# Patient Record
Sex: Female | Born: 1955 | Race: White
Health system: Northeastern US, Academic
[De-identification: ages and names within clinical notes are randomized; demographics above are authoritative.]

## PROBLEM LIST (undated history)

## (undated) DIAGNOSIS — M1990 Unspecified osteoarthritis, unspecified site: Secondary | ICD-10-CM

## (undated) DIAGNOSIS — R42 Dizziness and giddiness: Secondary | ICD-10-CM

## (undated) DIAGNOSIS — I341 Nonrheumatic mitral (valve) prolapse: Secondary | ICD-10-CM

## (undated) DIAGNOSIS — I607 Nontraumatic subarachnoid hemorrhage from unspecified intracranial artery: Secondary | ICD-10-CM

## (undated) DIAGNOSIS — F32A Depression, unspecified: Secondary | ICD-10-CM

## (undated) DIAGNOSIS — G4730 Sleep apnea, unspecified: Secondary | ICD-10-CM

## (undated) DIAGNOSIS — K648 Other hemorrhoids: Secondary | ICD-10-CM

## (undated) DIAGNOSIS — R55 Syncope and collapse: Secondary | ICD-10-CM

## (undated) DIAGNOSIS — Z9889 Other specified postprocedural states: Secondary | ICD-10-CM

## (undated) DIAGNOSIS — R112 Nausea with vomiting, unspecified: Secondary | ICD-10-CM

## (undated) DIAGNOSIS — N390 Urinary tract infection, site not specified: Secondary | ICD-10-CM

## (undated) DIAGNOSIS — D126 Benign neoplasm of colon, unspecified: Secondary | ICD-10-CM

## (undated) DIAGNOSIS — T8859XA Other complications of anesthesia, initial encounter: Secondary | ICD-10-CM

## (undated) HISTORY — DX: Urinary tract infection, site not specified: N39.0

## (undated) HISTORY — DX: Nonrheumatic mitral (valve) prolapse: I34.1

## (undated) HISTORY — DX: Nontraumatic subarachnoid hemorrhage from unspecified intracranial artery: I60.7

## (undated) HISTORY — DX: Benign neoplasm of colon, unspecified: D12.6

## (undated) HISTORY — DX: Other hemorrhoids: K64.8

## (undated) HISTORY — DX: Sleep apnea, unspecified: G47.30

## (undated) HISTORY — DX: Unspecified osteoarthritis, unspecified site: M19.90

## (undated) HISTORY — PX: OTHER SURGICAL HISTORY: SHX169

## (undated) HISTORY — DX: Depression, unspecified: F32.A

---

## 2003-04-05 DIAGNOSIS — G43909 Migraine, unspecified, not intractable, without status migrainosus: Secondary | ICD-10-CM

## 2003-04-05 DIAGNOSIS — L719 Rosacea, unspecified: Secondary | ICD-10-CM

## 2003-04-05 DIAGNOSIS — I341 Nonrheumatic mitral (valve) prolapse: Secondary | ICD-10-CM | POA: Insufficient documentation

## 2003-04-05 DIAGNOSIS — E663 Overweight: Secondary | ICD-10-CM | POA: Insufficient documentation

## 2003-04-05 HISTORY — DX: Migraine, unspecified, not intractable, without status migrainosus: G43.909

## 2003-04-05 HISTORY — DX: Rosacea, unspecified: L71.9

## 2007-02-22 DIAGNOSIS — G4730 Sleep apnea, unspecified: Secondary | ICD-10-CM | POA: Insufficient documentation

## 2010-07-01 ENCOUNTER — Encounter: Payer: Self-pay | Admitting: Gastroenterology

## 2010-07-01 ENCOUNTER — Emergency Department
Admission: EM | Admit: 2010-07-01 | Disposition: A | Discharge status: Home / Self Care | Payer: Self-pay | Source: Ambulatory Visit | Attending: Emergency Medicine | Admitting: Emergency Medicine

## 2010-07-01 ENCOUNTER — Ambulatory Visit: Payer: Self-pay | Admitting: Primary Care

## 2010-07-01 ENCOUNTER — Encounter: Payer: Self-pay | Admitting: Emergency Medicine

## 2010-07-01 HISTORY — DX: Syncope and collapse: R55

## 2010-07-01 LAB — URINE MICROSCOPIC (IQ200)
RBC,UA: <1 /HPF (ref 0–2)
WBC,UA: 4 /HPF (ref 0–5)

## 2010-07-01 LAB — URINALYSIS WITH REFLEX TO MICROSCOPIC
Blood,UA: NEGATIVE
Blood,UA: NEGATIVE
Ketones, UA: NEGATIVE
Ketones, UA: NEGATIVE
Leuk Esterase,UA: NEGATIVE
Nitrite,UA: POSITIVE — AB
Nitrite,UA: POSITIVE — AB
Protein,UA: NEGATIVE mg/dL
Protein,UA: NEGATIVE mg/dL
Specific Gravity,UA: 1.016 (ref 1.002–1.030)
Specific Gravity,UA: 1.01 (ref 1.002–1.030)
pH,UA: 6 (ref 5.0–8.0)
pH,UA: 5.5 (ref 5.0–8.0)

## 2010-07-01 LAB — CBC AND DIFFERENTIAL
Baso # K/uL: 0 THOU/uL (ref 0.0–0.1)
Basophil %: 0.3 % (ref 0.1–1.2)
Eos # K/uL: 0.1 THOU/uL (ref 0.0–0.4)
Eosinophil %: 1.8 % (ref 0.7–5.8)
Hematocrit: 40 % (ref 34–45)
Hemoglobin: 12.6 g/dL (ref 11.2–15.7)
Lymph # K/uL: 1.6 THOU/uL (ref 1.2–3.7)
Lymphocyte %: 20.7 % (ref 19.3–51.7)
MCV: 83 fL (ref 79–95)
Mono # K/uL: 0.5 THOU/uL (ref 0.2–0.9)
Monocyte %: 7 % (ref 4.7–12.5)
Neut # K/uL: 5.4 THOU/uL (ref 1.6–6.1)
Platelets: 252 THOU/uL (ref 160–370)
RBC: 4.8 MIL/uL (ref 3.9–5.2)
RDW: 15.5 % — ABNORMAL HIGH (ref 11.7–14.4)
Seg Neut %: 70.2 % (ref 34.0–71.1)
WBC: 7.7 THOU/uL (ref 4.0–10.0)

## 2010-07-01 LAB — RUQ PANEL (ED ONLY)
ALT: 19 U/L (ref 0–35)
AST: 15 U/L (ref 0–35)
Albumin: 4.2 g/dL (ref 3.5–5.2)
Alk Phos: 51 U/L (ref 35–105)
Amylase: 51 U/L (ref 28–100)
Bilirubin,Direct: <0.2 mg/dL (ref 0.0–0.3)
Bilirubin,Total: 0.4 mg/dL (ref 0.0–1.2)
Lipase: 31 U/L (ref 13–60)
Total Protein: 7.3 g/dL (ref 6.3–7.7)

## 2010-07-01 LAB — BASIC METABOLIC PANEL
Anion Gap: 12 (ref 7–16)
CO2: 24 mmol/L (ref 20–28)
Calcium: 8.5 mg/dL — ABNORMAL LOW (ref 8.6–10.2)
Chloride: 102 mmol/L (ref 96–108)
Creatinine: 0.59 mg/dL (ref 0.51–0.95)
GFR,Black: >59 *
GFR,Caucasian: >59 *
Glucose: 99 mg/dL (ref 74–106)
Lab: 13 mg/dL (ref 6–20)
Potassium: 4.2 mmol/L (ref 3.3–5.1)
Sodium: 138 mmol/L (ref 133–145)

## 2010-07-01 LAB — PROTIME-INR
INR: 0.9 (ref 0.9–1.1)
Protime: 12.2 s (ref 11.9–14.7)

## 2010-07-01 LAB — PREGNANCY, URINE: Preg Test,UR: NEGATIVE m[IU]/mL

## 2010-07-01 LAB — APTT: aPTT: 26.2 s (ref 22.3–35.3)

## 2010-07-01 LAB — TYPE AND SCREEN
ABO RH Blood Type: O POS
Antibody Screen: NEGATIVE

## 2010-07-01 MED ORDER — ONDANSETRON HCL 2 MG/ML IV SOLN *I*
INTRAMUSCULAR | Status: AC
Start: 2010-07-01 — End: 2010-07-01
  Administered 2010-07-01: 4 mg via INTRAVENOUS
  Filled 2010-07-01: qty 2

## 2010-07-01 MED ORDER — OXYCODONE-ACETAMINOPHEN 5-325 MG PO TABS *I*
1.0000 | ORAL_TABLET | ORAL | Status: AC | PRN
Start: 2010-07-01 — End: 2010-07-11

## 2010-07-01 MED ORDER — CIPROFLOXACIN HCL 500 MG PO TABS *I*
500.0000 mg | ORAL_TABLET | Freq: Two times a day (BID) | ORAL | Status: AC
Start: 2010-07-01 — End: 2010-07-04

## 2010-07-01 MED ORDER — SODIUM CHLORIDE 0.9 % IV BOLUS *I*
1000.0000 mL | Freq: Once | Status: DC
Start: 2010-07-01 — End: 2010-07-02
  Administered 2010-07-01 (×3): 1000 mL via INTRAVENOUS

## 2010-07-01 MED ORDER — OXYCODONE-ACETAMINOPHEN 5-325 MG PO TABS *I*
2.0000 | ORAL_TABLET | Freq: Once | ORAL | Status: AC
Start: 2010-07-01 — End: 2010-07-01
  Administered 2010-07-01: 2 via ORAL

## 2010-07-01 MED ORDER — CIPROFLOXACIN IN D5W 0.2 % IV SOLN *A*
400.0000 mg | Freq: Once | INTRAVENOUS | Status: DC
Start: 2010-07-01 — End: 2010-07-02
  Administered 2010-07-01: 400 mg via INTRAVENOUS
  Filled 2010-07-01: qty 200

## 2010-07-01 MED ORDER — PROMETHAZINE HCL 25 MG/ML IJ SOLN *I*
25.0000 mg | Freq: Once | INTRAMUSCULAR | Status: AC
Start: 2010-07-01 — End: 2010-07-01
  Administered 2010-07-01: 25 mg via INTRAVENOUS
  Filled 2010-07-01: qty 1

## 2010-07-01 MED ORDER — ONDANSETRON HCL 2 MG/ML IV SOLN *I*
4.0000 mg | Freq: Once | INTRAMUSCULAR | Status: AC
Start: 2010-07-01 — End: 2010-07-01

## 2010-07-01 MED ORDER — HYDROMORPHONE HCL 1 MG/ML IJ SOLN
1.0000 mg | Freq: Once | INTRAMUSCULAR | Status: AC
Start: 2010-07-01 — End: 2010-07-01
  Administered 2010-07-01: 1 mg via INTRAVENOUS
  Filled 2010-07-01: qty 1

## 2010-07-01 MED ORDER — OXYCODONE-ACETAMINOPHEN 5-325 MG PO TABS *I*
ORAL_TABLET | ORAL | Status: DC
Start: 2010-07-01 — End: 2010-07-02
  Filled 2010-07-01: qty 2

## 2010-07-01 NOTE — Progress Notes (Addendum)
 Reason For Visit   [name] is here for abdominal discomfort for 12 days duration, pain is   severe, with rash. [email or web address] lpn.  HPI   She reported: No fever, no chills, and no recent weight loss.  No chest   pain or discomfort.  No dyspnea.  No heartburn, no vomiting, no abdominal   swelling, no change in stool, no melena, no hematochezia, no diarrhea, and   no constipation.  No hematuria, no dysuria, and no burning sensation during   urination.  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  Allergies   Codeine Derivatives; Other.  Current Meds   Advil CAPS;TAKE AS DIRECTED.; RPT  Doxycycline Hyclate CAPS;TAKE AS DIRECTED.; RPT  Periostat CAPS;TAKE 1 CAPSULE DAILY; RPT.  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  ROS   Systemic symptoms: Not feeling fine.  Gastrointestinal symptoms: Decreased appetite.  No nausea.  Abdominal pain.    No epigastric pain  and not in the RUQ.  LUQ abdominal pain.  No RLQ   abdominal pain.  LLQ abdominal pain.  No periumbilical pain  and not   suprapubic.  No radiating abdominal pain.  Genitourinary symptoms: No urinary symptoms.  Vital Signs   Recorded by [email or web address] on 01 Jul 2010 08:07 AM  BP:132/86,  LUE,  Sitting,   HR: 72 b/min,  R Radial, Regular,   Resp: 16 r/min, Normal.  Physical Exam   Abdomen was soft there was exquisite  tenderness left upper abdomen and left   lower abdomen to palpation no focal mass.  The and was almost in tears on   palpation.  There is faint rash present but not vesicular nor  in a group   pattern nor in a dermatomal pattern to suggest shingles.  General appearance:    In acute distress.   Well-appearing.  Lungs:   Clear to auscultation.   No wheezing was heard.   No rhonchi were heard.  Cardiovascular system:  Heart Rate And Rhythm:  Normal.  Assessment   Pleasant female with no fever, temperature was 98.7, presentingwith 12 days   of abdominal pain worse over the last 12 to 18 hours to the point where she   is tearful every bump in the car caused discomfort urinalysis did show   nitrates blood leukoesterase negative and she has no urinary symptoms.  She   did have a bowel movement yesterday no change in bowel habits question of   possible diverticulitis or other,no fever present.  Patient referred to   [facility] for further evaluation and potential CT scan etc. and   pain control.  Communication nurse was at [facility] notified.  Greater than   50% of this 30 minute visit was spent counseling coordination of care    regarding this  issue as seen in my assessment and plan.  Thank you.  Signature   Electronically signed by: Tressie Stalker  M.D.; 07/01/2010 9:07 AM EST.  Electronically signed by: Tressie Stalker  M.D.; 07/01/2010 9:26 AM EST.

## 2010-07-01 NOTE — ED Provider Notes (Addendum)
 History   Chief Complaint   Patient presents with   . Abdominal Pain       HPI Comments: Kajuana Shareef is a 55 y.o. female presenting with L sided abdominal pain, L back / flank pain. Started 10 days prior. Initially thought pulled muscle and pain initially resolved with advil and heat pad, but then came back despite therapy. Pain remains L sided, flank and LLQ, pain always present, but waxes and wanes. No fevers / chills. No N/V/D. No urinary symptoms. BM's normal. Rash L flank starting Saturday after heating pad use. No hematuria.       Past Medical History   Diagnosis Date   . Migraine    . Syncope    . Aneurysm          Past Surgical History   Procedure Date   . Strabismus correction    . Cesarean section, classic          Family History   Problem Relation Age of Onset   . Cancer Mother    . Stroke Father    . Heart disease Father           reports that she has never smoked. She does not have any smokeless tobacco history on file.  She reports that she drinks alcohol.  She reports that she does not currently use illicit drugs.    Review of Systems   Review of Systems   Gastrointestinal: Positive for abdominal pain.   Genitourinary: Positive for flank pain.   Musculoskeletal: Positive for back pain.   All other systems reviewed and are negative.        Physical Exam   BP 191/83  Pulse 87  Temp(Src) 36.4 C (97.5 F) (Tympanic)  Resp 18  Ht 1.651 m (5\' 5" )  Wt 113.399 kg (250 lb)  BMI 41.60 kg/m2  SpO2 100%    Physical Exam   Nursing note and vitals reviewed.  Constitutional: She appears well-developed and well-nourished.        uncomfortable   HENT:   Head: Normocephalic and atraumatic.   Eyes: Conjunctivae are normal.   Neck: Normal range of motion. Neck supple. No tracheal deviation present.   Cardiovascular: Normal rate, regular rhythm and normal heart sounds.    Pulmonary/Chest: Effort normal and breath sounds normal. No respiratory distress. She has no wheezes. She has no rales.    Abdominal: Soft. Bowel sounds are normal. She exhibits no distension and no mass. Tenderness is present. She has no rebound and no guarding.        TTP LLQ.    Musculoskeletal: Normal range of motion. She exhibits edema. She exhibits no tenderness.        Trace BL LE edema   Neurological: She is alert.   Skin: Skin is warm and dry. Rash noted. There is erythema.        Macular erythematous rash L flank   Psychiatric: She has a normal mood and affect.       Medical Decision Making   MDM  Number of Diagnoses or Management Options  Diagnosis management comments: Patient seen by me today, 07/01/2010 at 10:17 AM    Assessment:  55 y.o., female comes to the ED with L sided abd pain / flank pain.   Differential Diagnosis includes diverticulitis, UTI, Stone, pregnancy, ovarian cyst  Plan: Labs, UA, Urine preg, CT abd, symptomatic therapy, reassess.          Langston Masker, MD  Patient seen  by me today, 07/01/2010 at 1030    History:   I reviewed this patient, reviewed the resident note and agree  Exam:   I examined this patient, reviewed the resident note and agree    Decision Making:   I discussed with the documented resident decision making  and agree          Author Joanna Puff, MD    Joanna Puff, MD  07/01/10 1052

## 2010-07-01 NOTE — ED Notes (Signed)
 Initial evaluation performed at [facility].  LABS, CT and ANALGESIA ordered.  Pt requires further evaluation.  55 year old female who presents with LUQ and LLQ abdominal pain, wrapping around into her back.  States has been having ongoing issues with back/side discomfort for the past few weeks, now severe x 12 hours.  No nausea/vomiting, diarrhea or constipation.  No fevers or urinary symptoms.  Saw PCP who referred her here for further evaluation.  Tearful in triage and appears to be in pain.  Ordered CT abd/pelvis, labs, urine, analgesics and saline.

## 2010-07-01 NOTE — ED Notes (Signed)
 ED RN INTERN ATTESTATION       I [name], RN (RN) reviewed the following charting information by the RN intern:DE    Nursing Assessments  Medications  Plan of Care  Teaching   Notes    In the chart of [name] (55 y.o. female) and attest to the charting being accurate.

## 2010-07-01 NOTE — ED Notes (Signed)
 Pt comes to ED complains of pain that starts in her left back and radiates around to her left abdomen which began approximately 10 days ago.  Pt is tender on palpation of left side of abdomen and makes no complaints of dizziness, light headedness, nausea or vomiting.  Pt states she has been "taking a lot of advil lately" but today it has not helped her pain.  Pt with IV placed, labs collected, NS bolus infusing and CT contrast at the bedside.  Pt was encouraged to continue to drink the contrast prior to testing.    Pt resting comfortably with husband at the bedside.

## 2010-07-01 NOTE — Discharge Instructions (Signed)
You were seen in the ED for left flank/abdominal pain.     Additional Information:     Summary of Procedures: None     Summary of Diagnostic Tests: Your CT scan, Ultrasound done in the ED showed gallstones and possible right ovarian functional cyst.       Take Antibiotics as prescribed for possible urinary tract infection and Percocet as prescribed for pain as needed. You should drink plenty of water and strain each time when you urinate.   Avoid lifting any heavy objects. Please come back to the ED immediately if symptoms gets worse, otherwise follow up with PCP in 1-2 days

## 2010-07-01 NOTE — ED Notes (Signed)
 L SIDED ABD. PAIN ON AND OFF FOR THERE PAST 10 DAYS.DENIES EMESIS OR DIARRHEA.

## 2010-07-01 NOTE — ED Notes (Addendum)
 Assumed care of pt. Introduced myself to pt and asked how she is feeling. Pt states she is starting to feel pain in her back again, however, pt quickly falls asleep after she answers questions. Respirations easy, regular. Will continue to monitor.

## 2010-07-01 NOTE — ED Notes (Signed)
 Bed:PA-01<BR> Expected date:07/01/10<BR> Expected time: 9:30 AM<BR> Means of arrival: Car<BR> Comments:<BR> ADULT SERVICE-----  Hamad, Jonesha-----  DOB [birth year]-----  [address]---PT WITH L ABD PAIN X 12 DAYS--HAS MILD RASH--PAIN WORSE OVER LAST 12-18 HRS-----NEEDS SCAN &amp; BLOOD WORK--------PM

## 2010-07-01 NOTE — ED Notes (Signed)
 Pt actively vomiting again despite IV phenergan given. Pt medicated per order before ultrasound. Pt states she only become nauseous when she gets up to move.will continue too monitor and treat per order.

## 2010-07-01 NOTE — ED Notes (Signed)
 ED RN INTERN ATTESTATION       I Victoria Dublin, RN (RN) reviewed the following charting information by the RN intern: HS    Nursing Assessments  Medications  Plan of Care  Teaching   Notes    In the chart of Irania [name] (55 y.o. female) and attest to the charting being accurate.

## 2010-07-01 NOTE — ED Notes (Cosign Needed)
 Pt feeling better, pt explained the blood and imaging results. Pt understands and wants to go home and will follow up with PCP and if symptoms worsen will come back to the ED.

## 2010-07-01 NOTE — ED Notes (Signed)
 ED RN INTERN ATTESTATION       I Bishop Dublin, RN (RN) reviewed the following charting information by the RN intern: HS    Nursing Assessments  Medications  Plan of Care  Teaching   Notes    In the chart of [name] (55 y.o. female) and attest to the charting being accurate.

## 2010-07-02 LAB — AEROBIC CULTURE

## 2010-07-04 ENCOUNTER — Ambulatory Visit: Payer: Self-pay | Admitting: Primary Care

## 2010-07-04 NOTE — Progress Notes (Signed)
 Reason For Visit   Patient in for a follow up to recent hosp dc.  HPI   Cc--f/u left lower back pain---went to ed 3 days ago--felt better with the   pain meds and nothing found by labs or ct scan, had an assymptomatic ct   scan--finds the pain is worse as day goes on and likely all   musculoskelatal, currently taking 1/2 percocet  3 times since left hosptal,   she is taking the naprosyn--pain is still at left flank        uti--never any symtpoms--finished a short course of cipro        right ovarian cyst--functional on the pelvic u/s.  Allergies   Codeine Derivatives; Other.  Current Meds   Advil CAPS;TAKE AS DIRECTED.; RPT  TraMADol HCl 50 MG Tablet;TAKE 1 TABLET 4 TIMES DAILY AS NEEDED FOR PAIN.;   Rx.  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  ROS   Systemic symptoms: No fever.  Cardiovascular symptoms: No chest pain or discomfort.  Pulmonary symptoms: No dyspnea.  Gastrointestinal symptoms: Normal appetite.  Nausea  with the percocet.  No   abdominal pain  and no change in stool.  Genitourinary symptoms: No urinary symptoms.  Vital Signs   Recorded by clatimer on 04 Jul 2010 02:45 PM  BP:127/68,  RUE,  Sitting,   HR: 67 b/min,  R Radial, Regular,   Weight: 267 lb.  Physical Exam   Left flank and left lower lateral ribs axillary line were tender to   palpation.  General appearance:   Well-appearing.   In no acute distress.  Lungs:   Clear to auscultation.   No wheezing was heard.   No rhonchi were heard.  Cardiovascular system:  Heart Rate And Rhythm:  Normal.  Assessment   Pleasant female seen here for follow-up of ED visit.     Asymptomatic UTI treated with Cipro.  She finished her last dose today she   never have urinary symptoms.     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  If  pain continues we will   get one more film specifically of her lateral lower ribs. however presently   she is improving we will have her take tramadol 50 mg up to every 6 hours   p.r.n. pain instead of Percocet hopefully she'll be less woozy with it. we   reviewed the lab work and studies done atthe hospital together.     Ovarian cyst stated to be functional she will confirm this with her GYN   when she makes a follow-up appointment in the near future.     She will schedule full physical with me as well.  Thank you.  Signature   Electronically signed by: Tressie Stalker  M.D.; 07/04/2010 3:20 PM EST.

## 2010-07-06 NOTE — Miscellaneous (Unsigned)
 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)   Diagnosis: Rosacea (695.3)   Diagnosis: Mild Sleep Apnea 22 Feb 2007 (780.57)   Diagnosis: Vitamin D Deficiency 23 Jul 2010 (268.9)   Problem: History of Complete Colonoscopy 05 Sep 2010    Family History  Fraternal history of Alcohol Abuse  Paternal history of Congestive Heart Failure  Daughter's history of Depression  Fraternal history of Depression  Fraternal history of Essential Hypertension  Maternal history of Malignant Carcinoma Of The Breast (V16.3)   Maternal history of Psychiatric Disorders  Paternal history of Stroke Syndrome (V17.1)     Alerts  Alert - Codeine Derivatives     Medications  Advil CAPS; TAKE AS DIRECTED. ; RPT   ALPRAZolam 0.5 MG Tablet; Take 1/2  to 1 tablet every 8 hours as needed ;   Rx   Calcium 500 MG Tablet; TAKE 1 TABLET TWICE DAILY ; RPT   Lexapro 10 MG Tablet; TAKE 1 TABLET DAILY. ; Rx   Vitamin D3 1000 UNIT Tablet; TAKE 1 TABLET DAILY. ; RPT     Immunizations  Td

## 2010-07-20 ENCOUNTER — Ambulatory Visit
Admit: 2010-07-20 | Discharge: 2010-07-20 | Disposition: A | Discharge status: Home / Self Care | Payer: Self-pay | Source: Ambulatory Visit | Attending: Primary Care | Admitting: Primary Care

## 2010-07-20 LAB — LIPID PANEL
Chol/HDL Ratio: 2.5
Cholesterol: 172 mg/dL
HDL: 69 mg/dL
LDL Calculated: 93 mg/dL
Non HDL Cholesterol: 103 mg/dL
Triglycerides: 50 mg/dL

## 2010-07-20 LAB — URINALYSIS WITH MICROSCOPIC
Blood,UA: NEGATIVE
Ketones, UA: NEGATIVE
Leuk Esterase,UA: NEGATIVE
Nitrite,UA: NEGATIVE
Protein,UA: 10 mg/dL — AB
RBC,UA: 1 /hpf (ref 0–2)
Specific Gravity,UA: 1.025 (ref 1.002–1.030)
WBC,UA: NONE SEEN /hpf (ref 0–5)
pH,UA: 6 (ref 5.0–8.0)

## 2010-07-20 LAB — AST: AST: 17 U/L (ref 0–35)

## 2010-07-20 LAB — ALT: ALT: 14 U/L (ref 0–35)

## 2010-07-23 ENCOUNTER — Ambulatory Visit: Payer: Self-pay | Admitting: Primary Care

## 2010-07-23 DIAGNOSIS — E559 Vitamin D deficiency, unspecified: Secondary | ICD-10-CM | POA: Insufficient documentation

## 2010-07-23 HISTORY — DX: Vitamin D deficiency, unspecified: E55.9

## 2010-07-23 LAB — VITAMIN D
25-OH VIT D2: <4 ng/mL
25-OH VIT D3: 15 ng/mL
25-OH Vit Total: 15 ng/mL — ABNORMAL LOW (ref 30–80)

## 2010-07-23 LAB — TSH: TSH: 1.23 u[IU]/mL (ref 0.27–4.20)

## 2010-07-23 NOTE — H&P (Signed)
 Reason For Visit   Pleasant female seen here for history and physical.  HPI   Pleasant female seen here for physical as well as review of several issues   with review to have a family history past medical history medications   allergies immunizations and lab work.     Patient complaining of some hair loss as well as weight gain though has not   been watching diet or exercising.  We will add a thyroid level to her   recent blood work.     Insomnia chronic issue wakes up at two to 3 in the morning trouble fa lling   back to sleep gets up at 5 in the morning generally.  Reviewed medication   options etc. she does not feel depressed or excessively anxious she is   willing to try something.  She has had a sleep study in the past which only   showed mild apnea. often wakes up and worries about family members.     corn on  left foot noticed by person doing pedicure.  She has tried corn   removal  pads without success.     Back pain resolved.     Migraines just on occasion Advil usually works well last and migraine where   she used Maxalt was over a year ago in the past she has seen neurology as   she will occasionally faint as part of her migraine episode especially if   she has poor sleep and increased stress.  Saw a Dr. Thurston Pounds  back in 2001.  Allergies   Codeine Derivatives; Other.  Current Meds   Advil CAPS;TAKE AS DIRECTED.; RPT  TraMADol HCl 50 MG Tablet;TAKE 1 TABLET 4 TIMES DAILY AS NEEDED FOR PAIN.;   Rx.  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  Family Hx   Fraternal history of Alcohol Abuse  Paternal history  of Congestive Heart Failure  Daughter's history of Depression  Fraternal history of Depression  Fraternal history of Essential Hypertension  Maternal history of Malignant Carcinoma Of The Breast  Maternal history of Psychiatric Disorders  Paternal history of Stroke Syndrome.  Personal Hx   Medical: No recent examination by a gynecologist  appt pending next month.    A recent examination by an ophthalmologist  and a breast self-exam was   performed.  Behavioral history: Daily coffee consumption was two cups per day.  No   tobacco use.  Alcohol: Alcohol  rare.  Drug use: Not using drugs.  Habits: Lack of adequate sleep  some decreased sleep due to stress and   using sunscreen.  Practicing good dental hygiene  and seeing a dentist   regularly.  Poor exercise habits  rec'd  most  days of the week.  Home environment: Housing with smoke detectors  and with Scientist, forensic.  Abuse / neglect: No abuse/neglect.  Work: Working full time  Oncologist.  Using seatbelts.  Marital: Currently married.  Sexual: Sexually active.  ROS   Systemic symptoms: Feeling fine  and not feeling tired (fatigue).  No  fever    and no chills.  Recent weight gain.  Eye symptoms: No vision problems.  Otolaryngeal symptoms: No hearing loss  and no earache.  No nasal symptoms.  Cardiovascular symptoms: No chest pain or discomfort, no palpitations, and   no intermittent leg claudication.  Pulmonary symptoms: No dyspnea, no cough, and no wheezing.  Gastrointestinal symptoms: Normal appetite.  Heartburn  rare, 1 time a   month.  No nausea, no vomiting, no abdominal pain, no diarrhea, and no   constipation.  Genitourinary symptoms: No hematuria  and no increase in urinary frequency.    No dysuria.  Endocrine symptoms: No polydipsia  and no excessive sweating.  Hematologic symptoms: No easy bleeding  and no tendency for easy bruising.  Musculoskeletal symptoms: No back pain  and no myalgias.  Pain localized to   one or more joints   left knee has been alittle sore lately.  Neurological symptoms: Dizziness  on occasion and a touch of vertigo.  No   lightheadedness  and no memory lapses or loss.  Psychological symptoms: No anxiety  but lots of stress and no depression.  Skin symptoms: No rash.  Localized skin lesions  left foot wart.  Immunizations   Td.  Health Mgmt Plan   Mammogram every 1 year; for HEALTH MAINTENANCE.  Vital Signs   Initial blood pressure by nurse 144/84 right arm 130/82 left arm.  Recorded by [email or web address] on 23 Jul 2010 10:09 AM  BP:136/84,  RUE,   HR: 84 b/min,   Resp: 14 r/min,   Height: 64.75 in, Weight: 266 lb, BMI: 44.6 kg/m2.  Physical Exam   GENERAL APPEARANCE: Normal  overweight  habitus. Well developed, well   groomed. Appears stated age. No acute distress. Color good.  MENTAL STATUS: Appears alert and oriented. Affect appropriate.  SKIN: Skin color and turgor normal. No suspicious lesions, masses, rashes,   or ulcerations.  small corn about two to 3 mm in diameter distal sole of   left foot debris with a 15 blade scalpel no complications  HEAD: Normocephalic.  EARS: External ear w/o scars, masses, or lesions. External auditory canal   intact, clear, and w/o lesions. TMs intact with normal light reflex and   landmarks. Acuity to conversational tones good.  EYES: PERRL, extraocular movements intact. Lids w/o defect, conjunctiva and   sclera appear normal. Fundi grossly normal  NOSE: Nasal mucosa and turbinates pink, septum midline, no lesions.  MOUTH: Teeth in good repair. Gums pink w/o lesions. Normal appearing   mucosa, palate, and tongue.   OROPHARYNX: Moist w/o exudate, erythema, or swelling.  NECK: Symmetric, trachea midline. Thyroid nontender w/o enlargement or   masses. Carotids no bruits. No cervical lymphadenopathy.  BREASTS--no retractions no masses to palpation no axillary adenopathy  CHEST:  Chest wall symmetric with no  masses. Breath sounds clear bilaterally w/o wheezes, rubs, rales, or   rhonchi.  CV:   Normal S1 and S2 w/o murmur, rub, gallop, or click.  No clubbing, or   cyanosis.  Trace nonpitting  bilateral lower extremity edema.  Dorsalis   pedis pulses full and symmetrical.  GI/ABDOMEN: Abdomen soft, obese with normal bowel sounds. No guarding or   rebound. No palpable masses or tenderness. Liver  w/o tenderness or   enlargement. No aortic widening.  GU and rectal deferred to GYN  MS: Muscle tone and strength normal for age, w/o atrophy or abnormal   movement.  EXTREMITIES: Joints w/full ROM, w/o tenderness, crepitus, or contracture.  No obvious joint deformities or effusions.  --left  knee has normal range   of motion without discomfort   No swelling no erythema no warmth  NEUROLOGICAL: Cranial nerves II-XII intact. Motor strength symmetrical with   no obvious weaknesses. Superficial sensation intact bilaterally to light   touch. Observed dexterity w/o ataxia or tremor. Deep tendon reflexes 1+ and   symmetric bilaterally.  Assessment   Pleasant female seen here for physical and review multiple issues.  We   reviewed lab work.     Insomnia she will try Remeron15 mg daily at bedtime if fails we may double   the dose or instead try generic Neurontin.  She will let me know she does.    I reviewed the dependency issues with Ambien and the like and also Valium   and that class of medication as well et Karie Soda.     Migraines continue Advil p.r.n..       hair loss and weight gain and poor sleep recommend regular aerobic exercise   in [employer].  We will check thyroid level.  EKG done revealed sinus   rhythm rate of 70 normal axis normal R-wave progression no acute ST-T wave   changes.     Pleasant female seen here for physical lab work reviewed thyroid no added   corn on left foot debrided.  DTaP given.  Colonoscopy appointment pending   GYN appointment pending mammogram up to date thank you.  Coun/Edu    --Health care proxy discussed and given to patient.   --Lab results discussed; see scanned  image  --Diet/body weight discussed  --Aerobic exercise discussed  --Alcohol use discussed  --Colon CA screening discussed.--appt pending  --OB/GYN care discussed-appt pending  --Breast self exam discussed.  --Mammogram

## 2010-08-28 DIAGNOSIS — F419 Anxiety disorder, unspecified: Secondary | ICD-10-CM

## 2010-08-28 HISTORY — DX: Anxiety disorder, unspecified: F41.9

## 2010-09-05 ENCOUNTER — Ambulatory Visit
Admit: 2010-09-05 | Discharge: 2010-09-05 | Disposition: A | Discharge status: Home / Self Care | Payer: Self-pay | Source: Ambulatory Visit | Attending: Gastroenterology | Admitting: Gastroenterology

## 2010-09-05 LAB — HM COLONOSCOPY

## 2010-09-08 LAB — SURGICAL PATHOLOGY

## 2010-09-12 ENCOUNTER — Encounter: Payer: Self-pay | Admitting: Primary Care

## 2010-09-12 ENCOUNTER — Ambulatory Visit: Payer: Self-pay | Admitting: Primary Care

## 2010-09-12 NOTE — Progress Notes (Signed)
 Reason For Visit   Pateint in with complaitn of anxiety and insomnia.  HPI   Pt here with cc  of stress and anxiety--she is feeling like she is losing   it and is tearful just to talk about it, sh has felt a few times where   whole body is jittery and in the past she had her own methods and would   take baths etc---she is stressed  about feeling stressed, she was raised to   not take medication and suck it up, she  has always felt a little bit this   way for yrs and always controlled it but now she is frightened and cannot   control her feelings, she does not want to lose her job, the hardest   concern is how her husband is dealing with her not doing well,---work has   gotten more stressed, has not slept well and at times does not want to get   up to go to work,patient denies feeling depressed and actually always feels   upbeat.  She does not feel sad her energy is generally good.  Her interests   are good.  She just currently feels that she is falling apart and is quite   anxious.   she almost quit her job due to stress and feeling like she is   losing it. She also did not like the generic Remeron as it made her too   fatigued for too long.     There is family history of her mom having had schizophrenia brother   alcoholism daughter with mental health illness as well.  She does not have   a lot of support overall from her husband regarding her own anxiety in   general at this time. she did feel relaxed when she had her colonoscopy and   I believe they gave her Versed.  Medications reviewed.  Allergies   Codeine Derivatives; Other.  Current Meds   Ergocalciferol 50000 UNIT Capsule;TAKE 1 CAPSULE WEEKLY for 8 weeks; Rx  ALPRAZolam 0.5 MG Tablet;Take 1/2  to 1 tablet every 8 hours as needed; Rx  Lexapro 10 MG Tablet;TAKE 1 TABLET DAILY.; Rx  Advil CAPS;TAKE AS DIRECTED.; RPT  Calcium 500 MG Tablet;TAKE 1 TABLET TWICE DAILY; RPT  Vitamin D3 1000 UNIT Tablet;TAKE 1 TABLET DAILY.; RPT.  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  ROS   Systemic symptoms: Not feeling fine.  No fever.  Cardiovascular symptoms: No chest pain or discomfort.  Pulmonary symptoms: No dyspnea.  Psychological symptoms: No depression.  Vital Signs   Recorded by clatimer on 12 Sep 2010 03:40 PM  BP:133/80,  LUE,  Sitting,   HR: 88 b/min,  L Radial, Irregular,   Weight: 259 lb.  Physical Exam   Pleasant female with him mildly tearful affect initially but normal speech   gait within normal limits overall quite conversive.  Assessment   Pleasant female seen here primarily with significant anxiety and perhaps   mild panic attacks reviewed with her all the options that are available to   her at this point.  She is well aware just from family mental health issues   as well.  I recommended alprazolam to be taken just as a p.r.n. for  increased anxiety and stress she can try half a tablet tonight.  Side   effects reviewed.  I recommended we try Lexapro 10 mg daily as a daily   medication to take down the anxiety level she is currently under. side   effects reviewed with her as well as the fact that if ineffective ortrouble   with the medication he can be stopped.  Her mood is good we are not using   it for depression.   Lastly I recommended we set her up with a psychologist   to help her with anxiety finding different avenues to manage the stressors.    We both reviewed our hope is with less anxiety and better sleep she will   feel better in general and have less anxiety.  Hopefully once seeing the   therapist regularly we can stop the Lexapro we cut the dose down.  She will   see me in one month greater than 50% of this 30 minute visit was spent   counseling coordination of care regarding the above issues as  seen in my    assessment plan.  Signature   Electronically signed by: Tressie Stalker  M.D.; 09/12/2010 5:04 PM EST.

## 2010-10-09 ENCOUNTER — Encounter: Payer: Self-pay | Admitting: Primary Care

## 2010-10-09 ENCOUNTER — Ambulatory Visit: Payer: Self-pay | Admitting: Primary Care

## 2010-10-09 NOTE — Progress Notes (Signed)
 Reason For Visit   Patient in for a follow up exam.  HPI   Several issues reviewed     anxiety--meds reviewed--initially emesis with it but likely was a gi bug   and now taking it regularly, she does feel a touch calmer and feels a   little more sad, she has been seeing psychologist weekly  and hard to tell   if helping a lot,has only had a couple panic attacks hus far, only takes it   if feels like about to get that panic attack, sleep has not improved   yet,still issues of staying asleep, she is under more stress since last   here,at work they plan to take away her dept from her,she had actually been   considering resigning in the past from the stress, mood is 4-5/10, still   some episodes of cryng and sadness, only recently has she had the time to   think about the difficult past. not getting out and exercising a lot,   patient is not suicidalno thoughts of hurting self.        she has had a chronic knee issue for a long time, no prior injury, left   knee is the issue--he never gets swollen or hot but often will get tender   she does a lot of walking. no prior injury that she recalls occasional take   some Advil.  Allergies   Codeine Derivatives; Other.  Current Meds   Advil CAPS;TAKE AS DIRECTED.; RPT  Lexapro 10 MG Tablet;TAKE 1 TABLET DAILY.; Rx  ALPRAZolam 0.5 MG Tablet;Take 1/2  to 1 tablet every 8 hours as needed; Rx  Ergocalciferol 50000 UNIT Capsule;TAKE 1 CAPSULE WEEKLY for 8 weeks; Rx  Vitamin D3 1000 UNIT Tablet;TAKE 1 TABLET DAILY.; RPT  Calcium 500 MG Tablet;TAKE 1 TABLET TWICE DAILY; RPT.  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  ROS   Systemic symptoms: Feeling fine.  Cardiovascular symptoms: No chest pain or discomfort.  Gastrointestinal symptoms: No abdominal  pain.  Neurological symptoms: No dizziness  and no lightheadedness.  Vital Signs   Recorded by clatimer on 09 Oct 2010 01:33 PM  BP:132/76,  LUE,  Sitting,   HR: 74 b/min,  L Radial, Regular,   Weight: 258 lb.  Physical Exam   Left knee reveal no swelling or erythema no warmth there is positive point   tenderness to the left anserine bursa which upon palpation reproduced her   symptoms.  General appearance:   Well-appearing.  Lungs:   Clear to auscultation.   No wheezing was heard.   No rhonchi were heard.  Cardiovascular system:  Heart Rate And Rhythm:  Normal.  Edema:  Not present.  Assessment   Pleasant female seen here for follow-up of anxiety and panic now found to   be more depressed as well.  She is seeing a therapist regularly she will   call me in a couple weeks and if mood is not improving we will increase   Lexapro to 20 mg daily.  She has enough of alprazolam  left but she only   rarely takes half a tablet.  I encouraged her to try to exercise regularly   and lose weight.  The alprazolam works well for any acute  panic attacks.   f/u in 1 month  Left knee pain chronic intermittent issue she may continue Advil next visit   we will try a steroid shot if she is interested.  Greater than 50% of this   25 minute visit was spent counseling coordination of care regarding the   above issues as seen in my assessment and plan.  Thank you.  Signature   Electronically signed by: Tressie Stalker  M.D.; 10/09/2010 2:02 PM EST.

## 2010-11-07 ENCOUNTER — Ambulatory Visit: Payer: Self-pay | Admitting: Primary Care

## 2010-11-07 VITALS — BP 146/80 | HR 74 | Wt 257.0 lb

## 2010-11-07 DIAGNOSIS — M25562 Pain in left knee: Secondary | ICD-10-CM

## 2010-11-07 DIAGNOSIS — F32A Depression, unspecified: Secondary | ICD-10-CM

## 2010-11-07 DIAGNOSIS — M1712 Unilateral primary osteoarthritis, left knee: Secondary | ICD-10-CM | POA: Insufficient documentation

## 2010-11-07 MED ORDER — ESCITALOPRAM OXALATE 20 MG PO TABS *I*
20.0000 mg | ORAL_TABLET | Freq: Every day | ORAL | Status: DC
Start: 2010-11-07 — End: 2010-12-24

## 2010-11-07 NOTE — Progress Notes (Signed)
 Chief complaint:        HPI:pt here for f/u    Mood--doing okay, has stayed on 10 mg lexapro, work has been stressful with her selfesteem, she is seeing the psychologist, not crying anymore,mood is about a 6/10, sleep is better, no thoughts of hurting self or others, energy pretty good, still being withdrawn, willing to try the 20mg  lexapro    Left knee pain-chronic off and on for 6-8 yrs, she has fallen on it several times--even in college, older sister and bro have  Knee djd, never gets hot or red , most pain has been on medial side, she has not seen anyone for it, takes ibuprofen for it      Review of systems:  Review of systems    Feeling fine  No fever  No headache  No chest pain  No shortness of breath  No cough  No change of appetite  No nausea  No vomiting  No abdominal pain  No stool changes  No urinary symptoms  No lightheadedness  No skin rashes        Physical exam:Gen: patient appears comfortable and in no distress  Cv: RRR, Nl S1,S2  Resp: CTA b/l, no rales, no rhonchi  Ext: no edema     Left knee revealed no swelling no erythema no warmth passive range of motion was smooth no pain with varus valgus stressing of the knee some discomfort to palpation over the left anserine bursa but this did not reproduce the same type of pain that she will get.  Normal strength with knee flexion and extension without discomfort.          Assessment and plan: pleasant female seen here for a couple issues.    Depression improving seeing her psychologist willing to try 20 mg generic Lexapro which I wrote for ,she'll see me again in about 6 weeks sooner if needed she seems to be in better spirits overall.    Left knee pain intermittent for years I suspect she may well have some mild arthritis however I wonder if there may be a loose body present that she will have some days and weeks where she is perfect and other times she will be limping and in a lot of discomfort.  She may continue 600 mg ibuprofen 3-4 times a day with  food.  We will refer her to orthopedics.

## 2010-12-03 ENCOUNTER — Encounter: Payer: Self-pay | Admitting: Primary Care

## 2010-12-03 ENCOUNTER — Ambulatory Visit: Payer: Self-pay | Admitting: Sports Medicine

## 2010-12-03 ENCOUNTER — Encounter: Payer: Self-pay | Admitting: Gastroenterology

## 2010-12-03 ENCOUNTER — Encounter: Payer: Self-pay | Admitting: Sports Medicine

## 2010-12-03 VITALS — BP 130/96 | Ht 65.0 in | Wt 255.0 lb

## 2010-12-03 DIAGNOSIS — M25562 Pain in left knee: Secondary | ICD-10-CM

## 2010-12-03 DIAGNOSIS — IMO0002 Reserved for concepts with insufficient information to code with codable children: Secondary | ICD-10-CM | POA: Insufficient documentation

## 2010-12-03 DIAGNOSIS — M1710 Unilateral primary osteoarthritis, unspecified knee: Secondary | ICD-10-CM | POA: Insufficient documentation

## 2010-12-03 HISTORY — DX: Unilateral primary osteoarthritis, unspecified knee: M17.10

## 2010-12-03 NOTE — Progress Notes (Signed)
 Dictated

## 2010-12-04 NOTE — Progress Notes (Signed)
 [name] is seen in referral from Dr. Tressie Stalker for evaluation of left  knee. She has pain and stiffness and states that it feels as if it may give  out. She has difficulty with walking at times. She does have a mild  numbness and weakness in the lower extremity secondary to a previous  cerebral aneurysm and she feels that this is contributing to the giving  way; however, the knee is her primary concern. She has had no specific  treatments.    Past medical history, medications, past surgical history, and allergies are  all documented in the office chart, marked as reviewed.    SOCIAL HISTORY: Married. 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    REVIEW OF SYSTEMS: Numbness, rosacea, and heart murmur.    PHYSICAL EXAMINATION: Examination demonstrates a pleasant 5 foot 5, 255  pound individual who sits in no apparent distress. Supple motion in the  neck in all planes. No evidence of external skin lesions bilateral lower  extremities. Focused examination of the left lower extremity demonstrates  hip and ankle range of motion is pain free. Left knee range is 0 to 130.  Crepitance is noted. She has medial greater than lateral pain on palpation  as well as circumduction. She is ligamentously stable to Lachman, posterior  drawer, and varus and valgus.    RADIOGRAPHS: Radiographs obtained today and reviewed by me demonstrate that  there is approximately 50% joint space narrowing medially, as well as  evidence of tricompartmental early changes.    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  Symptomatic at this point. Would benefit from weight loss, and this was  discussed, as well as physical therapy for stability program and  conditioning. Finally, we discussed a corticosteroid injection as a first  line treatment. She consents to this and after appropriate sterile prep a  single injection of 80 mg of Depo-Medrol with 4 cc of 1% lidocaine are  instilled into the left knee without  difficulty. Follow up in 2 to 3 months  to discuss her response and future treatments.                Electronically Signed and Finalized  by  Verita Schneiders, MD 12/04/2010 19:05  ___________________________________________  Verita Schneiders, MD      DD:   12/04/2010  DT:   12/04/2010  7:56 A  [id number]  [id number]    cc:   Gwenette Greet, MD

## 2010-12-10 ENCOUNTER — Encounter: Payer: Self-pay | Admitting: Gastroenterology

## 2010-12-24 ENCOUNTER — Ambulatory Visit: Payer: Self-pay | Admitting: Primary Care

## 2010-12-24 VITALS — BP 145/90 | HR 68 | Wt 255.0 lb

## 2010-12-24 DIAGNOSIS — F32A Depression, unspecified: Secondary | ICD-10-CM

## 2010-12-24 MED ORDER — ESCITALOPRAM OXALATE 10 MG PO TABS *I*
10.0000 mg | ORAL_TABLET | Freq: Every day | ORAL | Status: DC
Start: 2010-12-24 — End: 2011-07-13

## 2010-12-24 MED ORDER — BUPROPION HCL 150 MG PO TB24 *I*
150.0000 mg | ORAL_TABLET | Freq: Every morning | ORAL | Status: DC
Start: 2010-12-24 — End: 2011-05-14

## 2010-12-24 NOTE — Progress Notes (Signed)
CC--, f/u of mood etc          HPI--mood --better but some fatigue with the lexapro, 6/10, seeing counsellor, fatigued with the higher, feels groggy and mild ha since dose was increased, not crying,  Conc is generally better,no longer anhedonia, appetite is same, less panicky, no thoughts of hurting self, work is still stressful    Knee pain--? Chronic tear etc but OA--doing PT  And seeing ortho        Review of systems    Feeling fine  No fever  No chest pain  No shortness of breath  No cough  No change of appetite  No nausea  No vomiting  No abdominal pain  No stool changes  No urinary symptoms   mild dizziness with PT            Current Outpatient Prescriptions   Medication Sig Dispense Refill   . escitalopram (LEXAPRO) 20 MG tablet Take 1 tablet (20 mg total) by mouth daily    30 tablet  3   . ALPRAZolam (XANAX) 0.5 MG tablet Take 1/2  to 1 tablet every 8 hours as needed  30  0   . Calcium 500 MG tablet TAKE 1 TABLET TWICE DAILY    0   . Cholecalciferol (VITAMIN D3) 1000 UNIT tablet TAKE 1 TABLET DAILY.    0   . Ibuprofen (ADVIL PO) Take  by mouth.       . Rizatriptan Benzoate (MAXALT PO) Take  by mouth.             Filed Vitals:    12/24/10 0832   BP: 145/90   Pulse: 68   Weight: 115.667 kg (255 lb)         EXAM  Gen: patient appears comfortable and in no distress  Cv: RRR, Nl S1,S2  Resp: CTA b/l, no rales, no rhonchi  Ext: no edema          A/P and female doing better mood wise we will change her back down to 10 mg generic Lexapro due to fatigue and add Wellbutrin 150 mg once a day she followup in a couple months sooner if needed.  She's clearly not suicidal and feels much better about things she'll continue to see a therapist.    Knee doing better overall physical therapy and has seen orthopedics.    Blood pressure a touch elevated she is aware if it is elevated at followup visit we will likely start blood pressure medication hence we need her to try and lose some weight in the near future.  She is in  agreement.  And would like to hold off on medication for now.

## 2011-02-10 ENCOUNTER — Ambulatory Visit: Payer: Self-pay | Admitting: Sports Medicine

## 2011-02-11 ENCOUNTER — Encounter: Payer: Self-pay | Admitting: Gastroenterology

## 2011-02-13 ENCOUNTER — Ambulatory Visit: Payer: Self-pay | Admitting: Orthopedic Surgery

## 2011-02-13 ENCOUNTER — Encounter: Payer: Self-pay | Admitting: Orthopedic Surgery

## 2011-02-13 VITALS — BP 149/73 | Ht 65.0 in | Wt 255.0 lb

## 2011-02-13 DIAGNOSIS — M1710 Unilateral primary osteoarthritis, unspecified knee: Secondary | ICD-10-CM

## 2011-02-13 NOTE — Progress Notes (Signed)
Dictated

## 2011-02-15 NOTE — Progress Notes (Signed)
CHIEF COMPLAINT:  Follow-up for the left knee.    INTERVAL HISTORY:  The patient reports that she is still having some pain  in her knee, but it is feeling stronger with physical therapy.  She thinks  that has been pretty helpful.  She did have a corticosteroid injection at  last visit about 2-1/2 months ago.  It helped for a very short time.    PHYSICAL EXAMINATION:  Exam of her knee reveals that she still has  tenderness primarily along the medial joint line and also some  patellofemoral crepitation and irritability.    ASSESSMENT:  She still has pretty significant medial joint line tenderness.  I offered her a corticosteroid injection repeat today because she is going  on vacation.  She is in agreement.    PROCEDURE:  Under sterile precautions I injected the left knee using 12 mg  of Celestone and 8 cc of 1% Xylocaine.  She tolerated the procedure well  and was given post-injection instructions.    PLAN:  We will see her back in the office in 6 weeks.  If her symptoms have  not significantly improved by then, perhaps an MRI would be helpful in  further defining the problem.      Dictated by:  Frances Nickels, PA      Co-signature.    Electronically Signed and Finalized  by  Verita Schneiders, MD 02/18/2011 08:02  ___________________________________________  Verita Schneiders, MD      DD:   02/13/2011  DT:   02/15/2011  2:41 A  [id number]  [id number]    cc:   Gwenette Greet, MD

## 2011-03-06 ENCOUNTER — Ambulatory Visit: Payer: Self-pay | Admitting: Orthopedic Surgery

## 2011-03-23 ENCOUNTER — Ambulatory Visit: Payer: Self-pay | Admitting: Primary Care

## 2011-03-23 DIAGNOSIS — F32A Depression, unspecified: Secondary | ICD-10-CM

## 2011-03-23 NOTE — Progress Notes (Signed)
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,enjoying work better, mood7-8/10, sleep is no better, still trouble staying asleep, awakens every 2-3 hours--she does fallback to sleep easily, things are okay at home , mom has not been well, she is seeing her mom for first time in 3 yrs this weekend, conc and energy is better,        Review of systems      No fever  Minimal stress headaches and starting new job  No chest pain  No shortness of breath  No cough  No change of appetite  No nausea  No vomiting  No abdominal pain  No stool changes  No urinary symptoms  No dizziness  No lightheadedness  No skin rashes        Current Outpatient Prescriptions   Medication Sig Dispense Refill   . buPROPion (WELLBUTRIN XL) 150 MG 24 hr tablet Take 1 tablet (150 mg total) by mouth every morning   Swallow whole. Do not crush, break, or chew.  30 tablet  3   . escitalopram (LEXAPRO) 10 MG tablet Take 1 tablet (10 mg total) by mouth daily    30 tablet  3   . Calcium 500 MG tablet TAKE 1 TABLET TWICE DAILY    0   . Cholecalciferol (VITAMIN D3) 1000 UNIT tablet TAKE 1 TABLET DAILY.    0   . Ibuprofen (ADVIL PO) Take  by mouth.       . Rizatriptan Benzoate (MAXALT PO) Take  by mouth.             Filed Vitals:    03/23/11 0719   BP: 132/82   Pulse: 60   Weight: 117.935 kg (260 lb)         EXAM  Gen: patient appears comfortable and in no distress  Cv: RRR, Nl S1,S2  Resp: CTA b/l, no rales, no rhonchi  Ext: no edema          A/P pleasant female seen here for mood followup doing better overall she will continue generic Lexapro and Wellbutrin he'll try and decrease calorie intake and try and stay aerobically active she seeing orthopedics tomorrow may not getting an MRI on her knee etc. she will see me in 3-4 months sooner if needed I reviewed with her we will likely keep her on both medications for a total of 9 months of good mood.  She's not suicidal she's improved she does have a stressful  event coming up this week and seeing her mother for the first time in 3 years.  Sincerely Gasper Lloyd. Iacobuccigreater than 50% of this 20 minute visit was spent counseling coordination of care regarding above issue as seen in my assessment and plan.

## 2011-03-24 ENCOUNTER — Encounter: Payer: Self-pay | Admitting: Sports Medicine

## 2011-03-24 ENCOUNTER — Ambulatory Visit: Payer: Self-pay | Admitting: Sports Medicine

## 2011-03-24 DIAGNOSIS — M1710 Unilateral primary osteoarthritis, unspecified knee: Secondary | ICD-10-CM

## 2011-03-24 DIAGNOSIS — IMO0002 Reserved for concepts with insufficient information to code with codable children: Secondary | ICD-10-CM

## 2011-03-24 MED ORDER — NABUMETONE 750 MG PO TABS *I*
750.0000 mg | ORAL_TABLET | Freq: Two times a day (BID) | ORAL | Status: DC
Start: 2011-03-24 — End: 2011-07-08

## 2011-03-24 NOTE — Progress Notes (Signed)
Dictated

## 2011-03-26 ENCOUNTER — Encounter: Payer: Self-pay | Admitting: Gastroenterology

## 2011-03-26 NOTE — Progress Notes (Signed)
[name] is seen in follow-up for re-evaluation of her left knee.  She has  had a corticosteroid injection at the last visit 02/13/2011 for  medial-based arthritis estimated 50% narrowing.  She does have persistent  improvement in her symptoms; however, she does have moderate discomfort  medially.  She is here to discuss the diagnosis and treatment options.    PHYSICAL EXAMINATION:  Examination demonstrates range is 0 to 120 degrees.  Ligament exam is stable.  Diffuse medial-based retinacular tenderness and  medial joint line tenderness.    RADIOGRAPHS:  Again radiographs are reviewed demonstrating greater than 50%  medial joint space narrowing.    ASSESSMENT:  Moderate medial arthritis and possible meniscus tear.  The  options were discussed.  These include intermittent corticosteroid  injections, viscosupplementation, consideration for arthroscopy.  At this  point, we will start with a simple addition of Relafen 750 mg b.i.d.  Repeat injection in 6 to 8 weeks if she feels it is necessary.  We  discussed in detail arthroscopic intervention with the goals as well as  expected time course for recovery.  If she would like to proceed to an  arthroscopy, she will let me know, otherwise I will see her back in 6 to 8  weeks for consideration of repeat corticosteroid and consider a trial of  viscosupplementation.                Electronically Signed and Finalized  by  Verita Schneiders, MD 03/27/2011 18:15  ___________________________________________  Verita Schneiders, MD      DD:   03/24/2011  DT:   03/26/2011  6:56 A  [id number]  [id number]    cc:

## 2011-04-22 ENCOUNTER — Encounter: Payer: Self-pay | Admitting: Gastroenterology

## 2011-05-07 ENCOUNTER — Encounter: Payer: Self-pay | Admitting: Sports Medicine

## 2011-05-07 ENCOUNTER — Ambulatory Visit: Payer: Self-pay | Admitting: Sports Medicine

## 2011-05-07 VITALS — BP 147/68 | Ht 65.0 in | Wt 250.0 lb

## 2011-05-07 DIAGNOSIS — M1710 Unilateral primary osteoarthritis, unspecified knee: Secondary | ICD-10-CM

## 2011-05-07 NOTE — Progress Notes (Signed)
Dictated

## 2011-05-08 NOTE — Progress Notes (Signed)
[name] is seen in follow-up for reexamination of her left knee. She has 50%  joint space narrowing in the medial compartment. We had injected the knee  in August and had a transient response. She is here to discuss alternate  options.    PHYSICAL EXAMINATION: Examination demonstrates range is 0 to 120.  Crepitance is present, medial greater than lateral joint line tenderness,  worse with circumduction.    ASSESSMENT: Left knee chondromalacia and meniscus tear. Symptomatic medial  based pain, not responding to conservative options. Quite young in age and  not yet ready for arthroplasty. We have discussed arthroscopic options to  include chondroplasty and partial meniscectomy as indicated. We have  discussed the risks and benefits including the potential for incomplete  relief of symptoms. She at this point is ready to proceed with surgical  intervention recognizing that she may need alternate interventions  postoperatively including potential postoperative injection therapy. She  will look toward her schedule and she will phone the office to schedule and  I will see her back at the time of surgery.                Electronically Signed and Finalized  by  Verita Schneiders, MD 05/11/2011 19:25  ___________________________________________  Verita Schneiders, MD      DD:   05/08/2011  DT:   05/08/2011  7:25 A  AOZ/[id number]  [id number]    cc:   Gwenette Greet, MD

## 2011-05-14 ENCOUNTER — Other Ambulatory Visit: Payer: Self-pay | Admitting: Primary Care

## 2011-06-15 ENCOUNTER — Encounter: Payer: Self-pay | Admitting: Sports Medicine

## 2011-06-18 ENCOUNTER — Encounter: Payer: Self-pay | Admitting: Sports Medicine

## 2011-06-18 ENCOUNTER — Ambulatory Visit
Admit: 2011-06-18 | Discharge: 2011-06-18 | Disposition: A | Discharge status: Home / Self Care | Payer: Self-pay | Source: Ambulatory Visit | Attending: Sports Medicine | Admitting: Sports Medicine

## 2011-06-18 HISTORY — DX: Dizziness and giddiness: R42

## 2011-06-19 ENCOUNTER — Ambulatory Visit
Admit: 2011-06-19 | Discharge: 2011-06-19 | Disposition: A | Discharge status: Home / Self Care | Payer: Self-pay | Source: Ambulatory Visit | Attending: Sports Medicine | Admitting: Sports Medicine

## 2011-06-19 HISTORY — DX: Other complications of anesthesia, initial encounter: T88.59XA

## 2011-06-19 LAB — POCT URINE PREGNANCY: Lot #: 708187

## 2011-06-19 MED ORDER — FENTANYL CITRATE 50 MCG/ML IJ SOLN *WRAPPED*
INTRAMUSCULAR | Status: AC
Start: 2011-06-19 — End: 2011-06-19
  Filled 2011-06-19: qty 2

## 2011-06-19 MED ORDER — CEFAZOLIN SODIUM 1000 MG IJ SOLR *I*
2000.0000 mg | INTRAMUSCULAR | Status: DC
Start: 2011-06-19 — End: 2011-06-19

## 2011-06-19 MED ORDER — OXYCODONE-ACETAMINOPHEN 5-325 MG/5ML PO SOLN *A*
5.0000 mL | Freq: Once | ORAL | Status: AC | PRN
Start: 2011-06-19 — End: 2011-06-19

## 2011-06-19 MED ORDER — HYDROCODONE-ACETAMINOPHEN 5-500 MG PO TABS
1.0000 | ORAL_TABLET | ORAL | Status: DC | PRN
Start: 2011-06-19 — End: 2011-07-01

## 2011-06-19 MED ORDER — MIDAZOLAM HCL 1 MG/ML IJ SOLN *I* WRAPPED
INTRAMUSCULAR | Status: AC
Start: 2011-06-19 — End: 2011-06-19
  Filled 2011-06-19: qty 2

## 2011-06-19 MED ORDER — ALBUTEROL SULFATE (2.5 MG/3ML) 0.083% IN NEBU *I*
2.5000 mg | INHALATION_SOLUTION | Freq: Once | RESPIRATORY_TRACT | Status: AC | PRN
Start: 2011-06-19 — End: 2011-06-19

## 2011-06-19 MED ORDER — LIDOCAINE HCL (XYLOCAINE) 20 MG/ML IV/IJ SOLN WRAPPED *I*
Status: AC
Start: 2011-06-19 — End: 2011-06-19
  Filled 2011-06-19: qty 5

## 2011-06-19 MED ORDER — HALOPERIDOL LACTATE 5 MG/ML IJ SOLN *I*
1.0000 mg | Freq: Once | INTRAMUSCULAR | Status: AC | PRN
Start: 2011-06-19 — End: 2011-06-19

## 2011-06-19 MED ORDER — ONDANSETRON HCL 2 MG/ML IV SOLN *I*
INTRAMUSCULAR | Status: AC
Start: 2011-06-19 — End: 2011-06-19
  Filled 2011-06-19: qty 2

## 2011-06-19 MED ORDER — PROMETHAZINE HCL 25 MG/ML IJ SOLN *I*
INTRAMUSCULAR | Status: AC
Start: 2011-06-19 — End: 2011-06-19
  Filled 2011-06-19: qty 1

## 2011-06-19 MED ORDER — ONDANSETRON HCL 2 MG/ML IV SOLN *I*
1.0000 mg | Freq: Once | INTRAMUSCULAR | Status: AC | PRN
Start: 2011-06-19 — End: 2011-06-19

## 2011-06-19 MED ORDER — PROPOFOL 10 MG/ML IV EMUL (INTERMITTENT DOSING) WRAPPED *I*
INTRAVENOUS | Status: AC
Start: 2011-06-19 — End: 2011-06-19
  Filled 2011-06-19: qty 50

## 2011-06-19 MED ORDER — HYDROMORPHONE HCL PF 1 MG/ML IJ SOLN *WRAPPED*
0.5000 mg | INTRAMUSCULAR | Status: DC | PRN
Start: 2011-06-19 — End: 2011-06-20

## 2011-06-19 MED ORDER — SCOPOLAMINE BASE 1.5 MG TD PT72 *I*
MEDICATED_PATCH | TRANSDERMAL | Status: AC
Start: 2011-06-19 — End: 2011-06-19
  Filled 2011-06-19: qty 1

## 2011-06-19 MED ORDER — CEFAZOLIN SODIUM 1000 MG IJ SOLR *I*
INTRAMUSCULAR | Status: AC
Start: 2011-06-19 — End: 2011-06-19
  Filled 2011-06-19: qty 10

## 2011-06-19 MED ORDER — LIDOCAINE HCL 1 % IJ SOLN
0.1000 mL | Freq: Once | INTRAMUSCULAR | Status: DC | PRN
Start: 2011-06-19 — End: 2011-06-19
  Administered 2011-06-19: 0.1 mL via SUBCUTANEOUS

## 2011-06-19 MED ORDER — DEXAMETHASONE SODIUM PHOSPHATE 4 MG/ML IJ SOLN
INTRAMUSCULAR | Status: AC
Start: 2011-06-19 — End: 2011-06-19
  Filled 2011-06-19: qty 1

## 2011-06-19 MED ORDER — ASPIRIN 325 MG PO TBEC
325.0000 mg | DELAYED_RELEASE_TABLET | Freq: Every day | ORAL | Status: DC
Start: 2011-06-19 — End: 2011-07-08

## 2011-06-19 MED ORDER — OXYCODONE-ACETAMINOPHEN 5-325 MG/5ML PO SOLN *A*
ORAL | Status: AC
Start: 2011-06-19 — End: 2011-06-19
  Filled 2011-06-19: qty 5

## 2011-06-19 MED ORDER — LACTATED RINGERS IV SOLN *I*
20.0000 mL/h | INTRAVENOUS | Status: DC
Start: 2011-06-19 — End: 2011-06-19
  Administered 2011-06-19 (×2): 20 mL/h via INTRAVENOUS

## 2011-06-19 MED ORDER — DEXTROSE IN LACTATED RINGERS 5 % IV SOLN *I*
100.0000 mL/h | INTRAVENOUS | Status: DC
Start: 2011-06-19 — End: 2011-06-20

## 2011-06-19 MED ORDER — PROMETHAZINE HCL 25 MG/ML IJ SOLN *I*
6.2500 mg | INTRAMUSCULAR | Status: DC | PRN
Start: 2011-06-19 — End: 2011-06-20
  Administered 2011-06-19: 6.25 mg via INTRAVENOUS

## 2011-06-19 NOTE — Discharge Instructions (Signed)
Due to the sedation medication and or general anesthesia you have been given today, please follow these discharge instructions:    [x]  Do not drive or operate any machinery for 24 hours or the specified time frame that was recommended by your doctor, please refer to post op instructions.  [x]  Do not drink any alcoholic beverages for 24 hours after your procedure and/or if you are taking a narcotic pain reliever (e.g. Vicodin, Percocet or Tylenol #3).  [x]  Do not make any major decisions or sign contracts for 24 hours.  [x]  Prescription information given to patient and/or patient representative.    Diet: begin with liquids, advance as tolerated.    Your next dose of pain medication is due after: When needed.    If unable to reach your doctor at [phone], call 911 for a true emergency or go to your nearest emergency department.    It is our recommendation that you to have someone stay with you for 24 hours following your procedure.

## 2011-06-20 ENCOUNTER — Encounter: Payer: Self-pay | Admitting: Primary Care

## 2011-06-24 MED FILL — Bupivacaine HCl Preservative Free (PF) Inj 0.25%: INTRAMUSCULAR | Qty: 30 | Status: AC

## 2011-06-24 MED FILL — Lidocaine Inj 1% w/ Epinephrine-1:100000: INTRAMUSCULAR | Qty: 50 | Status: AC

## 2011-06-24 MED FILL — Epinephrine HCl Inj 1 MG/ML: INTRAMUSCULAR | Qty: 1 | Status: AC

## 2011-07-01 ENCOUNTER — Encounter: Payer: Self-pay | Admitting: Sports Medicine

## 2011-07-01 ENCOUNTER — Ambulatory Visit: Payer: Self-pay | Admitting: Sports Medicine

## 2011-07-01 VITALS — BP 130/82 | Ht 65.0 in | Wt 257.0 lb

## 2011-07-01 DIAGNOSIS — IMO0002 Reserved for concepts with insufficient information to code with codable children: Secondary | ICD-10-CM

## 2011-07-01 DIAGNOSIS — M1710 Unilateral primary osteoarthritis, unspecified knee: Secondary | ICD-10-CM

## 2011-07-04 LAB — HM MAMMOGRAPHY

## 2011-07-06 ENCOUNTER — Other Ambulatory Visit: Payer: Self-pay | Admitting: Orthopedic Surgery

## 2011-07-06 DIAGNOSIS — M1990 Unspecified osteoarthritis, unspecified site: Secondary | ICD-10-CM

## 2011-07-08 ENCOUNTER — Ambulatory Visit: Payer: Self-pay | Admitting: Primary Care

## 2011-07-08 ENCOUNTER — Encounter: Payer: Self-pay | Admitting: Primary Care

## 2011-07-08 ENCOUNTER — Encounter: Payer: Self-pay | Admitting: Gastroenterology

## 2011-07-08 VITALS — BP 132/85 | HR 72 | Ht 65.25 in | Wt 264.0 lb

## 2011-07-08 DIAGNOSIS — R42 Dizziness and giddiness: Secondary | ICD-10-CM

## 2011-07-08 DIAGNOSIS — G43909 Migraine, unspecified, not intractable, without status migrainosus: Secondary | ICD-10-CM

## 2011-07-08 DIAGNOSIS — R52 Pain, unspecified: Secondary | ICD-10-CM

## 2011-07-08 DIAGNOSIS — F32A Depression, unspecified: Secondary | ICD-10-CM

## 2011-07-08 MED ORDER — RIZATRIPTAN BENZOATE 10 MG PO TABS *A*
ORAL_TABLET | ORAL | Status: DC
Start: 2011-07-08 — End: 2012-07-19

## 2011-07-13 ENCOUNTER — Other Ambulatory Visit: Payer: Self-pay | Admitting: Primary Care

## 2011-07-13 MED ORDER — ESCITALOPRAM OXALATE 10 MG PO TABS *I*
10.0000 mg | ORAL_TABLET | Freq: Every day | ORAL | Status: DC
Start: 2011-07-13 — End: 2011-11-13

## 2011-07-21 ENCOUNTER — Other Ambulatory Visit: Payer: Self-pay

## 2011-07-21 MED ORDER — BUPROPION HCL 150 MG PO TB24 *I*
ORAL_TABLET | ORAL | Status: DC
Start: 2011-07-21 — End: 2011-10-19

## 2011-08-25 ENCOUNTER — Encounter: Payer: Self-pay | Admitting: Gastroenterology

## 2011-08-27 ENCOUNTER — Encounter: Payer: Self-pay | Admitting: Sports Medicine

## 2011-08-27 ENCOUNTER — Ambulatory Visit: Payer: Self-pay | Admitting: Sports Medicine

## 2011-08-27 VITALS — BP 143/66 | Ht 65.0 in | Wt 257.0 lb

## 2011-08-27 DIAGNOSIS — M1710 Unilateral primary osteoarthritis, unspecified knee: Secondary | ICD-10-CM

## 2011-08-27 NOTE — Progress Notes (Signed)
PATIENTLILLION, [name] MR #:  [id number]   ACCOUNT #:  [id number] DOB:  04/29/1956   DICTATED BY:  Frances Nickels, PA DATE OF VISIT:  08/27/2011     CHIEF COMPLAINT:  Followup for left knee.    INTERVAL HISTORY:  The patient underwent surgery on 06/19/11, arthroscopic partial medial and lateral meniscectomy and chondroplasty.  She has done reasonably well but in the last few weeks she has had an increase in symptoms with swelling and tenderness, difficulty descending stairs.  She continues to do a self-directed PT program.    EXAM:  Left knee today reveals tenderness both medially and laterally.  A small effusion is present.  No warmth or redness.  Well-healed arthroscopy portals.  Full extension, flexion to 120.    ASSESSMENT:  At this point, she would benefit from a corticosteroid injection and she is anxious to proceed.  Under sterile precautions, I have injected the left knee using 80 mg Depo-Medrol, 8 cc 1% Xylocaine.  She tolerated the procedure well, was given post-injection instructions, and will return to the office on a p.r.n. basis.       Dictated By:  Frances Nickels, PA      ______________________________  Verita Schneiders, MD    LJS/MODL  DD:  08/27/2011 13:59:46  DT:  08/27/2011 14:56:10  Job #:  [id number]    cc: Gwenette Greet, MD   [address]   [place], [place] [address]

## 2011-08-27 NOTE — Progress Notes (Signed)
Dictated

## 2011-10-19 ENCOUNTER — Other Ambulatory Visit: Payer: Self-pay | Admitting: Primary Care

## 2011-10-23 ENCOUNTER — Other Ambulatory Visit: Payer: Self-pay | Admitting: Primary Care

## 2011-11-12 ENCOUNTER — Encounter: Payer: Self-pay | Admitting: Primary Care

## 2011-11-13 ENCOUNTER — Other Ambulatory Visit: Payer: Self-pay | Admitting: Primary Care

## 2011-11-17 ENCOUNTER — Encounter: Payer: Self-pay | Admitting: Primary Care

## 2011-11-17 ENCOUNTER — Ambulatory Visit: Payer: Self-pay | Admitting: Primary Care

## 2011-11-17 VITALS — BP 132/85 | HR 84 | Resp 14 | Ht 65.0 in | Wt 263.0 lb

## 2011-11-17 DIAGNOSIS — M1710 Unilateral primary osteoarthritis, unspecified knee: Secondary | ICD-10-CM

## 2011-11-17 DIAGNOSIS — E669 Obesity, unspecified: Secondary | ICD-10-CM

## 2011-11-17 DIAGNOSIS — F32A Depression, unspecified: Secondary | ICD-10-CM

## 2011-11-17 MED ORDER — BUPROPION HCL 150 MG PO TB24 *I*
ORAL_TABLET | ORAL | Status: DC
Start: 2011-11-17 — End: 2012-03-12

## 2011-11-17 MED ORDER — ESCITALOPRAM OXALATE 10 MG PO TABS *I*
ORAL_TABLET | ORAL | Status: DC
Start: 2011-11-17 — End: 2012-03-22

## 2011-11-17 NOTE — Progress Notes (Signed)
CC--f/u      HPI      Knee pain--advil helps more for the knee and with just taking a few a day with less dizziness, still limping at times, last steroid steroid shot was in feb    Mood--doing well , no longer seeing dr green, mood is 8-9 /10, only issue is the hormonal swings--offered to go to 5mg  lexapro--she will try this and cut her 10 mg tablets in half.    Overweight-she watches her diet pretty well but has not been very good with exercise at all.  She clearly has difficulty with lots of exercise secondary to the knee pain cycling does not bother her she does have a bicycle at home I encouraged her to start cycling every morning for 30 minutes 5 days a week and build up to an hour a day this should increase her metabolic rate and help her lose weight she should also avoid carbohydrates.    Review of systems    Feeling fine  No fever  No headache  No chest pain  No shortness of breath  No change of appetite  No nausea  No vomiting  No abdominal pain  No stool changes  No urinary symptoms          Current Outpatient Prescriptions   Medication Sig Dispense Refill   . escitalopram (LEXAPRO) 10 MG tablet TAKE ONE TABLET BY MOUTH ONCE DAILY  30 tablet  1   . buPROPion (WELLBUTRIN XL) 150 MG 24 hr tablet TAKE ONE TABLET BY MOUTH EVERY MORNING . SWALLOW WHOLE. DO NOT CRUSH, BREAK OR CHEW  30 tablet  0   . rizatriptan (MAXALT) 10 MG tablet TAKE 1 TABLET AT ONSET OF HEADACHE. MAY REPEAT EVERY 2 HOURS AS NEEDED. MAXIMUM 3 TABLETS IN 24 HOURS.  10 tablet  5   . Calcium 500 MG tablet TAKE 1 TABLET TWICE DAILY    0   . Cholecalciferol (VITAMIN D3) 1000 UNIT tablet TAKE 1 TABLET DAILY.    0   . Ibuprofen (ADVIL PO) Take  by mouth.       . Rizatriptan Benzoate (MAXALT PO) Take  by mouth.         No current facility-administered medications for this visit.         Filed Vitals:    11/17/11 0725   BP: 132/85   Pulse: 84   Resp: 14   Height: 1.651 m (5\' 5" )   Weight: 119.296 kg (263 lb)         EXAM  Gen: patient appears  comfortable and in no distress  Cv: RRR, Nl S1,S2  Resp: CTA b/l, no rales, no rhonchi  Ext: no edema          A/P pleasant female seen here for followup of several issues.    Mood doing well she will continue buproprion as is and cut the Lexapro to 5 mg daily.    Overweight encouraged her to exercise daily getting up to 60 minutes every day and avoiding excess carbohydrates.  This should help her knee pain as well    Knee degenerative arthritis recommend weight loss she may take Advil or Aleve as long as it doesn't cause a lot of dizziness for her and she may take supplemental Tylenol when needed as well.

## 2011-12-17 ENCOUNTER — Encounter: Payer: Self-pay | Admitting: Primary Care

## 2011-12-25 ENCOUNTER — Encounter: Payer: Self-pay | Admitting: Primary Care

## 2012-01-11 ENCOUNTER — Other Ambulatory Visit: Payer: Self-pay | Admitting: Primary Care

## 2012-01-12 ENCOUNTER — Other Ambulatory Visit: Payer: Self-pay | Admitting: Primary Care

## 2012-01-14 ENCOUNTER — Encounter: Payer: Self-pay | Admitting: Gastroenterology

## 2012-01-14 DIAGNOSIS — M7750 Other enthesopathy of unspecified foot: Secondary | ICD-10-CM

## 2012-01-14 HISTORY — DX: Other enthesopathy of unspecified foot and ankle: M77.50

## 2012-01-25 ENCOUNTER — Encounter: Payer: Self-pay | Admitting: Primary Care

## 2012-03-12 ENCOUNTER — Other Ambulatory Visit: Payer: Self-pay | Admitting: Primary Care

## 2012-03-22 ENCOUNTER — Ambulatory Visit: Payer: Self-pay | Admitting: Primary Care

## 2012-03-22 VITALS — BP 118/88 | HR 80 | Resp 14 | Ht 64.96 in | Wt 257.0 lb

## 2012-03-22 DIAGNOSIS — G43909 Migraine, unspecified, not intractable, without status migrainosus: Secondary | ICD-10-CM

## 2012-03-22 NOTE — Progress Notes (Signed)
CCf/u      HPI  Feet--better with orthotics and helped right knee as well    Left knee--going for synvisc next week    Mood--8-9/10,on just 5 lexapro and wellbutrin--happy and content, rec'd continue welbutrin, some dry burning to eyes--may be from new work environment, not suicidal feels well    Wt--down from last visit--is riding bike about 4 days a week,  Rhinitis primarily when eating no trouble swallowing no heartburn clear discharge  Migraine headaches controlled with Maxalt and/or ibuprofen for couple days occurs about every 4 months.  Review of systems    Feeling fine  No fever   headache migraine once every 4 months controlled --  No chest pain  No shortness of breath  No cough  No change of appetite  No nausea  No vomiting  No abdominal pain  No stool changes  No urinary symptoms  No dizziness  No lightheadedness  No skin rashes        Current Outpatient Prescriptions   Medication Sig Dispense Refill   . escitalopram (LEXAPRO) 10 MG tablet Take 5 mg by mouth daily       . buPROPion (WELLBUTRIN XL) 150 MG 24 hr tablet TAKE ONE TABLET BY MOUTH EVERY MORNING; SWALLOW WHOLE. DO NOT CRUSH, BREAK OR CHEW.  30 tablet  3   . rizatriptan (MAXALT) 10 MG tablet TAKE 1 TABLET AT ONSET OF HEADACHE. MAY REPEAT EVERY 2 HOURS AS NEEDED. MAXIMUM 3 TABLETS IN 24 HOURS.  10 tablet  5   . Calcium 500 MG tablet TAKE 1 TABLET TWICE DAILY    0   . Cholecalciferol (VITAMIN D3) 1000 UNIT tablet TAKE 1 TABLET DAILY.    0   . Ibuprofen (ADVIL PO) Take  by mouth.         No current facility-administered medications for this visit.         Filed Vitals:    03/22/12 0730   BP: 118/88   Pulse: 80   Resp: 14   Height: 1.65 m (5' 4.96")   Weight: 116.574 kg (257 lb)         EXAM  Gen: patient appears comfortable and in no distress  Cv: RRR, Nl S1,S2  Resp: CTA b/l, no rales, no rhonchi  Ext: no edema  HEENT nasal mucosa grossly normal        A/PPleasant female seen here for followup of several issues.    Mood doing well on just low-dose  Wellbutrin and 5 mg generic Lexapro we will stop the Lexapro and continue the Wellbutrin she'll followup in 3-4 months.  Next    Flu shot given.    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    Migraine headaches controlled with a couple days of ibuprofen and/or the Maxalt.      Overweight encouraged her to continue to work on weight loss with regular exercise.

## 2012-03-29 ENCOUNTER — Ambulatory Visit: Payer: Self-pay | Admitting: Orthopedic Surgery

## 2012-03-29 ENCOUNTER — Encounter: Payer: Self-pay | Admitting: Orthopedic Surgery

## 2012-03-29 VITALS — BP 132/68 | Ht 65.0 in | Wt 257.0 lb

## 2012-03-29 DIAGNOSIS — M1710 Unilateral primary osteoarthritis, unspecified knee: Secondary | ICD-10-CM

## 2012-03-29 NOTE — Progress Notes (Signed)
Dictated

## 2012-03-29 NOTE — Progress Notes (Signed)
PATIENTTASHIA, [name] MR #:  [id number]   ACCOUNT #:  [id number] DOB:  [birth year]   DICTATED BY:  Frances Nickels, PA DATE OF VISIT:  03/29/2012     CHIEF COMPLAINT:  Left knee pain.    INTERVAL HISTORY:  The patient was last seen on August 27, 2011, and had a corticosteroid injection, which helped a little.  She has talked with her primary care physician, however, and he has discussed with her the possibility of viscosupplementation, and she is here today to discuss that.  We discussed the pros and cons, and she is willing to proceed.    PHYSICAL EXAMINATION:  Exam of her left knee today reveals no effusion.  Tenderness primarily medially.  No warmth or redness.  No breaks in the skin.  Range of motion 0-120.    PLAN:  Under sterile precautions, I have injected the left knee using the anterolateral approach to the joint with Orthovisc 2 cc/30 mg.  She tolerated the procedure well, was given post-injection instructions and will return to the office in 1 week for the second in the series.       Dictated By:  Frances Nickels, PA      ______________________________  Verita Schneiders, MD    LJS/MODL  DD:  03/29/2012 14:55:42  DT:  03/29/2012 20:06:23  Job #:  [id number]    cc: Gwenette Greet, MD   [address]   [place], [place] [address]

## 2012-04-08 ENCOUNTER — Encounter: Payer: Self-pay | Admitting: Orthopedic Surgery

## 2012-04-08 ENCOUNTER — Ambulatory Visit: Payer: Self-pay | Admitting: Orthopedic Surgery

## 2012-04-08 VITALS — BP 108/54 | HR 65 | Ht 65.0 in | Wt 255.0 lb

## 2012-04-08 DIAGNOSIS — M1710 Unilateral primary osteoarthritis, unspecified knee: Secondary | ICD-10-CM

## 2012-04-08 NOTE — Progress Notes (Signed)
Dictated

## 2012-04-08 NOTE — Progress Notes (Signed)
PATIENTGLENNIE, ELLEY MR #:  [id number]   ACCOUNT #:  [id number] DOB:  1956-04-19   DICTATED BY:  Frances Nickels, PA DATE OF VISIT:  04/08/2012     CHIEF COMPLAINT:  Followup for left knee.    INTERVAL HISTORY:  The patient returns to the office today regarding her left knee.  She says that she did get some initial relief following the initial injection of Orthovisc last week.  As a result, she probably over did it the 2nd day and did result in some swelling and increased pain.    PHYSICAL EXAMINATION:  Exam today reveals no warmth, redness or breaks in the skin.  Range of motion is 0-120.    PLAN:  Under sterile precautions, I injected the left knee using 2 cc Orthovisc/30 mg.  She tolerated procedure well, was given post-injection instructions and will return to the office in 1 week for completion of the series.       Dictated By:  Frances Nickels, PA      ______________________________  Verita Schneiders, MD    LJS/MODL  DD:  04/08/2012 13:51:12  DT:  04/08/2012 15:17:55  Job #:  [id number]    cc: Gwenette Greet, MD   [address]   [place], [place] [address]

## 2012-04-15 ENCOUNTER — Ambulatory Visit: Payer: Self-pay | Admitting: Orthopedic Surgery

## 2012-04-15 ENCOUNTER — Encounter: Payer: Self-pay | Admitting: Orthopedic Surgery

## 2012-04-15 VITALS — BP 139/63 | Ht 65.0 in | Wt 255.7 lb

## 2012-04-15 DIAGNOSIS — M1710 Unilateral primary osteoarthritis, unspecified knee: Secondary | ICD-10-CM

## 2012-04-15 NOTE — Patient Instructions (Signed)
Injection Instructions    · The injection you received today may take 5-7 days to work.  · You may find the area that was injected to be more painful for 1 or 2 days after the injection.  This happens as the medicine is being absorbed in your body.  · It may be helpful to put ice on the body part that was injected to ease the pain.  · You may also use pain medication - Tylenol, Advil/Motrin or Aleve.  · Remember, it may take 5-7 days for you to feel the full results of the injection.  · If you are diabetic the medicine in the injection can increase your blood sugar level for several days.  Monitor your glucose levels closely.

## 2012-04-15 NOTE — Progress Notes (Signed)
Dictated

## 2012-04-15 NOTE — Progress Notes (Signed)
PATIENTRANYIAH, [name] MR #:  [id number]   ACCOUNT #:  [id number] DOB:  12-17-1955   DICTATED BY:  Frances Nickels, PA DATE OF VISIT:  04/15/2012     CHIEF COMPLAINT:  Followup for left knee.    INTERVAL HISTORY:  The patient has now received 2 Orthovisc injections and she really has not seen a significant increase in comfort.    PHYSICAL EXAMINATION:  Exam today reveals no warmth, redness, breaks in the skin and very small effusion.    PLAN:  Under sterile precautions, I injected the left knee with 2 cc/30 mg Orthovisc using the anterolateral approach.  She tolerated the procedure well, was given post-injection instructions, will return to the office on a p.r.n. basis.       Dictated By:  Frances Nickels, PA      ______________________________  Verita Schneiders, MD    LJS/MODL  DD:  04/15/2012 13:52:55  DT:  04/15/2012 20:23:02  Job #:  [id number]    cc: Gwenette Greet, MD   [address]   [place], [place] [address]

## 2012-06-09 ENCOUNTER — Ambulatory Visit: Payer: Self-pay | Admitting: Primary Care

## 2012-06-09 ENCOUNTER — Encounter: Payer: Self-pay | Admitting: Primary Care

## 2012-06-09 VITALS — BP 132/82 | HR 64 | Resp 14 | Ht 64.96 in | Wt 253.0 lb

## 2012-06-09 DIAGNOSIS — G43909 Migraine, unspecified, not intractable, without status migrainosus: Secondary | ICD-10-CM

## 2012-06-09 DIAGNOSIS — R1011 Right upper quadrant pain: Secondary | ICD-10-CM

## 2012-06-09 DIAGNOSIS — F32A Depression, unspecified: Secondary | ICD-10-CM

## 2012-06-09 NOTE — Progress Notes (Signed)
CCf/u and stomach pain      HPI   Stomach pain began mid October, occuring after eating --right upper abdomen the occurred a second time in mid-November each time occurring after eating fatty meals.  never had any radiation to the pain never had nausea vomiting but did have decreased appetite ever took any pain medications for it, we reviewed CT scan from a couple years ago, presently no pain did fine with Thanksgiving despite eating fatty meals CT scan from a couple years ago revealed gallstones.  Chest family history of members needing gallbladder out.    Mood doing well 7-8/10 not suicidal happy content.  Takes her medication regularly    Migraine headaches not needing the Maxalt occasionally will take ibuprofen      Review of systems    Feeling fine  No fever  No headache  No chest pain  No shortness of breath  No change of appetite  No nausea  No vomiting  No abdominal pain  No stool changes  No urinary symptoms          Current Outpatient Prescriptions   Medication Sig Dispense Refill   . buPROPion (WELLBUTRIN XL) 150 MG 24 hr tablet TAKE ONE TABLET BY MOUTH EVERY MORNING; SWALLOW WHOLE. DO NOT CRUSH, BREAK OR CHEW.  30 tablet  3   . rizatriptan (MAXALT) 10 MG tablet TAKE 1 TABLET AT ONSET OF HEADACHE. MAY REPEAT EVERY 2 HOURS AS NEEDED. MAXIMUM 3 TABLETS IN 24 HOURS.  10 tablet  5   . Calcium 500 MG tablet TAKE 1 TABLET TWICE DAILY    0   . Cholecalciferol (VITAMIN D3) 1000 UNIT tablet TAKE 1 TABLET DAILY.    0   . Ibuprofen (ADVIL PO) Take  by mouth.         No current facility-administered medications for this visit.         There were no vitals filed for this visit.      EXAM  Gen: patient appears comfortable and in no distress  Cv: RRR, Nl S1,S2  Resp: CTA b/l, no rales, no rhonchi  Ext: no edema  Abdomen:  Soft nontender nondistended no masses to palpation positive bowel sounds        A/P  Assessment and plan pleasant female with new onset episodes of right upper quadrant pain after meals we will check a  right upper quadrant ultrasound.  Likely she'll need her gallbladder out this is my suspicion.  Pending the ultrasound we will refer her to surgery she is not interested at this time.  She will avoid fatty meals.    Migraine headaches doing well takes when necessary ibuprofen and Maxalt.  Mood doing well continue antidepressant followup in 3-4 months.

## 2012-06-16 ENCOUNTER — Telehealth: Payer: Self-pay | Admitting: Primary Care

## 2012-06-16 DIAGNOSIS — R109 Unspecified abdominal pain: Secondary | ICD-10-CM

## 2012-06-16 NOTE — Telephone Encounter (Signed)
Message copied by Martha Clan on Thu Jun 16, 2012  3:58 PM  ------       Message from: Gwenette Greet       Created: Thu Jun 16, 2012  9:31 AM         Please let her know the ultrasound showed several gallstones but no evidence of acute cholecystitis.  I would recommend we'll be scheduling to see gastroenterology.  Please let me know if we can set up and whom you would like to see.  ------

## 2012-06-16 NOTE — Telephone Encounter (Signed)
Left a message on machine for patient to return call to the office

## 2012-06-17 NOTE — Addendum Note (Signed)
Addended by: Damien Fusi on: 06/17/2012 03:19 PM     Modules accepted: Orders

## 2012-06-17 NOTE — Telephone Encounter (Signed)
She would like [place] to set her up with a gi and call her with an appt

## 2012-06-17 NOTE — Addendum Note (Signed)
Addended by: Gwenette Greet on: 06/17/2012 04:16 PM     Modules accepted: Orders

## 2012-06-20 NOTE — Telephone Encounter (Signed)
i signed referral to gen surgery consult

## 2012-06-20 NOTE — Addendum Note (Signed)
Addended by: Damien Fusi on: 06/20/2012 10:50 AM     Modules accepted: Orders

## 2012-06-20 NOTE — Addendum Note (Signed)
Addended by: Gwenette Greet on: 06/20/2012 03:54 PM     Modules accepted: Orders

## 2012-06-21 ENCOUNTER — Ambulatory Visit: Payer: Self-pay | Admitting: Orthopedic Surgery

## 2012-06-21 ENCOUNTER — Encounter: Payer: Self-pay | Admitting: Orthopedic Surgery

## 2012-06-21 VITALS — BP 136/67 | Ht 65.0 in | Wt 255.0 lb

## 2012-06-21 DIAGNOSIS — M1710 Unilateral primary osteoarthritis, unspecified knee: Secondary | ICD-10-CM

## 2012-06-21 NOTE — Progress Notes (Signed)
[name], [name] MR #:  [id number]   ACCOUNT #:  [id number] DOB:  06/07/1956   DICTATED BY:  Victoria Nickels, PA DATE OF VISIT:  06/21/2012     CHIEF COMPLAINT:  Followup for left knee.    INTERVAL HISTORY:  The patient was last seen on 04/15/2012, for completion of the viscosupplementation series with Orthovisc.  She said she actually got some relief after the 2nd 1, but after the 3rd 1 she did not feel better and symptoms have increased since then.  She returns today for an injection of cortisone.    PHYSICAL EXAMINATION:  Exam of the left knee today reveals small effusion, no warmth, redness or breaks in the skin.  Range of motion 0/120.    PLAN:  Under sterile precautions, I injected the left knee using 80 mg Depo-Medrol, 6 cc 1% Xylocaine using the anterolateral approach to the joint.  She tolerated the procedure well, was given post-injection instructions and will return to the office on a p.r.n. basis.       Dictated By:  Victoria Nickels, PA      ______________________________  Verita Schneiders, MD    LJS/MODL  DD:  06/21/2012 12:41:16  DT:  06/21/2012 13:51:45  Job #:  [id number]    cc: Verita Schneiders, MD   Gwenette Greet, MD   [address]   [place], [place] [address]

## 2012-06-21 NOTE — Progress Notes (Signed)
Dictated

## 2012-06-28 NOTE — Telephone Encounter (Signed)
error 

## 2012-07-05 ENCOUNTER — Other Ambulatory Visit: Payer: Self-pay | Admitting: Primary Care

## 2012-07-09 ENCOUNTER — Encounter: Payer: Self-pay | Admitting: Gastroenterology

## 2012-07-12 ENCOUNTER — Encounter: Payer: Self-pay | Admitting: Gastroenterology

## 2012-07-12 ENCOUNTER — Encounter: Payer: Self-pay | Admitting: Primary Care

## 2012-07-12 LAB — HM MAMMOGRAPHY

## 2012-07-14 ENCOUNTER — Encounter: Payer: Self-pay | Admitting: Primary Care

## 2012-07-14 ENCOUNTER — Telehealth: Payer: Self-pay | Admitting: Primary Care

## 2012-07-14 DIAGNOSIS — N63 Unspecified lump in unspecified breast: Secondary | ICD-10-CM

## 2012-07-14 NOTE — Telephone Encounter (Signed)
Sent her a message through MyChart:   January 22 at 8:15 at [facility], [facility] [address]  Ivan Anchors to bring films/disc with her.

## 2012-07-14 NOTE — Telephone Encounter (Signed)
Please set up with [name] for [facility] opinion regarding left breast nodule

## 2012-07-18 ENCOUNTER — Encounter (INDEPENDENT_AMBULATORY_CARE_PROVIDER_SITE_OTHER): Payer: Self-pay

## 2012-07-19 ENCOUNTER — Encounter: Payer: Self-pay | Admitting: Surgical Oncology

## 2012-07-19 ENCOUNTER — Ambulatory Visit: Payer: Self-pay | Admitting: Surgical Oncology

## 2012-07-19 VITALS — BP 129/82 | HR 76 | Temp 97.6°F | Resp 14 | Ht 65.0 in | Wt 250.0 lb

## 2012-07-19 DIAGNOSIS — E669 Obesity, unspecified: Secondary | ICD-10-CM

## 2012-07-19 DIAGNOSIS — R109 Unspecified abdominal pain: Secondary | ICD-10-CM | POA: Insufficient documentation

## 2012-07-19 DIAGNOSIS — L719 Rosacea, unspecified: Secondary | ICD-10-CM

## 2012-07-19 DIAGNOSIS — I059 Rheumatic mitral valve disease, unspecified: Secondary | ICD-10-CM

## 2012-07-19 DIAGNOSIS — G4730 Sleep apnea, unspecified: Secondary | ICD-10-CM

## 2012-07-19 DIAGNOSIS — E559 Vitamin D deficiency, unspecified: Secondary | ICD-10-CM

## 2012-07-19 DIAGNOSIS — K8020 Calculus of gallbladder without cholecystitis without obstruction: Secondary | ICD-10-CM

## 2012-07-19 DIAGNOSIS — G43909 Migraine, unspecified, not intractable, without status migrainosus: Secondary | ICD-10-CM

## 2012-07-19 HISTORY — DX: Unspecified abdominal pain: R10.9

## 2012-07-19 NOTE — Patient Instructions (Addendum)
Low-Fat Diet (50 Grams)         What Is a Fat-Restricted Diet?     A fat-restricted diet limits the amount of fat you can eat each day.  Why Should I Follow a Fat-Restricted Diet?     This diet may be prescribed for people with medical conditions that make it difficult to digest fat. Examples include chronic pancreatitis and gallbladder disease. A fat-restricted diet will minimize the unpleasant side effects of fat malabsorption, such as diarrhea , gas, and cramping.   Fat-Restricted Diet Basics     A fat-restricted diet typically limits fat intake to 50 grams per day. Fat contains nine calories per gram. So, if you need 2,000 calories per day, this means only about 22% of those calories can be from fat. The rest should be from carbohydrates and proteins.  For most people, it is possible to meet all nutrient requirements on this diet. However, a supplement may be recommended if fat is very limited or you are on the diet for a long time. Vitamins A , D , E , and K need fat to be absorbed. Your doctor or a dietitian may recommend supplements for these vitamins.     Eating Guide for a Fat-Restricted Diet  :  The following guide is broken down into categories based on the Choose My Plate website recommendations for healthy eating. It is recommended that you work with a dietitian to determine how many servings of each category you should eat. Here are some general recommendations:     The base of your diet should be composed of grains, vegetables, and fruit. Strive to eat foods from these three categories at each meal. Fruits and vegetables should cover half of your plate at each meal. When eating grains, choose foods made with whole grains instead of refined grains.     Limit your intake of meat, fish, poultry, and eggs to 6 ounces per day.     Consume no more than 3 teaspoons of fat per day.     Enjoy low-fat or fat-free sweets or snack foods in moderation.     If you enjoy healthy fats (eg, nuts, olives, and  avocados), ask your doctor or dietitian about how you can add these foods into your diet. Since these foods have a lot of fat, they need to be added to your day's intake of fat.        Food Category Foods Recommended Foods to Avoid   Grains   Whole grain breads     Low-fat whole grain cereals     Rice     Pasta or noodles     Homemade pancakes or [place] toast made with minimal fat     Low-fat crackers     Baked chips     Pretzels     Unbuttered popcorn   Fried rice     Granola     Biscuits     Sweet rolls     Muffins, scones, coffee bread, doughnuts     Most pancakes and waffles     Cheese bread   Vegetables   Fresh, frozen, or canned vegetables   Vegetables prepared with butter, oil, or sauce     Fried vegetables     Mashed potatoes made with butter, margarine, or cream     [place] fries   Fruit   Fresh, frozen, canned, or dried fruits   Avocados, coconuts, and olives     Fruit prepared with butter, cream,  or sauce   Milk   Fat-free like nonfat, skim milk     Low-fat or nonfat cheeses     Fat-free yogurt or kefir     Fat-free buttermilk   Reduced fat (2%) or whole milk     Chocolate milk     Cream like whipped, heavy, or sour     Whole milk yogurt     Regular cheese   Proteins   Lean meats     Chicken or [place] without the skin     Lean fish     Beans and legumes     Egg whites; limit whole eggs to 3 per week   Fatty cuts of meat     Duck or goose     Dynegy or hot dogs     Cold cuts     Fish canned in oil     Nuts and peanut butter           Fats and Sweets in moderation    Honey     Jam     Hard candies     Jelly beans     Marshmallows     Low-fat or fat-free ice cream or frozen yogurt     Sherbets or fruit ice     Jell-O     [employer]   Butter, margarine, lard, shortening in excess of allowed amount     Snack chips     Ice cream     Pastries, pie, cake, and cookies     Chocolate     Most candy   Beverages   Coffee, tea     Carbonated beverages     Juice     Water     Coffee drinks made with  fat-free milk     Cocoa made with fat-free milk   Frappes, milk shakes     Eggnog   Other   Soups made from a fat-free milk or broth base     Herbs and spices     Salt in moderation   Cream soups     Non-dairy creamer     Suggestions on Eating a Fat-Restricted Diet       Look for the following key phrases on food labels: low-fat, nonfat, and fat-free.     Choose foods that contain less than 3 grams of fat per serving. Be sure to eat only one serving.     Avoid fried and sauted foods. Use low-fat cooking methods, such as baking, roasting, broiling, poaching, grilling, boiling, or steaming.     Select lean cuts of meat, such as loin and round. Trim visible fat before cooking.     Eat small frequent meals, rather than two or three large meals. This will make it easier for your body to digest any fat that you consume.     Work with a Museum/gallery exhibitions officer to come up with an individualized diet plan.Call us fo       Laparoscopic/Gallbladder Removal    Gallbladder removal is one of the most commonly performed surgical procedures in the [place].  Today, gallbladder surgery is performed laparoscopically or robotically.  The medical name for this is Cholecystectomy.    WHAT IS THE GALLBLADDER?    The gallbladder is a pear-shaped organ that rests beneath the right side of the liver  Its main purpose is to collect and concentrate a digestive liquid (bile) produced by the liver.  Bile is  released from the gallbladder after eating, aiding digestion.  Bile travels through narrow tubular channels (bile ducts) into the small intestine.  Removal of the gallbladder is not associated with any impairment of digestion in most people.    WHAT CAUSES GALLBLADDER PROBLEMS?    Gallbladder problems are usually caused by the presence of gallstones:small hard masses consisting primarily of cholesterol and bile salts that form in the gallbladder or in the bile duct.  There is no known means to prevent gallstones.  These stones may block  the flow of bile out of the gallbladder, causing it to well and resulting in sharp abdominal pain, vomiting, indigestion and occasionally fever  If the gallstone blocks the common bile duct, jaundice (a yellowing of the skin) can occur    HOW ARE THESE PROBLEMS FOUND AND TREATED?    Ultrasound is most commonly used to find gallstones.    In a few more complex cases, other X-ray tests may be used to evaluate gallbladder disease.  Gallstones do not go away on their own.   Some can be temporarily managed with drugs or by making dietary adjustments, such as reducing fat intake.  This treatment has a low, short term success rate.  Symptoms eventually continue unless the gallbladder is removed  Surgical removal of the gallbladder is the time honored and safest treatment of gallbladder disease.    WHAT ARE THE ADVANTAGES OF PERFORMING THE PROCEDURE LAPAROSCOPICALLY?    Rather than a five to seven inch incision, the operation requires only four small openings in the abdomen.   Patients usually have minimal postoperative pain.  Patients usually experience faster recovery than open gallbladder surgery patients.   Most patients go home within one day and enjoy a quicker return to normal activities.     ARE YOU A CANDIDATE FOR LAPAROSCOPIC GALLBLADDER REMOVAL?    Although there are many advantages to laparoscopy, the procedure may not be appropriate for some patients who have had previous upper abdominal surgery or who have some pre-existing medical conditions.  Under rare circumstances, the procedure may need to be converted to an open or traditional surgical procedure.     WHAT PREPARATION IS REQUIRED?    The following includes typical events that may occur prior to laparoscopic surgery; however, since each patient and surgeon is unique, what will actually occur may be different:     Preoperative preparation includes blood work, medical evaluation, chest xray and an EKG depending on your age and medical condition.  After we  review with you the potential risks and benefits of the operation, you will need to provide written consent for surgery.  We may request that you completely empty your colon and cleanse your intestines prior to surgery.  Though this is not usually needed.   It is recommended that you shower the night before or morning of the operation.  After midnight the night before the operation, you should not eat or drink anything except medications that we tell you are permissible to take with a sip of water the morning of surgery.   Drugs such as aspirin, blood thinners, anti-inflammatory medications (arthritis medications) and Vitamin E will need to be stopped temporarily for several days to a week prior to surgery.   Diet medication or St. John's Wort should not be used foe the two weeks prior to surgery.   Quit smoking and arrange for any help you may need at home.    HOW IS LAPAROSCOPIC GALLBLADDER REMOVAL PERFORMED?  Under general anesthesia, so the patient is asleep throughout the procedure  Using a cannula (a narrow tube-like instrument), we enter the abdomen in the area of the belly-button.  A laparoscope (a tiny telescope) connected to a special camera is inserted through the cannula, giving [place] a magnified view of the patient's internal organs on a television screen.   Other cannulas are inserted which allow [place] to delicately separate the gallbladder from its attachments and then remove it through one of the openings.   Sometimes we perform an x-ray, called a cholangiogram, to identify stones, which may be located in the bile channels, or to insure that structures have been identified.   If we find one or more stones in the common bile duct, we may remove them with a special scope, may choose to have them removed later through a second minimally invasive procedure, or may convert to an open operation in order to remove all the stones during the operation.  After we remove the gallbladder, the small incisions are  closed with a stitch or two or with surgical tape.    WHAT HAPPENS IF THE OPERATION CANNOT BE PERFORMED OR COMPLETED BY THE LAPAROSCOPIC METHOD?    In a small number of patients the laparoscopic method cannot be performed.  Factors that may increase the possibility of choosing or converting to the "open" procedure may include obesity, a history of prior abdominal surgery causing dense scar tissue, inability to visualize organs or bleeding problems during the operation.     The decision to perform the open procedure is a  Judgement decision made by Dr. Emeline General either before or during the actual operation.  When a surgeon feels that it is safest to convert the laparoscopic procedure to an open one, this is not a complication, but rather sound surgical judgement.  The decision to convert to an open procedure is strictly based on patient safety.     WHAT SHOULD I EXPECT AFTER GALLBLADDER SURGERY?    Gallbladder removal is a major abdominal operation and a certain amount of postoperative pain occurs.  Nausea and vomiting are not uncommon.    Once liquids or a diet is tolerated, patients leave the hospital the same day or day following the laparoscopic gallbladder surgery.   Activity is dependent on how the patient feels.  Walking is encouraged.  Patients can remove the dressings and shower the day after the operation.   Patients will probably be able to return to normal activities within a week's time, including driving, walking up stairs, light lifting and working.   In general, recovery should be progressive, once the patient is at home.   The onset of fever, yellow skin or eyes, worsening abdominal pain, distention, persistent nausea or vomiting, or drainage from the incision are indications that a complication may have occurred.  We should be contacted in these instances.   Most patients who have a laparoscopic gallbladder removal go home from the hospital the day of surgery.  Some may go home the day after the  operation is performed.   Most patients can return to work within seven days following the laparoscopic procedure depending on the nature of your job.  Patients with administrative or desk jobs usually return in a few days while those involved in manual labor or heavy lifting may require a bit more time.  Patients undergoing the open procedure usually resume normal activities in four to six weeks.   Make an appointment with my office to be seen  within 2 weeks following your operation.       WHAT COMPLICATIONS CAN OCCUR?    While there are risks associated with any kind of operation, the vast majority of laparoscopic gallbladder patients experience few or no complications and quickly return to normal activities.     Complications of laparoscopic cholecystectomy are infrequent, but include bleeding, infection, pneumonia, blood clots or heart problems.  Unintended injury to adjacent structures such as the common bile duct or small bowel may occur and may require another surgical procedure to repair it.   Bile leakage intol the abdomen from the tubular channels leading from the liver to the intestine may rarely occur.    Numerous medical studies show that the complication rate for laparoscopic gallbladder surgery is comparable to the complication rate for open gallbladder surgery when performed by a properly trained surgeon.    WHEN TO CALL YOUR DOCTOR    Be sure to call your physician or surgeon if you develop any of the following:    Persistent fever over 1014 degrees AF (39 C)  Bleeding  Increasing abdominal swelling  Pain that is not relieved by your medications  Persistent nausea or vomiting  Chills  Persistent cough or shortness of breath  Purulent drainage (pus) from any incision  Redness surrounding any of your incisions that is worsening or getting bigger  You are unable to eat or drink liquids        This brochure is intended to provide a general overview of a laparoscopic gallbladder surgery.  It is not  intended to serve as a substitute for professional medical care or a discussion between you and Dr. Mila Homer about the need for laparoscopic gallbladder removal.  Specific recommendations may vary among health care professionals have a question about your need for a laparoscopic cholecystectomy, your alternatives, billing or insurance coverage, do not hesitate to ask [place] or our office staff about it.  If you have questions about the operation or subsequent follow up, discuss them with [place] before or after the operation       r more information: [phone]

## 2012-07-19 NOTE — H&P (Signed)
History and Physical  Chief Complaint   Patient presents with   . Initial Evaluation     Abdominal pain   . New Patient Visit     History of Present Illness: Terea was kindly referred by her primary care physician for surgical consultation regarding her episodes of abdominal pain and known gallstones.    Historically, she presented to the emergency room 2 years ago with right flank and right upper quadrant pain.  The differential diagnosis at that time included a kidney stone or gallstones.    Since then, the patient has had ongoing occasional "twinges" of right upper quadrant pain typically less than 60 minutes after eating a meal.  His symptoms resolved spontaneously.  They were never associated with fever nausea or emesis.  Several weeks ago she had a much more severe and acute episode of right upper quadrant abdominal pain after eating a large generally consisted of a pizza, wings and a peanut butter pie.  Pain is classic biliary pain.  That originated in the right upper quadrant and radiated to her back.  It lasted throughout the night.  She was evaluated by her primary care physician who obtained an updated ultrasound and LFTs.  Presents today for surgical consultation.    Patient has a very strong family history.  Her mother underwent emergency gallbladder surgery.  She has affected grandparents.  Her sister also has gallstones but is currently asymptomatic.  She'll is controlling her symptoms with a low-fat diet.  Problems:  Patient Active Problem List   Diagnosis Code   . Overweight 278.00   . Migraine Headache 346.90   . Rosacea 695.3   . 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 Sleep Apnea 780.57   . Vitamin D Deficiency 268.9   . 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 Depression 311   . Tear of medial cartilage or meniscus of knee, current 836.0   . 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 Abdominal pain 789.00   . Gallstones 574.20     Past Medical/Surgical History:   Past Medical History   Diagnosis Date   .  Migraine      q2-3 months   . Syncope      d/t migraines   . Urinary tract infection      frequent  3 this year   . Syncope and collapse    . Internal hemorrhoids    . Benign neoplasm of colon    . Ruptured cerebral aneurysm      age 51   . Complication of anesthesia      PONV   . Vertigo    . Calcaneal bursitis 01/14/2012     right   . Sleep apnea      does not use CPAP   . Mitral valve prolapse    . OA (osteoarthritis)    . Depression      Past Surgical History   Procedure Laterality Date   . Strabismus correction Right      x2--child + 4 yo   . Cesarean section, classic  '86 '88     spinal   . Colonoscopy  2012   . Wisdom tooth extraction  1978     4 wisdom and B/L bicuspids   . Knee arthroscopy Left 06/19/11     Allergies:    Allergies   Allergen Reactions   . Codeine Nausea And Vomiting     Current medications:    Current Outpatient Prescriptions on File  Prior to Visit   Medication Sig Dispense Refill   . rizatriptan (MAXALT) 10 MG tablet Take 10 mg by mouth as needed   TAKE 1 TABLET AT ONSET OF HEADACHE. MAY REPEAT EVERY 2 HOURS AS NEEDED. MAXIMUM 3 TABLETS IN 24 HOURS.       Marland Kitchen buPROPion (WELLBUTRIN XL) 150 MG 24 hr tablet TAKE ONE TABLET BY MOUTH EVERY MORNING SWALLOW WHOLE, DO NOT BREAK,CRUSH OR CHEW  30 tablet  2   . Ibuprofen (ADVIL PO) Take 600 mg by mouth every 6 hours as needed            No current facility-administered medications on file prior to visit.     Family History:     Family History   Problem Relation Age of Onset   . Breast cancer Mother    . Stroke Father    . Heart failure Father      Social/Occupational History:   History     Social History   . Marital Status: Married     Spouse Name: N/A     Number of Children: N/A   . Years of Education: N/A     Occupational History   . Not on file.     Social History Main Topics   . Smoking status: Never Smoker    . Smokeless tobacco: Never Used   . Alcohol Use: Yes      Comment: rare, social   . Drug Use: No   . Sexually Active: Not on file     Other  Topics Concern   . Not on file     Social History Narrative    She is employed as a Engineer, agricultural. She is married and lives with her husband Molly Maduro).  They have 2 children.  She exercises using a recumbent bike.  She enjoys golfing.      Review of Systems   Constitutional: Negative for fever.   Eyes: Negative for blurred vision.   Respiratory: Negative for cough and shortness of breath.    Cardiovascular: Negative for chest pain and palpitations.   Gastrointestinal: Positive for abdominal pain. Negative for heartburn, nausea, vomiting, diarrhea and constipation.   Genitourinary: Negative for dysuria.   Skin: Negative for rash.   Neurological: Positive for headaches.   Endo/Heme/Allergies: Does not bruise/bleed easily.        BP 129/82  Pulse 76  Temp(Src) 36.4 C (97.6 F) (Temporal)  Resp 14  Ht 1.651 m (5\' 5" )  Wt 113.399 kg (250 lb)  BMI 41.6 kg/m2  SpO2 98%  LMP 03/19/2012  Physical Exam   Constitutional: She is oriented to person, place, and time. No distress.   Gwenn is morbidly obese with a BMI of 41.6.   HENT:   Head: Normocephalic and atraumatic.   Mouth/Throat: No oropharyngeal exudate.   Eyes: Conjunctivae are normal. Pupils are equal, round, and reactive to light. No scleral icterus.   Neck: Normal range of motion. Neck supple.   Cardiovascular: Normal rate and regular rhythm.    Pulmonary/Chest: Effort normal and breath sounds normal. No respiratory distress.   Abdominal: Bowel sounds are normal. She exhibits no distension. There is no hepatosplenomegaly. There is no tenderness. There is no rebound, no guarding and negative Murphy's sign. No hernia.   Musculoskeletal: Normal range of motion. She exhibits no edema and no tenderness.   Neurological: She is alert and oriented to person, place, and time. She exhibits normal muscle tone.   Skin: Skin is warm  and dry. No rash noted. No erythema. No pallor.   Psychiatric: She has a normal mood and affect. Her behavior is normal. Thought content  normal.        IR Lab Values From Results Review Latest Ref Rng 07/20/2010 07/01/2010   HCT 34-45 %  40   HGB 11.2-15.7 g/dL  16.1   PLT 096-045 THOU/uL  252   INR 0.9-1.1  0.9   K 3.3-5.1 mmol/L  4.2   NA 133-145 mmol/L  138   UN 6-20 mg/dL  13   CREAT 4.09-8.11 mg/dL  9.14   GFRB   > 59   GFRC   > 59   ALB 3.5-5.2 g/dL  4.2   AST 0 - 35 U/L 17 15   ALT 0 - 35 U/L 14 19   TB 0.0-1.2 mg/dL  0.4   ALK 78-295 U/L  51   IMPRESSION:      Cholelithiasis without sonographic evidence of acute cholecystitis.      Simple appearing hepatic and renal cysts, as above. Punctate    calcification at the lower pole of the right kidney appears to be    associated with a small cyst, and may represent a cyst with milk of    calcium, non-shadowing calculus or vascular calcification.      Pancreas was not well visualized due to shadowing produced by    overlying bowel gas.    Assessment:   Maybeline has a classic biliary colic.  Her symptoms are associated with fatty food intake and appear to be slowly worsening over time.  I recommend a laparoscopic cholecystectomy because of her high lifetime risk of worsening symptoms and needing intervention.  We discussed that alternatives a strict low-fat diet.   Plan:   Dorcie is very motivated to postpone surgery until the end of the academic year.  I believe that this is safe, with a small statistical risk of needing to have intervention.  As it stands, at her request we will plan for a laparoscopic cholecystectomy on an ambulatory basis at the end of May.  We discussed the risks benefits alternatives to surgical intervention.  She was given a printout on a low-fat diet which we strongly encouraged.  She is also given printed information regarding the laparoscopic cholecystectomy procedure.  Her questions were answered and she demonstrated full understanding.       Dictated with Dragon and every reasonable attempt has been made to ensure accuracy.

## 2012-07-20 ENCOUNTER — Encounter: Payer: Self-pay | Admitting: Gastroenterology

## 2012-09-30 ENCOUNTER — Other Ambulatory Visit: Payer: Self-pay | Admitting: Primary Care

## 2012-10-03 ENCOUNTER — Encounter: Payer: Self-pay | Admitting: Primary Care

## 2012-10-03 ENCOUNTER — Ambulatory Visit: Payer: Self-pay | Admitting: Primary Care

## 2012-10-03 VITALS — BP 130/80 | HR 60 | Resp 14 | Ht 64.96 in | Wt 242.0 lb

## 2012-10-03 NOTE — Progress Notes (Signed)
CC--f/u      HPI    Gallstones--pt electively scheduled for cholecystectomy --reviewed stones on ultrasound and she does get ruq pains with fatty meals-I did review that it was somewhat atypical she will complain of pain more right lateral upper quadrant with fatty foods.    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  She is on occasional when necessary use of Macrobid through her GYN for urinary tract infections she does not know the dose  Review of systems    Feeling fine  No fever  No headache  No chest pain  No shortness of breath  No cough  No change of appetite  No nausea  No vomiting  No abdominal pain  No stool changes  No urinary symptoms        Current Outpatient Prescriptions   Medication Sig Dispense Refill   . Nitrofurantoin Monohyd Macro (MACROBID PO) Take by mouth   Prn through gyn       . buPROPion (WELLBUTRIN XL) 150 MG 24 hr tablet TAKE 1 TABLET BY MOUTH EVERY MORNING SWALLOW WHOLE, DO NOT BREAK,CRUSH OR CHEW  30 tablet  2   . Calcium 500 MG tablet Take 1 tablet by mouth 2 times daily       . Cholecalciferol (VITAMIN D3) 1000 UNIT tablet Take 1,000 Units by mouth every morning          . rizatriptan (MAXALT) 10 MG tablet Take 10 mg by mouth as needed   TAKE 1 TABLET AT ONSET OF HEADACHE. MAY REPEAT EVERY 2 HOURS AS NEEDED. MAXIMUM 3 TABLETS IN 24 HOURS.       Marland Kitchen Ibuprofen (ADVIL PO) Take 600 mg by mouth every 6 hours as needed            No current facility-administered  medications for this visit.         Filed Vitals:    10/03/12 0724   BP: 130/80   Pulse: 60   Resp: 14   Height: 1.65 m (5' 4.96")   Weight: 109.77 kg (242 lb)         EXAM  Gen: patient appears comfortable and in no distress  Cv: RRR, Nl S1,S2  Resp: CTA b/l, no rales, no rhonchi  Ext: no edema, subjective plan less sensation to left lateral foot but normal monofilament sensation normal strength of ankle dorsiflexion plantar flexion.          A/P pleasant female seen here for followup of multiple issues.    Patient with known gallstones likely getting surgery in the near future electively for gallstones.  She is aware it is elected if she does have a risk of increased risk of more severe attacks as time goes on.  Reviewed she can review further again with general surgery.    Mood doing well recommend she use the full spectrum lights.  She feels her mood is good at present no medication changes needed.    Left-sided numbness ever since she had her aneurysm rupture years ago as a teenager symptoms are generally stable were a little worse in the cold this winter but in the past has always gotten worse when she was sick or not feeling well symptoms are back to baseline at present offered her week and have her 3 see neurology  if she would like.      She will see me in 3-4 months we'll continue her current medications.  Thank you  Weight loss to continue to work on weight loss

## 2012-10-27 DIAGNOSIS — K811 Chronic cholecystitis: Secondary | ICD-10-CM

## 2012-10-27 HISTORY — DX: Chronic cholecystitis: K81.1

## 2012-11-08 ENCOUNTER — Encounter (INDEPENDENT_AMBULATORY_CARE_PROVIDER_SITE_OTHER): Payer: Self-pay

## 2012-11-09 ENCOUNTER — Ambulatory Visit: Payer: Self-pay | Admitting: Surgery

## 2012-11-09 ENCOUNTER — Encounter: Payer: Self-pay | Admitting: Surgery

## 2012-11-09 VITALS — BP 142/66 | HR 102 | Temp 96.0°F | Resp 18 | Ht 65.0 in | Wt 239.0 lb

## 2012-11-09 DIAGNOSIS — R109 Unspecified abdominal pain: Secondary | ICD-10-CM

## 2012-11-09 DIAGNOSIS — K8020 Calculus of gallbladder without cholecystitis without obstruction: Secondary | ICD-10-CM

## 2012-11-09 LAB — CBC
Hematocrit: 42 % (ref 34–45)
Hemoglobin: 12.9 g/dL (ref 11.2–15.7)
MCV: 85 fL (ref 79–95)
Platelets: 239 10*3/uL (ref 160–370)
RBC: 4.9 MIL/uL (ref 3.9–5.2)
RDW: 15.8 % — ABNORMAL HIGH (ref 11.7–14.4)
WBC: 6.9 10*3/uL (ref 4.0–10.0)

## 2012-11-09 LAB — COMPREHENSIVE METABOLIC PANEL
ALT: 18 U/L (ref 0–35)
AST: 18 U/L (ref 0–35)
Albumin: 4.4 g/dL (ref 3.5–5.2)
Alk Phos: 61 U/L (ref 35–105)
Anion Gap: 12 (ref 7–16)
Bilirubin,Total: 0.3 mg/dL (ref 0.0–1.2)
CO2: 28 mmol/L (ref 20–28)
Calcium: 9.2 mg/dL (ref 8.6–10.2)
Chloride: 102 mmol/L (ref 96–108)
Creatinine: 0.73 mg/dL (ref 0.51–0.95)
GFR,Black: 107 *
GFR,Caucasian: 93 *
Glucose: 96 mg/dL (ref 60–99)
Lab: 11 mg/dL (ref 6–20)
Potassium: 4.3 mmol/L (ref 3.3–5.1)
Sodium: 142 mmol/L (ref 133–145)
Total Protein: 7.5 g/dL (ref 6.3–7.7)

## 2012-11-09 NOTE — Patient Instructions (Addendum)
[facility]     NAME: [name]    SURGERY DATE: Wednesday, May 21     1. On the day before surgery CALL [phone] between 2:30 PM and 7 PM to find out: a): the time to arrive at the [facility] and b) the time of your procedure.    NOTE: Patients scheduled for a procedure on Monday should call the Friday before.    Please note surgery start time is approximate. You may want to bring something to help pass the time. PLEASE ARRIVE ON TIME.         2. DIRECTIONS TO [facility]: On the day of your procedure  park in the parking garage and take the elevator/stairs to Level One (1), then follow the walkway to the Main Lobby.  Walk past the Information Desk in the lobby, towards the Lab & Outpatient Services.  Follow the RED (R) ceiling tags to the RED elevators. (Valet parking is available outside the front entrance of the hospital between 6:00 AM and 5:00 PM.)    [facility] (B-Level): take the RED elevators to the Basement (Level B - Two floors down) to the [facility] and check in with the receptionist at the desk.       3. Before coming to the hospital, remove all makeup, (including mascara), jewelry (including wedding band and watch), hair accessories and nail polish from toes and fingers. Do not bring any valuables (money, wallet, purse, jewelry, or contact lenses).     Bring Photo ID        4. Call your surgeon if you become ill before the procedure day, i.e. fever, chills, nausea, vomiting, sore throat.       5. EATING GUIDELINES: Follow the instructions below unless directed by your surgeon. Starting:          Nothing to eat after midnight on the day of your surgery. No candy,gum, mints. You can have clear liquids up to 4 hours before your surgery. This includes water, apple juice, clear carbonated beverages, black coffee, clear tea. No milk, cream, or non dairy creamers.   Failure to follow these instructions, could  lead to a delay or cancellation of your procedure.       6. MEDICATIONS:    On the morning of surgery, take only the medications listed below at the usual time or before leaving for the  Hospital:  Wellbutrin  Maxalt if needed        Medications should only be taken with no more than one ounce of water.  You may take Tylenol (Acetaminophen) if needed.    Aspirin: Do not take any aspirin products for 7 days before the procedure date.  Anti-Inflammatory products: Do not take any non-steroidal anti-inflammatory agents such as Ibuprofen (Advil, Motrin) or Naproxen (Aleve) 7 days before the procedure.       7.  EXPECTED STAY:    (ASC) Same Day Surgery: You are going home on the same day as your surgery. Most people are ready for discharge one half hour to two hours after returning to the Surgical Center from the Post-Anesthesia Care Unit (PACU). You will be given discharge instructions before you go home.    Health standards require that a responsible adult must accompany any patient who has received anesthetics or sedation and is going home the same day.     You must arrange a ride home before coming to surgery.  8. Your family will be directed to a waiting area when you are taken to surgery.    We ask that only one or two family members accompany you on the day of your procedure.           No children under the age of 50 are allowed as visitors in [facility]    Your family will be notified when your surgery is completed and you have arrived on the patient care unit.        Surgical Site Infections FAQs    What is a Surgical Site infection (SSI)?  A surgical site infection is an infection that occurs after surgery In the part of the body where the surgery took place. Most patients who have surgery do not develop an infection. However, Infections develop in about 1 to 3 out of every 100 patients who have surgery.     Some of the common symptoms of a surgical site infection are:   Marland Kitchen Redness and pain  around the area where you had surgery   . Drainage of cloudy fluid from your surgical wound   . Fever     Can SSIs be treated?   Yes. Most surgical site infections can be treated with antibiotics. The antibiotic given to you depends on the bacteria (germs) causing the Infection. Sometimes patients with SSIs also need another surgery to treat the infection.     What are some of the things that hospitals are doing to prevent SSls?   To prevent SSIs, doctors, nurses, and other healthcare providers:   . Clean their hands and arms up to their elbows with an antiseptic agent just before the surgery.   . Clean their hands with soap and water or an alcohol-based hand rub before and after caring for each patient.   . May remove some of your hair Immediately before your surgery using electric clippers If the hair Is in the same area where the procedure will occur. They should not shave you with a razor.   . Wear special hair covers, masks, gowns, and gloves during surgery to keep the surgery area clean.   . Give you antibiotics before your surgery starts. in most cases, you should get antibiotics within 60 mInutes before the surgery starts and the antibiotics should be stopped within 24 hours after surgery.   . Clean the skin at the site of your surgery with a special soap that kills germs.     What can I do to help prevent SSIs?   Before your surgery:   . Tell your doctor about other medical problems you may have. Health problems such as allergies, diabetes, and obesity could affect your surgery and your treatment.   . Quit smoking. Patients who smoke get more Infections. Talk to your doctor about how you can quit before your surgery.   . Do not shave near where you will have surgery. Shaving with a razor can Irritate your skin and make it easier to develop an infection.   At the time of your surgery:   . Speak up if someone tries to shave you with a razor before surgery. Ask why you need to be shaved and talk with your  surgeon if you have any concerns.   . Ask if you will get antibiotics before surgery.   After your surgery:   . Make sure that your healthcare providers clean their hands before examining you, either with soap and water or an alcohol-based hand  rub,      If you do not see your healthcare providers wash their hands,   please ask them to do so.    . Family and friends who visit you should not touch the surgical wound or dressings.   . Family and friends should clean their hands with soap and water or an alcohol-based hand rub before and after visiting you. If you do not see them clean their hands, ask them to clean their hands.   What do I need to do when I go home from the hospital?   . Before you go home, your doctor or nurse should explain everyt hing you need to know about taking care of your wound. Make sure you understand how to care for your wound before you leave the hospital.   . Always clean your hands before and after caring for your wound.   . Before you go home, make sure you know who to contact If you have questions or problems after you get home.   . If you have any symptoms of an Infection, such as redness and pain at the surgery site, drainage, or fever, call your doctor immediately.   if you have additional questions, Please ask your doctor or nurse.

## 2012-11-15 ENCOUNTER — Encounter: Payer: Self-pay | Admitting: Surgery

## 2012-11-15 NOTE — Progress Notes (Signed)
Dear Dr. Octavio Graves,    We had the pleasure of seeing [name] in surgery clinic for continuing follow up with a history of biliary colic and gallstones.      [name] is a 57 y.o. female with a past medical history significant for Migraine headache with associated syncopal episodes, Recurrent UTI, Internal Hemorrhoids and benign colon polyp (found on routine colonoscopy 2013), Cerebral Aneurysm (age 30), Sleep apnea (no CPAP), OA, Depression and Anxiety.  Her comorbidities are stable.     Her symptoms consist of intermittent, recurrent episodes of RUQ abdominal pain after eating.  The symptoms are aggravated by intake of fatty foods. Symptoms lasts 20 minutes [name] 4 hours and are self limited.  The pain radiates [name] her back.  She reports associated nausea but no vomiting.  She denies fever, chills, biliuria or acholic stools. Workup included an abdominal ultrasound which revealed gallstones.      She presented for surgical consultation in January 2014.  Laparoscopic cholecystectomy was recommended.     Today she reports no change in her symptoms.  She has maintained a low fat diet since our last visit.  She still get occasional RUQ abdominal pain.      Review of Systems   Constitutional: Negative for fever, chills and weight loss.   HENT: Negative for sore throat.    Eyes: Negative for blurred vision.   Respiratory: Negative for cough, hemoptysis and shortness of breath.    Cardiovascular: Negative for chest pain, palpitations and leg swelling.   Gastrointestinal: Positive for nausea and abdominal pain. Negative for heartburn, vomiting, diarrhea, constipation, blood in stool and melena.   Genitourinary: Negative for dysuria, frequency and hematuria.   Skin: Negative for itching.   Neurological: Negative for dizziness, tingling, focal weakness, seizures and headaches.   Endo/Heme/Allergies: Does not bruise/bleed easily.   Psychiatric/Behavioral: Positive for depression. The patient is  nervous/anxious.        [name]  has a past medical history of Migraine; Syncope; Urinary tract infection; Internal hemorrhoids; Benign neoplasm of colon; Ruptured cerebral aneurysm; Complication of anesthesia; Vertigo; Calcaneal bursitis (01/14/2012); Sleep apnea; Mitral valve prolapse; OA (osteoarthritis); Depression; and Anxiety (3/12).    She  has past surgical history that includes strabismus correction (Right, 1972, 1993); Cesarean section, classic ('86 '88); Colonoscopy (2012); Wisdom tooth extraction (1978); and Knee arthroscopy (Left, 06/19/11).    She has a current medication list which includes the following prescription(s): nitrofurantoin monohyd macro, bupropion, calcium, vitamin d3, rizatriptan, and ibuprofen.    She is allergic [name] codeine.    She  reports that she has never smoked. She has never used smokeless tobacco.    [name]  reports that  drinks alcohol.    family history includes Asthma in her sister; Breast cancer in her mothers; Colon cancer in her maternal grandfather; Depression in her brother, daughter, mother, and sister; Heart disease in her father; Heart failure in her father; Ovarian cancer in her maternal grandmother; Stroke in her father, paternal grandfather, and paternal grandmother; and Substance abuse in her brother and sister.    She is employed as a Engineer, agricultural. She is married and lives with her husband [name]).  They have 2 children.  She exercises using a recumbent bike.  She enjoys golfing.     Physical Exam:  [name]'s  height is 1.651 m (5\' 5" ) and weight is 108.41 kg (239 lb). Her temperature is 35.6 C (96 F). Her blood pressure is 142/66 and her pulse is  102. Her respiration is 18 and oxygen saturation is 94%.     General Appearance:    Alert, cooperative, no distress, appears stated age   Head:    Normocephalic, without obvious abnormality, atraumatic   Eyes:    PERRL, conjunctiva/corneas clear, EOM's intact, benign, both eyes        Throat:   Lips, mucosa, and  tongue normal; teeth and gums normal   Neck:   Supple, symmetrical, trachea midline, no adenopathy;        thyroid:  No enlargement/tenderness/nodules; no carotid    bruit or JVD   Lungs:     Clear [name] auscultation bilaterally, respirations unlabored   Heart:    Regular rate and rhythm, S1 and S2 normal, no murmur, rub   or gallop   Abdomen:     Soft, non-tender, bowel sounds active all four quadrants,     no masses, no organomegaly. Healed incisions. .       Extremities:   Extremities normal, atraumatic, no cyanosis or edema          Assessment:        57 year old female with classic biliary colic and ultrasound proven gallstones.  Laparoscopic cholecystectomy is planned for 11/16/2012.      Plan:      1. Discussed the risk of surgery including bleeding, infection, blood clot, injury [name] adjacent structures, bile duct injury, and the risks of general anesthetic including MI, CVA, sudden death or even reaction [name] anesthetic medications. The patient understands the risks, any and all questions were answered [name] the patient's satisfaction.  2. Consent signed, preoperative teaching completed, patient verbalizes understanding of instructions, she does not object [name] blood transfusion, preoperative orders completed, labs [name] be repeated: CMP, CBC and patient [name] discontinue NSAIDS one week prior [name] surgery. .    She is anxious [name] proceed and will be seen again in follow up two weeks after surgery.    Best wishes,  Victoria Citizen, NP

## 2012-11-16 ENCOUNTER — Encounter: Payer: Self-pay | Admitting: Primary Care

## 2012-11-16 ENCOUNTER — Ambulatory Visit
Admit: 2012-11-16 | Discharge: 2012-11-16 | Disposition: A | Discharge status: Home / Self Care | Payer: Self-pay | Source: Ambulatory Visit | Attending: Surgical Oncology | Admitting: Surgical Oncology

## 2012-11-16 LAB — POCT URINE PREGNANCY: Lot #: 709005

## 2012-11-16 MED ORDER — LIDOCAINE HCL 1 % IJ SOLN
0.1000 mL | INTRAMUSCULAR | Status: DC | PRN
Start: 2012-11-16 — End: 2012-11-16
  Administered 2012-11-16: 0.1 mL via SUBCUTANEOUS

## 2012-11-16 MED ORDER — PROMETHAZINE HCL 25 MG/ML IJ SOLN *I*
INTRAMUSCULAR | Status: AC
Start: 2012-11-16 — End: 2012-11-16
  Filled 2012-11-16: qty 1

## 2012-11-16 MED ORDER — ONDANSETRON HCL 2 MG/ML IV SOLN *I*
4.0000 mg | Freq: Four times a day (QID) | INTRAMUSCULAR | Status: DC | PRN
Start: 2012-11-16 — End: 2012-11-17

## 2012-11-16 MED ORDER — LACTATED RINGERS IV SOLN *I*
20.0000 mL/h | INTRAVENOUS | Status: DC
Start: 2012-11-16 — End: 2012-11-16

## 2012-11-16 MED ORDER — SODIUM CHLORIDE 0.9 % IV SOLN WRAPPED *I*
20.0000 mL/h | Status: DC
Start: 2012-11-16 — End: 2012-11-16

## 2012-11-16 MED ORDER — HEPARIN SODIUM 5000 UNIT/ML SQ *I*
5000.0000 [IU] | Freq: Once | SUBCUTANEOUS | Status: AC
Start: 2012-11-16 — End: 2012-11-16
  Administered 2012-11-16: 5000 [IU] via SUBCUTANEOUS

## 2012-11-16 MED ORDER — PROMETHAZINE HCL 25 MG/ML IJ SOLN *I*
6.2500 mg | Freq: Once | INTRAMUSCULAR | Status: DC | PRN
Start: 2012-11-16 — End: 2012-11-16
  Administered 2012-11-16: 6.25 mg via INTRAVENOUS

## 2012-11-16 MED ORDER — OXYCODONE-ACETAMINOPHEN 5-325 MG PO TABS *I*
1.0000 | ORAL_TABLET | ORAL | Status: DC | PRN
Start: 2012-11-16 — End: 2012-11-16

## 2012-11-16 MED ORDER — OXYCODONE-ACETAMINOPHEN 5-325 MG PO TABS *I*
1.0000 | ORAL_TABLET | ORAL | Status: DC | PRN
Start: 2012-11-16 — End: 2012-11-17

## 2012-11-16 MED ORDER — HEPARIN SODIUM 5000 UNIT/ML SQ *I*
SUBCUTANEOUS | Status: AC
Start: 2012-11-16 — End: 2012-11-16
  Filled 2012-11-16: qty 1

## 2012-11-16 MED ORDER — ONDANSETRON HCL 2 MG/ML IV SOLN *I*
1.0000 mg | Freq: Once | INTRAMUSCULAR | Status: DC | PRN
Start: 2012-11-16 — End: 2012-11-16

## 2012-11-16 MED ORDER — CEFAZOLIN IN D5W 1 GM/50ML IV SOLN *I*
1000.0000 mg | INTRAVENOUS | Status: AC
Start: 2012-11-16 — End: 2012-11-16
  Administered 2012-11-16: 2000 mg via INTRAVENOUS

## 2012-11-16 MED ORDER — HYDROMORPHONE HCL 2 MG/ML IJ SOLN *WRAPPED*
0.4000 mg | INTRAMUSCULAR | Status: DC | PRN
Start: 2012-11-16 — End: 2012-11-16

## 2012-11-16 MED ORDER — OXYCODONE-ACETAMINOPHEN 5-325 MG PO TABS *I*
2.0000 | ORAL_TABLET | ORAL | Status: DC | PRN
Start: 2012-11-16 — End: 2012-11-17

## 2012-11-16 MED ORDER — OXYCODONE-ACETAMINOPHEN 5-325 MG PO TABS *I*
1.0000 | ORAL_TABLET | ORAL | Status: DC | PRN
Start: 2012-11-16 — End: 2012-12-13

## 2012-11-16 MED ORDER — LACTATED RINGERS IV SOLN *I*
125.0000 mL/h | INTRAVENOUS | Status: DC
Start: 2012-11-16 — End: 2012-11-16
  Administered 2012-11-16 (×2): 125 mL/h via INTRAVENOUS

## 2012-11-16 MED ORDER — PROMETHAZINE HCL 25 MG/ML IJ SOLN *I*
INTRAMUSCULAR | Status: DC
Start: 2012-11-16 — End: 2012-11-17
  Filled 2012-11-16: qty 1

## 2012-11-16 MED ORDER — SCOPOLAMINE BASE 1.5 MG TD PT72 *I*
MEDICATED_PATCH | TRANSDERMAL | Status: AC
Start: 2012-11-16 — End: 2012-11-16
  Filled 2012-11-16: qty 1

## 2012-11-16 NOTE — Discharge Instructions (Signed)
PATIENT INSTRUCTIONS   LAPAROSCOPY    FOLLOW-UP:  Call to make an appointment to see Dr. Greenleigh Kauth or Joclyn Gaston for 3 weeks ([phone]).  Call  immediately if you have any fevers greater than 102.5, drainage from you wound that is not clear or looks infected, persistent bleeding, increasing abdominal pain, problems urinating, or persistent nausea/vomiting.      WOUND CARE INSTRUCTIONS:  If clothing rubs against the wounds or causes irritation and the wound is not draining you may cover it with a dry dressing during the daytime.  Try to keep the wound dry and avoid ointments on the wound unless directed to do so.  If the wound becomes bright red and painful or starts to drain infected material that is not clear, please contact your physician immediately.  If the wound is mildly pink and has a thick firm ridge underneath it, this is normal, and is referred to as a healing ridge.  This will resolve over the next 4-6 weeks.    DIET:  You may eat any foods that you can tolerate. It is a good idea to drink plenty of fluids for the first day or two.  It is a good idea to eat a high fiber diet and take in plenty of fluids to prevent constipation.  If you do become constipated you may want to take a mild laxative or take ducolax tablets on a daily basis until your bowel habits are regular.  Constipation can be very uncomfortable, along with straining, after recent abdominal surgery.    ACTIVITY:  You are encouraged to cough and deep breath .  This will help prevent respiratory complications and low grade fevers post-operatively.  You may want to hug a pillow when coughing and sneezing to add additional support to the surgical area which will decrease pain during these times.  You are encouraged to walk and engage in light activity for the next two weeks.  You should not lift more than 40 pounds during this time frame .      MEDICATIONS:  Try to take narcotic medications and anti-inflammatory medications, such as  tylenol, ibuprofen, naprosyn, etc., with food.  This will minimize stomach upset from the medication.  Should you develop nausea and vomiting from the pain medication, or develop a rash, please discontinue the medication and contact your physician.  You should not drive, make important decisions, or operate machinery when taking narcotic pain medication.    QUESTIONS:  Please feel free to call us or the hospital operator if you have any questions, and they will be glad to assist you.

## 2012-11-16 NOTE — H&P (Signed)
Dear Dr. Octavio Graves,    We had the pleasure of seeing [name] in surgery clinic for continuing follow up with a history of biliary colic and gallstones.       [name] is a 57 y.o. female with a past medical history significant for Migraine headache with associated syncopal episodes, Recurrent UTI, Internal Hemorrhoids and benign colon polyp (found on routine colonoscopy 2013), Cerebral Aneurysm (age 89), Sleep apnea (no CPAP), OA, Depression and Anxiety.  Her comorbidities are stable.     Her symptoms consist of intermittent, recurrent episodes of RUQ abdominal pain after eating.  The symptoms are aggravated by intake of fatty foods. Symptoms lasts 20 minutes to 4 hours and are self limited.  The pain radiates to her back.  She reports associated nausea but no vomiting.  She denies fever, chills, biliuria or acholic stools. Workup included an abdominal ultrasound which revealed gallstones.      She presented for surgical consultation in January 2014.  Laparoscopic cholecystectomy was recommended.     Today she reports no change in her symptoms.  She has maintained a low fat diet since our last visit.  She still get occasional RUQ abdominal pain.      Review of Systems   Constitutional: Negative for fever, chills and weight loss.   HENT: Negative for sore throat.    Eyes: Negative for blurred vision.   Respiratory: Negative for cough, hemoptysis and shortness of breath.    Cardiovascular: Negative for chest pain, palpitations and leg swelling.   Gastrointestinal: Positive for nausea and abdominal pain. Negative for heartburn, vomiting, diarrhea, constipation, blood in stool and melena.   Genitourinary: Negative for dysuria, frequency and hematuria.   Skin: Negative for itching.   Neurological: Negative for dizziness, tingling, focal weakness, seizures and headaches.   Endo/Heme/Allergies: Does not bruise/bleed easily.   Psychiatric/Behavioral: Positive for depression. The patient is  nervous/anxious.        [name]  has a past medical history of Migraine; Syncope; Urinary tract infection; Internal hemorrhoids; Benign neoplasm of colon; Ruptured cerebral aneurysm; Complication of anesthesia; Vertigo; Calcaneal bursitis (01/14/2012); Sleep apnea; Mitral valve prolapse; OA (osteoarthritis); Depression; and Anxiety (3/12).    She  has past surgical history that includes strabismus correction (Right, 1972, 1993); Cesarean section, classic ('86 '88); Colonoscopy (2012); Wisdom tooth extraction (1978); and Knee arthroscopy (Left, 06/19/11).    She has a current medication list which includes the following prescription(s): nitrofurantoin monohyd macro, bupropion, calcium, vitamin d3, rizatriptan, and ibuprofen.    She is allergic to codeine.    She  reports that she has never smoked. She has never used smokeless tobacco.    [name]  reports that  drinks alcohol.    family history includes Asthma in her sister; Breast cancer in her mothers; Colon cancer in her maternal grandfather; Depression in her brother, daughter, mother, and sister; Heart disease in her father; Heart failure in her father; Ovarian cancer in her maternal grandmother; Stroke in her father, paternal grandfather, and paternal grandmother; and Substance abuse in her brother and sister.    She is employed as a Engineer, agricultural. She is married and lives with her husband [name]).  They have 2 children.  She exercises using a recumbent bike.  She enjoys golfing.     Physical Exam:  [name]'s  height is 1.651 m (5\' 5" ) and weight is 108.41 kg (239 lb). Her temperature is 35.6 C (96 F). Her blood pressure is 142/66 and her pulse  is 102. Her respiration is 18 and oxygen saturation is 94%.       General Appearance:     Alert, cooperative, no distress, appears stated age    Head:     Normocephalic, without obvious abnormality, atraumatic    Eyes:     PERRL, conjunctiva/corneas clear, EOM's intact, benign, both eyes         Throat:    Lips, mucosa,  and tongue normal; teeth and gums normal    Neck:    Supple, symmetrical, trachea midline, no adenopathy;         thyroid:  No enlargement/tenderness/nodules; no carotid    bruit or JVD    Lungs:      Clear to auscultation bilaterally, respirations unlabored    Heart:     Regular rate and rhythm, S1 and S2 normal, no murmur, rub   or gallop    Abdomen:      Soft, non-tender, bowel sounds active all four quadrants,      no masses, no organomegaly. Healed incisions. .        Extremities:    Extremities normal, atraumatic, no cyanosis or edema            Assessment:         57 year old female with classic biliary colic and ultrasound proven gallstones.  Laparoscopic cholecystectomy is planned for 11/16/2012.       Plan:       1. Discussed the risk of surgery including bleeding, infection, blood clot, injury to adjacent structures, bile duct injury, and the risks of general anesthetic including MI, CVA, sudden death or even reaction to anesthetic medications. The patient understands the risks, any and all questions were answered to the patient's satisfaction.  2. Consent signed, preoperative teaching completed, patient verbalizes understanding of instructions, she does not object to blood transfusion, preoperative orders completed, labs to be repeated: CMP, CBC and patient to discontinue NSAIDS one week prior to surgery. .    She is anxious to proceed and will be seen again in follow up two weeks after surgery.    Best wishes,  Rayfield Citizen, NP      UPDATES TO PATIENT'S CONDITION on the DAY OF SURGERY/PROCEDURE   I. Updates to Patient's Condition (to be completed by a provider privileged to complete a H&P, following reassessment of the patient by the provider):   Day of Surgery/Procedure Update:   History   History reviewed and no change   Physical   Physical exam updated and no change       II. Procedure Readiness   I have reviewed the patient's H&P and updated condition. By completing and signing this form, I attest  that this patient is ready for surgery/procedure.   III. Attestation   I have reviewed the updated information regarding the patient's condition and it is appropriate to proceed with the planned surgery/procedure.   Donnamarie Poag, MD 11/16/2012

## 2012-11-16 NOTE — Progress Notes (Signed)
Pt arrives to ssc c/o nausea writer kept pt in bed iv positional writer unable to get iv to run better.  Pt now vomiting dry heaves writer gave pt phenergan 6.25 mg.  Iv still positional writer restarted iv with 20 gauge in left hand and d/c iv in right hand.  Pt given 6.25 mg of phenergan through patent iv.  Pt lap sites x4 dry and intact with dermabond.  Pt c/o nausea and phenergan running in, given emesis basin no emesis obtained. scoplamine patch on right  Upper chest .  1359  Pt sleeping and husband present and given rx for pain and explaination as to why it took a while for him to come back and see to  and husband understands.  Pt on oxygen 2 lnp and o2 94-95 %.  Pt is feeling better. Sleeping.   Lap sites x4 on abdomen dry and intact with dermabond.   Report given to sandy price rn

## 2012-11-16 NOTE — Op Note (Signed)
Operative Note    Date of Surgery: 11/16/2012  Surgeon:  Brandy Hale MD PhD FACS  Co-Surgeon:    First Assistant: Jacelyn Grip MD Res  Second Assistant:      Pre-Op Diagnosis: Biliary colic    Anesthesia Type: General    Post-Op Diagnosis:    Primary: same  Secondary:    Tertiary:      Additional Findings (Including unexpected complications): none    Procedure(s) Performed (including CPT 4 Code if available)   Laparoscopic cholecystectomy    Estimated Blood Loss: 5 ml   Packing: No  Drains:  No,   Fluid Totals:  Intakes: See Anesthesia Record       Outputs: See Anesthesia Record   Specimens to Pathology: yes  Patient Condition: good  INDICATIONS:  Patient has a  history of symptomatic gallbladder disease.  Risks of surgery including but not limited to bleeding, infection, injury to surrounding structures, including bile duct injury, and anesthetic complications and lack of relief from preoperative symptoms and post cholecystectomy syndrome were discussed.  Treatment alternatives including expectant management with dietary modification were discussed.  Alternative surgical techniques were also discussed.  Informed consent for this procedure was obtained.    OPERATIVE PROCEDURE:  Following general anesthesia in supine position, the patient's abdomen was prepped and draped in usual fashion, which included using intravenous antibiotic prophylaxis within 60 minutes of skin incision, ChloraPrep, and a surgical time-out. A Veress needle was inserted through an umbilical puncture and after easy pneumoperitoneum was confirmed, a non bladed 11 mm optical trocar was guided into the abdomen.  Under direct vision, 3 5mm upper abdominal ports were placed.  Diagnostic laparoscopy revealed marked obesity but no fatty liver.    The liver was elevated by the falciform ligament  The gallbladder was taken down from the liver bed, using the Harmonic scalpel.  The Harmonic scalpel was then used to incise the peritoneum circumferentially  around the triangle of Calot. Blunt dissection very nicely revealed the critical view of safety.  The cystic artery was controlled with the Harmonic scalpel.  The cystic duct was then controlled with 2 applications of an 0 PDS Endoloop on the patient's side.  It should be noted that the critical view of safety was obtained prior to transection of either cystic structure.  The cystic duct was then transected on the specimen side.  Fifteen mL of Marcaine were placed in the right hemidiaphragm.  The specimen was withdrawn through the 11mm port which was closed using 0-PDS and the endoclose.  Skin was closed with running and interrupted 4-0 Monocryl in the subcu.  Dermabond was placed.       Blood loss was 5cc.  Topical and local anesthesia was placed.  Dr. Emeline General was present throughout.  Counts were correct.  Patient tolerated the procedure well.

## 2012-11-16 NOTE — Anesthesia Post-procedure Eval (Signed)
Anesthesia Post-op Note    Patient: [name]    Procedure(s) Performed: as per surgeon    Anesthesia type: General    Patient location: PACU    Mental Status: Recovered to baseline    Patient able to participate in this evaluation: yes  Last Vitals: BP: 117/56 mmHg (11/16/12 1300)  BP MAP : 71 mmHg (11/16/12 1300)  Heart Rate: 89 (11/16/12 1300)  Temp: 36.4 C (97.5 F) (11/16/12 1300)  Resp: 16 (11/16/12 1300)  Height: 165.1 cm (5\' 5" ) (11/16/12 0719)  Weight: 106.6 kg (235 lb 0.2 oz) (11/16/12 0719)  BMI (Calculated): 39.2 (11/16/12 0719)  SpO2: 96 % (11/16/12 1300)      Post-op vital signs noted above are within patient's normal range  Post-op vitals signs: stable  Respiratory function: baseline    Airway patent: Yes    Cardiovascular and hydration status stable: Yes    Post-Op pain: Adequate analgesia    Post-Op nausea and vomiting: none    Post-Op assessment: no apparent anesthetic complications    Complications: none    Attending Attestation: All indicated post anesthesia care provided    Author: Gomez Cleverly, MD  as of: 11/16/2012  at: 1:08 PM

## 2012-11-16 NOTE — INTERIM OP NOTE (Signed)
Interim Operative Note    Date of Surgery: 11/16/2012  Surgeon:  Brandy Hale MD PhD FACS  Co-Surgeon:    First Assistant: Jacelyn Grip MD Res  Second Assistant:      Pre-Op Diagnosis: Biliary colic    Anesthesia Type: General    Post-Op Diagnosis:    Primary: same  Secondary:    Tertiary:      Additional Findings (Including unexpected complications): none    Procedure(s) Performed (including CPT 4 Code if available)   Laparoscopic cholecystectomy    Estimated Blood Loss: 5 ml   Packing: No  Drains:  No,   Fluid Totals:  Intakes: See Anesthesia Record       Outputs: See Anesthesia Record   Specimens to Pathology: yes  Patient Condition: good

## 2012-11-16 NOTE — Anesthesia Pre-procedure Eval (Signed)
Anesthesia Pre-operative Evaluation for [facility]      Health History  Past Medical History   Diagnosis Date   . Migraine      q2-3 months   . Syncope      d/t migraines   . Urinary tract infection      frequent  3 this year   . Internal hemorrhoids    . Benign neoplasm of colon    . Ruptured cerebral aneurysm      age 57   . Complication of anesthesia      PONV   . Vertigo    . Calcaneal bursitis 01/14/2012     right   . Sleep apnea      does not use CPAP   . Mitral valve prolapse    . OA (osteoarthritis)      left knee   . Depression    . Anxiety 3/12     Past Surgical History   Procedure Laterality Date   . Strabismus correction Right 1972, 1993     x2--child + 4 yo   . Cesarean section, classic  '86 '88     spinal   . Colonoscopy  2012   . Wisdom tooth extraction  1978     4 wisdom and B/L bicuspids   . Knee arthroscopy Left 06/19/11     Social History  History   Substance Use Topics   . Smoking status: Never Smoker    . Smokeless tobacco: Never Used   . Alcohol Use: Yes     1 Glasses of wine per week      Comment: 2 drinks/month      History   Drug Use No     ______________________________________________________________________  Allergies:   Allergies   Allergen Reactions   . Codeine Nausea And Vomiting     Prior to Admission Medications              Last Dose Start Date End Date Provider     Calcium 500 MG tablet 11/15/2012 at 0800  07/23/10  --  Provider, Conversion     Cholecalciferol (VITAMIN D3) 1000 UNIT tablet 11/15/2012 at 0800  07/23/10  --  Provider, Conversion     Ibuprofen (ADVIL PO) 11/02/12  --  --  [provider]     Nitrofurantoin Monohyd Macro (MACROBID PO) More than a month at Unknown time  --  --  [provider]     acetaminophen (TYLENOL) 500 mg tablet Past Week at Unknown time  --  --  [provider]     buPROPion (WELLBUTRIN XL) 150 MG 24 hr tablet 11/16/2012 at 0615  09/30/12  --  Gwenette Greet, MD     TAKE 1 TABLET BY MOUTH EVERY MORNING  SWALLOW WHOLE, DO NOT BREAK,CRUSH OR CHEW     rizatriptan (MAXALT) 10 MG tablet More than a month at Unknown time  07/08/11  10/03/18  Gwenette Greet, MD        Current Outpatient Prescriptions   Medication Sig Note   . acetaminophen (TYLENOL) 500 mg tablet Take 500 mg by mouth every 6 hours as needed for Pain    . buPROPion (WELLBUTRIN XL) 150 MG 24 hr tablet TAKE 1 TABLET BY MOUTH EVERY MORNING SWALLOW WHOLE, DO NOT BREAK,CRUSH OR CHEW 09/30/2012: AM   . Calcium 500 MG tablet Take 1 tablet by mouth 2 times daily    . Cholecalciferol (VITAMIN D3) 1000 UNIT tablet Take 1,000  Units by mouth every morning       . Nitrofurantoin Monohyd Macro (MACROBID PO) Take by mouth   Prn through gyn    . rizatriptan (MAXALT) 10 MG tablet Take 10 mg by mouth as needed   TAKE 1 TABLET AT ONSET OF HEADACHE. MAY REPEAT EVERY 2 HOURS AS NEEDED. MAXIMUM 3 TABLETS IN 24 HOURS.    Marland Kitchen Ibuprofen (ADVIL PO) Take 600 mg by mouth every 6 hours as needed         Current Facility-Administered Medications   Medication   . Lactated Ringers Infusion   . sodium chloride 0.9 % IV   . lidocaine 1 % injection 0.1 mL   . Lactated Ringers Infusion   . ceFAZolin in D5W SOLN 1,000 mg   . heparin (porcine) 5000 unit/ml SQ injection   . scopolamine (TRANSDERM-SCOP) 1.5 MG patch     Admission Medications:  Scheduled MedsIV MedsPRN Meds  Anesthesia EvaluationInformation Source: per patient, per records  General    + History of anesthetic complications            PONV    + Obesity        Pulmonary     + Snoring    + Sleep apnea  Pertinent (-):  smoking Cardiovascular  Good(4+METs) Exercise Tolerance  Pertinent (-):  DOE or vascular Issues    GI/Hepatic/Renal  Last PO Intake: >8hr before procedure    Comment: Cholelithiasis  Neuro/Psych    + Headaches            migraines    + Psychiatric Issues          depression    + Cerebrovascular event (ruptured cerebral aneurysm at age 53)    Endo/Other  Pertinent (-):  diabetes mellitus         Nursing Reported PO  Status: Date Last PO Fluids: 11/16/12 0615 (sip of water)  Date Last PO Solids: 11/15/12 2200  ______________________________________________________________________  Physical Exam    Airway            Mouth opening: normal            Mallampati: II            TM distance (fb): >3 FB  Dental   Normal Exam   Cardiovascular           Rhythm: regular           Rate: normal    General Survey    Normal Exam   Pulmonary   pulmonary exam normal    breath sounds clear to auscultation    Mental Status   Normal  evaluation    oriented to person, place and time         Most Recent Vitals: BP: 135/60 mmHg (11/16/12 0719)  Heart Rate: 104 (11/16/12 0719)  Temp: 36.6 C (97.9 F) (11/16/12 0719)  Resp: 16 (11/16/12 0719)  Height: 165.1 cm (5\' 5" ) (11/16/12 0719)  Weight: 106.6 kg (235 lb 0.2 oz) (11/16/12 0719)  BMI (Calculated): 39.2 (11/16/12 0719)  SpO2: 98 % (11/16/12 0719)    Vital Sign Ranges (last 24hrs)  Temp:  [36.6 C (97.9 F)] 36.6 C (97.9 F)  Heart Rate:  [104] 104  Resp:  [16] 16  BP: (135)/(60) 135/60 mmHg   O2 Device: None (Room air) (11/16/12 0719)    Most Recent Lab Results   Blood Type  Lab Results   Component Value Date    ABORH O RH POS  07/01/2010    ABS Negative 07/01/2010   CBC  Lab Results   Component Value Date    WBC 6.9 11/09/2012    HCT 42 11/09/2012    PLT 239 11/09/2012   Chem-7  Lab Results   Component Value Date    NA 142 11/09/2012    K 4.3 11/09/2012    CL 102 11/09/2012    CO2 28 11/09/2012    UN 11 11/09/2012    CREAT 0.73 11/09/2012    GLU 96 11/09/2012   Estimated Creatinine Clearance: 106.81 ml/min (based on Cr of 0.73).  Electrolytes  Lab Results   Component Value Date    CA 9.2 11/09/2012   Coags  Lab Results   Component Value Date    PTI 12.2 07/01/2010    INR 0.9 07/01/2010    PTT 26.2 07/01/2010   LFTs  Lab Results   Component Value Date    AST 18 11/09/2012    ALT 18 11/09/2012    ALK 61 11/09/2012     Bilirubin,Direct   Date Value Range Status   07/01/2010 < 0.2  0.0-0.3 mg/dL Final        Bilirubin,Total    Date Value Range Status   11/09/2012 0.3  0.0 - 1.2 mg/dL Final     Pregnancy Status: Postmenarcheal [5]  Patient's last menstrual period was 02/28/2012.    Lab Results   Component Value Date    PUPT Negative-Dilute urine specimens may cause false negative urine pregnancy results... 11/16/2012    UPREG NEG 07/01/2010     ECG Results  No results found for this basename: rate,  PR,  statement     ANES CPM    Radiology: No relevant studies     ________________________________________________________________________  Medical Problems  Patient Active Problem List    Diagnosis Date Noted   . Abdominal pain 07/19/2012   . Gallstones 07/19/2012   . Tear of medial cartilage or meniscus of knee, current 12/03/2010   . Osteoarthritis, knee 12/03/2010   . Left knee DJD 11/07/2010   . Depression 11/07/2010   . Vitamin D Deficiency 07/23/2010   . Sleep Apnea 02/22/2007     [facility] Annotation: Mar 15 2007  9:05PM - [place], [place]: dr [place]  cpap       . Overweight 04/05/2003            . Migraine Headache 04/05/2003            . Rosacea 04/05/2003            . 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                PreOp/PreProcedure Diagnosis (For more detail see procedural consent)            Cholelithiasis   Planned Procedure (For more detail see procedural consent)            Laparoscopic cholecystectomy  Plan   ASA Score 2  Anesthetic Plan (general); Induction (routine IV and RSI); Airway (cuffed ETT); Line ( use current access); Monitoring (standard ASA); Positioning (supine); Pain (per surgical team); PostOp (PACU)    Informed Consent     Risks:          Risks discussed were commensurate with the plan listed above with the following specific points:  N/V, aspiration, sore throat , damage to:(eyes, nerves, teeth), awareness, unexpected serious injury, allergic Rx    Anesthetic Consent:  Anesthetic plan and risks discussed with:  patient    Plan discussed with:  resident and surgeon      Attending Attestation:  The patient or proxy understand and accept the risks and benefits of the anesthesia plan. By accepting this note, I attest that I have personally performed the history and physical exam and prescribed the anesthetic plan within 48 hours prior to the anesthetic as documented by me above.    Author: Gomez Cleverly, MD

## 2012-11-17 MED FILL — Neomycin-Polymyxin B GU Irrigation Soln: Qty: 2 | Status: AC

## 2012-11-17 MED FILL — Fentanyl Citrate Preservative Free (PF) Inj 100 MCG/2ML: INTRAMUSCULAR | Qty: 2 | Status: AC

## 2012-11-17 MED FILL — Bupivacaine HCl Inj 0.25%: INTRAMUSCULAR | Qty: 30 | Status: AC

## 2012-11-18 ENCOUNTER — Encounter: Payer: Self-pay | Admitting: Primary Care

## 2012-11-18 LAB — SURGICAL PATHOLOGY

## 2012-11-29 ENCOUNTER — Encounter (INDEPENDENT_AMBULATORY_CARE_PROVIDER_SITE_OTHER): Payer: Self-pay

## 2012-12-06 ENCOUNTER — Ambulatory Visit: Payer: Self-pay | Admitting: Surgical Oncology

## 2012-12-08 ENCOUNTER — Encounter: Payer: Self-pay | Admitting: Primary Care

## 2012-12-13 ENCOUNTER — Ambulatory Visit: Payer: Self-pay | Admitting: Surgery

## 2012-12-13 VITALS — BP 142/89 | HR 97 | Temp 97.4°F | Resp 16 | Ht 65.0 in | Wt 232.0 lb

## 2012-12-13 DIAGNOSIS — Z9049 Acquired absence of other specified parts of digestive tract: Secondary | ICD-10-CM

## 2012-12-13 NOTE — Progress Notes (Signed)
Subjective:       [name] presents to the clinic 4 weeks following laparoscopic cholecystectomy (11/16/2012).     She tolerated her procedure well and was discharged to home the same day. She did experience PONV and throat irritation that lasted < 24 hours.      She is eating a regular diet without difficulty. Bowel movements are Normal. She is voiding without difficulty.  She reports minimal postoperative pain and only required ibuprofen for postoperative pain.  The patient is not having any pain..      She denies fever, chills or recurrent preoperative pain symptoms.  She denies erythema or drainage from the surgical sites.  She has returned to work without difficulty   Objective:      BP 142/89  Pulse 97  Temp(Src) 36.3 C (97.4 F) (Temporal)  Resp 16  Ht 1.651 m (5\' 5" )  Wt 105.235 kg (232 lb)  BMI 38.61 kg/m2  SpO2 97%    General:  alert, cooperative and no distress   Abdomen: soft, bowel sounds active, non-tender   Incision:   healing well, no drainage, no erythema, no hernia, no seroma, no swelling, no dehiscence, incision well approximated       Surgical Pathology   FINAL DIAGNOSIS:  Gallbladder, cholecystectomy:   - Chronic cholecystitis.   - Cholelithiasis.    Slides examined:  1        Assessment:      Doing well postoperatively.      Plan:     1. May resume usual activities as tolerated  2. Acetaminophen or Ibuprofen for mild discomfort  3. Follow up prn

## 2012-12-29 ENCOUNTER — Other Ambulatory Visit: Payer: Self-pay | Admitting: Primary Care

## 2013-01-19 ENCOUNTER — Encounter (INDEPENDENT_AMBULATORY_CARE_PROVIDER_SITE_OTHER): Payer: Self-pay

## 2013-01-20 ENCOUNTER — Ambulatory Visit: Payer: Self-pay | Admitting: Orthopedic Surgery

## 2013-01-20 ENCOUNTER — Encounter: Payer: Self-pay | Admitting: Orthopedic Surgery

## 2013-01-20 ENCOUNTER — Encounter: Payer: Self-pay | Admitting: Gastroenterology

## 2013-01-20 VITALS — BP 138/63 | Ht 65.0 in | Wt 228.0 lb

## 2013-01-20 DIAGNOSIS — M1710 Unilateral primary osteoarthritis, unspecified knee: Secondary | ICD-10-CM

## 2013-01-20 NOTE — Progress Notes (Signed)
Dictated

## 2013-01-20 NOTE — Progress Notes (Signed)
[name], [name] MR #:  [id number]   ACCOUNT #:  [id number] DOB:  1956-04-01   DICTATED BY:  Frances Nickels, PA DATE OF VISIT:  01/20/2013     CHIEF COMPLAINT:  Followup for left knee.    INTERVAL HISTORY:  The patient reports that she did well after the injection, last done on 06/21/2012, and it lasted about 3 months and she has just been putting up with it since then.  She comes in today because she is anticipating a trip to [place] very soon and would like to have some comfort for her knee.    PHYSICAL EXAMINATION:  Exam of her left knee today reveals small effusion, tenderness primarily medially, nontender laterally.  Range of motion 0-120.    ASSESSMENT AND PLAN:  I believe a corticosteroid injection would be helpful to her, and under sterile precautions, I injected the left knee using 80 mg Depo-Medrol, 6 cc 1% Xylocaine in the anterolateral approach to the joint.  She tolerated the procedure well, was given post-injection instructions and will return to the office on a p.r.n. basis.       Dictated By:  Frances Nickels, PA      ______________________________  Verita Schneiders, MD    LJS/MODL  DD:  01/20/2013 14:27:56  DT:  01/20/2013 16:10:22  Job #:  [id number]    cc: Verita Schneiders, MD   Gwenette Greet, MD   [address]   [place], [place] [address]

## 2013-01-29 ENCOUNTER — Other Ambulatory Visit: Payer: Self-pay | Admitting: Primary Care

## 2013-03-01 ENCOUNTER — Encounter: Payer: Self-pay | Admitting: Primary Care

## 2013-03-01 ENCOUNTER — Ambulatory Visit: Payer: Self-pay | Admitting: Primary Care

## 2013-03-01 VITALS — BP 125/82 | HR 70 | Ht 65.5 in | Wt 227.0 lb

## 2013-03-01 DIAGNOSIS — F32A Depression, unspecified: Secondary | ICD-10-CM

## 2013-03-01 DIAGNOSIS — E669 Obesity, unspecified: Secondary | ICD-10-CM

## 2013-03-01 NOTE — Progress Notes (Signed)
CCmood f/u      HPI--overall mood has been very good, was treated for depression a few times over life--mood is about 7.5/10. Sleep okay, energy is okay,, no anhedonia    Left knee--still bothersome at times  And sees ortho--not ready for knee replacement--will likely need a note from me in December as well the handicap permit unfortunately for her to get the close parking at the [facility].          Review of systems    Feeling fine  No fever  No headache  No chest pain  No shortness of breath  No cough  No change of appetite  No nausea  No vomiting  No abdominal pain  No stool changes  No urinary symptoms  No dizziness          Current Outpatient Prescriptions   Medication Sig Dispense Refill   . buPROPion (WELLBUTRIN XL) 150 MG 24 hr tablet TAKE 1 TABLET BY MOUTH EVERY MORNING SWALLOW WHOLE, DO NOT BREAK,CRUSH OR CHEW  30 tablet  1   . Nitrofurantoin Monohyd Macro (MACROBID PO) Take by mouth   Prn through gyn       . Calcium 500 MG tablet Take 1 tablet by mouth 2 times daily       . Cholecalciferol (VITAMIN D3) 1000 UNIT tablet Take 1,000 Units by mouth every morning          . rizatriptan (MAXALT) 10 MG tablet Take 10 mg by mouth as needed   TAKE 1 TABLET AT ONSET OF HEADACHE. MAY REPEAT EVERY 2 HOURS AS NEEDED. MAXIMUM 3 TABLETS IN 24 HOURS.       Marland Kitchen Ibuprofen (ADVIL PO) Take 600 mg by mouth every 6 hours as needed            No current facility-administered medications for this visit.         Filed Vitals:    03/01/13 1319   BP: 125/82   Pulse: 70   Height: 1.664 m (5' 5.5")   Weight: 102.967 kg (227 lb)         EXAM  Gen: patient appears comfortable and in no distress  Cv: RRR, Nl S1,S2  Resp: CTA b/l, no rales, no rhonchi  Ext: no edema          A/P pleasant female seen here for followup of her mood she is doing very well also with knee osteoarthritis followed by Ortho.  I reviewed reviewed with her I will give her a permit if needed in the fall due to the extensive distance of the regular parking for staff  at the hospital.  Recommend she remain on her current antidepressant medication as she is doing quite well with it and has had several episodes life time and significant family history of the same.  She is aware and agrees with the plan.  Thank you

## 2013-03-31 ENCOUNTER — Other Ambulatory Visit: Payer: Self-pay | Admitting: Primary Care

## 2013-05-23 ENCOUNTER — Ambulatory Visit: Payer: Self-pay | Admitting: Orthopedic Surgery

## 2013-05-23 ENCOUNTER — Encounter: Payer: Self-pay | Admitting: Orthopedic Surgery

## 2013-05-23 VITALS — BP 130/68 | Ht 65.0 in | Wt 215.4 lb

## 2013-05-23 DIAGNOSIS — M1710 Unilateral primary osteoarthritis, unspecified knee: Secondary | ICD-10-CM

## 2013-05-23 NOTE — Progress Notes (Signed)
PATIENTKATIMA, [name] MR #:  [id number]   ACCOUNT #:  [id number] DOB:  [birth year]   DICTATED BY:  Frances Nickels, PA DATE OF VISIT:  05/23/2013     CHIEF COMPLAINT:  Followup for left knee.    INTERVAL HISTORY:  The patient reports that she is experiencing an increase in symptoms in her left knee.  She last had a corticosteroid injection on 01/20/2013, and that helped a fair amount.  She denies any new injury.    PHYSICAL EXAMINATION:  Exam of her left knee reveals a small effusion.  Tenderness primarily along the medial joint line.  Range of motion 0-120.    ASSESSMENT:  Osteoarthritis involving her left knee.    PLAN:  Under sterile precautions, I injected the left knee using 80 mg Depo-Medrol, 6 cc 1% Xylocaine.  She tolerated the procedure well, was given post-injection instructions, and will return to the office on a p.r.n. basis.       Dictated By:  Frances Nickels, PA      ______________________________  Verita Schneiders, MD    LJS/MODL  DD:  05/23/2013 13:05:13  DT:  05/23/2013 14:54:47  Job #:  [id number]    cc: Verita Schneiders, MD   Gwenette Greet, MD   [address]   [place], [place] [address]

## 2013-05-23 NOTE — Progress Notes (Signed)
Patient seen at [facility], a dictated note will follow...

## 2013-07-24 ENCOUNTER — Encounter: Payer: Self-pay | Admitting: Gastroenterology

## 2013-07-24 LAB — HM MAMMOGRAPHY

## 2013-07-25 ENCOUNTER — Encounter: Payer: Self-pay | Admitting: Primary Care

## 2013-08-07 ENCOUNTER — Other Ambulatory Visit: Payer: Self-pay | Admitting: Primary Care

## 2013-08-07 MED ORDER — BUPROPION HCL 150 MG PO TB24 *I*
150.0000 mg | ORAL_TABLET | Freq: Every morning | ORAL | Status: DC
Start: 2013-08-07 — End: 2013-08-29

## 2013-08-29 ENCOUNTER — Ambulatory Visit: Payer: Self-pay | Admitting: Primary Care

## 2013-08-29 ENCOUNTER — Encounter: Payer: Self-pay | Admitting: Primary Care

## 2013-08-29 VITALS — BP 122/70 | HR 66 | Ht 64.96 in | Wt 210.0 lb

## 2013-08-29 DIAGNOSIS — F32A Depression, unspecified: Secondary | ICD-10-CM

## 2013-08-29 DIAGNOSIS — M1712 Unilateral primary osteoarthritis, left knee: Secondary | ICD-10-CM

## 2013-08-29 DIAGNOSIS — E669 Obesity, unspecified: Secondary | ICD-10-CM

## 2013-08-29 MED ORDER — BUPROPION HCL 150 MG PO TB24 *I*
150.0000 mg | ORAL_TABLET | Freq: Every morning | ORAL | Status: DC
Start: 2013-08-29 — End: 2014-01-10

## 2013-08-29 MED ORDER — ALPRAZOLAM 0.5 MG PO TABS *I*
ORAL_TABLET | ORAL | Status: DC
Start: 2013-08-29 — End: 2015-01-30

## 2013-08-29 NOTE — Progress Notes (Signed)
CC--f/u      HPI  Mood--doing well, 8/10, doing well, has not needed the lights,    Wt--down 5 pounds from last visit, exercises,     Left knee djd--exercise helps some and the steroid shots      Uri--has a cold, minimal cough just head congestion nothing more a little bit of ear discomfort on the right at times.      Review of systems      No fever  No headache  No chest pain  No shortness of breath  Minimal cough  No change of appetite  No nausea  No vomiting  No abdominal pain  No stool changes  No urinary symptoms          Current Outpatient Prescriptions   Medication Sig Dispense Refill    buPROPion (WELLBUTRIN XL) 150 MG 24 hr tablet Take 1 tablet (150 mg total) by mouth every morning   SWALLOW WHOLE, DO NOT BREAK,CRUSH OR CHEW  30 tablet  1    Nitrofurantoin Monohyd Macro (MACROBID PO) Take by mouth   Prn through gyn        Calcium 500 MG tablet Take 1 tablet by mouth 2 times daily        Cholecalciferol (VITAMIN D3) 1000 UNIT tablet Take 1,000 Units by mouth every morning           rizatriptan (MAXALT) 10 MG tablet Take 10 mg by mouth as needed   TAKE 1 TABLET AT ONSET OF HEADACHE. MAY REPEAT EVERY 2 HOURS AS NEEDED. MAXIMUM 3 TABLETS IN 24 HOURS.        Ibuprofen (ADVIL PO) Take 600 mg by mouth every 6 hours as needed            No current facility-administered medications for this visit.         Filed Vitals:    08/29/13 0726   BP: 122/70   Pulse: 66   Height: 1.65 m (5' 4.96")   Weight: 95.255 kg (210 lb)         EXAM  Gen: patient appears comfortable and in no distress  H. EENT:  Bilateral ear canals and TMs grossly normal neck supple   Cv: RRR, Nl S1,S2  Resp: CTA b/l, no rales, no rhonchi, no wheezing no tachypnea  Ext: no edema          A/P pleasant female seen here for followup of several issues.    Mood doing very well generally happy content continue the generic Wellbutrin long-term due to prior episodes of depression recommend followup in about 4 months for full physical.    Occasional  insomnia would like a prescription for alprazolam just for when necessary use last prescription was 2012 this was done.    Overweight lost another 5 pounds encouraged that she continue to stay aerobically active and lose weight.    Vitamin D deficiency continue vitamin D supplement.    It appears she had a DTaP vaccination 2012 we will look and confirm.    URI appears to just be a cold recommend just symptomatic treatment for now followup if does not improve or worsens

## 2013-10-11 ENCOUNTER — Encounter: Payer: Self-pay | Admitting: Primary Care

## 2013-10-31 ENCOUNTER — Encounter: Payer: Self-pay | Admitting: Orthopedic Surgery

## 2013-11-24 ENCOUNTER — Encounter: Payer: Self-pay | Admitting: Gastroenterology

## 2013-11-24 HISTORY — PX: COLONOSCOPY: SHX174

## 2013-11-24 LAB — HM COLONOSCOPY

## 2013-11-27 ENCOUNTER — Encounter: Payer: Self-pay | Admitting: Primary Care

## 2013-11-27 DIAGNOSIS — K5790 Diverticulosis of intestine, part unspecified, without perforation or abscess without bleeding: Secondary | ICD-10-CM

## 2013-11-27 HISTORY — DX: Diverticulosis of intestine, part unspecified, without perforation or abscess without bleeding: K57.90

## 2013-11-29 ENCOUNTER — Ambulatory Visit: Payer: Self-pay | Admitting: Sports Medicine

## 2013-11-29 ENCOUNTER — Encounter: Payer: Self-pay | Admitting: Sports Medicine

## 2013-11-29 VITALS — BP 134/61 | Ht 65.0 in | Wt 205.0 lb

## 2013-11-29 DIAGNOSIS — M179 Osteoarthritis of knee, unspecified: Secondary | ICD-10-CM

## 2013-11-29 DIAGNOSIS — M1710 Unilateral primary osteoarthritis, unspecified knee: Secondary | ICD-10-CM

## 2013-11-29 NOTE — Progress Notes (Signed)
PATIENTTERECIA, PLAUT MR #:  [id number]   ACCOUNT #:  [id number] DOB:  10-16-1955   DICTATED BY:  Minta Balsam, PA DATE OF VISIT:  11/29/2013     CHIEF COMPLAINT:  Followup for left knee.    INTERVAL HISTORY:  The patient was last seen on 05/23/2013 for her left knee.  She received a corticosteroid injection at that visit and it was quite helpful.  Since then, she has had an increase in swelling and occasional giving way along with some discomfort of her left knee.  She denies any new injury.    PHYSICAL EXAMINATION:  Exam of her left knee today reveals no warmth, redness, or breaks in the skin.  Very small effusion.  She is tender along primarily the medial joint line.  Range of motion 0-120.    ASSESSMENT:  Osteoarthritis, left knee.    PLAN:  Under sterile precautions, I injected the left knee using 80 mg Depo-Medrol, 6 cc 1% Xylocaine anterolaterally.  She tolerated the procedure well, was given post-injection instructions, and return to the office on a p.r.n. basis.       Dictated By:  Minta Balsam, PA      ______________________________  Haze Boyden, MD    LJS/MODL  DD:  11/29/2013 17:15:47  DT:  11/29/2013 18:00:04  Job #:  [id number]    cc: Haze Boyden, MD

## 2013-11-29 NOTE — Patient Instructions (Signed)
Injection Instructions    · The injection you received today may take 5-7 days to work.  · You may find the area that was injected to be more painful for 1 or 2 days after the injection.  This happens as the medicine is being absorbed in your body.  · It may be helpful to put ice on the body part that was injected to ease the pain.  · You may also use pain medication - Tylenol, Advil/Motrin or Aleve.  · Remember, it may take 5-7 days for you to feel the full results of the injection.  · If you are diabetic the medicine in the injection can increase your blood sugar level for several days.  Monitor your glucose levels closely.

## 2013-12-08 ENCOUNTER — Ambulatory Visit: Payer: Self-pay | Admitting: Orthopedic Surgery

## 2013-12-17 ENCOUNTER — Telehealth: Payer: Self-pay | Admitting: Primary Care

## 2013-12-17 DIAGNOSIS — M1710 Unilateral primary osteoarthritis, unspecified knee: Secondary | ICD-10-CM

## 2013-12-17 DIAGNOSIS — Z139 Encounter for screening, unspecified: Secondary | ICD-10-CM

## 2013-12-17 DIAGNOSIS — E669 Obesity, unspecified: Secondary | ICD-10-CM

## 2013-12-17 DIAGNOSIS — E559 Vitamin D deficiency, unspecified: Secondary | ICD-10-CM

## 2013-12-17 DIAGNOSIS — Z0000 Encounter for general adult medical examination without abnormal findings: Secondary | ICD-10-CM

## 2013-12-17 DIAGNOSIS — M179 Osteoarthritis of knee, unspecified: Secondary | ICD-10-CM

## 2013-12-17 DIAGNOSIS — G4730 Sleep apnea, unspecified: Secondary | ICD-10-CM

## 2013-12-17 DIAGNOSIS — F32A Depression, unspecified: Secondary | ICD-10-CM

## 2013-12-17 NOTE — Telephone Encounter (Signed)
Please notify pt that I have sent in to [facility] lab system his lab requisition for the fasting labs for upcoming physical

## 2013-12-21 ENCOUNTER — Telehealth: Payer: Self-pay | Admitting: Primary Care

## 2013-12-21 NOTE — Telephone Encounter (Signed)
[name] called stating she has had diarrhea since Friday. It is intermitant, if she eats or drinks she runs more.  She stated she is trying not to eat or drink until she gets home from work.  She has no fever, no abd pain just cramping prior to movement. She did stated she has some vague right low back pain.  Please advise

## 2013-12-21 NOTE — Telephone Encounter (Signed)
Sounds like she will end up needing an appointment perhaps tomorrow morning.  Reassuring if not having any fevers or chills.  Would recommend appointment at [phone] tomorrow morning recommend keep up with fluids Gatorade etc. bland diet

## 2013-12-22 ENCOUNTER — Ambulatory Visit: Payer: Self-pay | Admitting: Primary Care

## 2013-12-22 VITALS — BP 122/85 | HR 78 | Wt 200.0 lb

## 2013-12-22 DIAGNOSIS — R197 Diarrhea, unspecified: Secondary | ICD-10-CM

## 2013-12-22 NOTE — Progress Notes (Signed)
CCdiarrhea      HPI  Awoke 1 week ago and had loose stool once---soft not watery   And fine all day and then 5 days ago awoke in middle of night she had soiled self and went 4-5 times in the space of 4 hours--softer and at times watery, then 4 days ago  Had multiple soft bms every 15 min for a couple hours then fine 3 days ago then yesterday again awoke in middle of night awoke with gas and  Flaky stool,  Again today had multiple loose stools  5-6 times theis am---this is all atypical for her, no change in diet, no pain,no fever, no black or tarry bm's , mild constipation  for a few days before hand, had colonoscopy  Just 4 weeks ago,  slight bloating, mild right low back soreness, mostly with movement,, appetite okay but afraid, not keeping up with fluids--rec'd gatoraide    Mood wise she states she is doing fine.  No issues.  Continues to take the buproprion  Review of systems    Feeling  fatigued  No fever  No headache  No chest pain  No shortness of breath  No cough  No change of appetite  No nausea  No vomiting  No abdominal pain  stool changes  No urinary symptoms  No dizziness  mild lightheadedness          Current Outpatient Prescriptions   Medication Sig Dispense Refill    buPROPion (WELLBUTRIN XL) 150 MG 24 hr tablet Take 1 tablet (150 mg total) by mouth every morning   SWALLOW WHOLE, DO NOT BREAK,CRUSH OR CHEW  30 tablet  3    Nitrofurantoin Monohyd Macro (MACROBID PO) Take by mouth   Prn through gyn        Cholecalciferol (VITAMIN D3) 1000 UNIT tablet Take 1,000 Units by mouth every morning           rizatriptan (MAXALT) 10 MG tablet Take 10 mg by mouth as needed   TAKE 1 TABLET AT ONSET OF HEADACHE. MAY REPEAT EVERY 2 HOURS AS NEEDED. MAXIMUM 3 TABLETS IN 24 HOURS.        Ibuprofen (ADVIL PO) Take 600 mg by mouth every 6 hours as needed           ALPRAZolam (XANAX) 0.5 MG tablet Take 1/2  to 1 tablet every 8 hours as needed  30 tablet  0    Calcium 500 MG tablet Take 1 tablet by mouth 2 times  daily         No current facility-administered medications for this visit.         Filed Vitals:    12/22/13 0722   BP: 122/85   Pulse: 78   Weight: 90.719 kg (200 lb)         EXAM  Gen: patient appears comfortable and in no distress  Cv: RRR, Nl S1,S2  Resp: CTA b/l, no rales, no rhonchi  Ext: no edema  Abdomen:  Soft nontender nondistended positive bowel sounds no masses to palpation        A/P  Pleasant female with loose stools over the past week intermittent does feel a little fatigued and wiped out at times not keeping up with any volume/fluid loss encouraged her to drink Gatorade or propel to help replace electrolytes and fluid loss.  We will check stool for O&P bacteria C. diff and stool lactoferrin if all normal will recommend Kaopectate recommend bland diet for now had  a colonoscopy a month ago.  She will stay in touch over the next week.  She does not appear toxic et Ronney Asters.    Mood doing okay.  Thank you

## 2014-01-07 ENCOUNTER — Ambulatory Visit
Admit: 2014-01-07 | Discharge: 2014-01-07 | Disposition: A | Discharge status: Home / Self Care | Payer: Self-pay | Source: Ambulatory Visit | Attending: Primary Care | Admitting: Primary Care

## 2014-01-07 DIAGNOSIS — E559 Vitamin D deficiency, unspecified: Secondary | ICD-10-CM

## 2014-01-07 DIAGNOSIS — M1710 Unilateral primary osteoarthritis, unspecified knee: Secondary | ICD-10-CM

## 2014-01-07 DIAGNOSIS — E669 Obesity, unspecified: Secondary | ICD-10-CM

## 2014-01-07 DIAGNOSIS — Z0000 Encounter for general adult medical examination without abnormal findings: Secondary | ICD-10-CM

## 2014-01-07 DIAGNOSIS — Z139 Encounter for screening, unspecified: Secondary | ICD-10-CM

## 2014-01-07 DIAGNOSIS — M179 Osteoarthritis of knee, unspecified: Secondary | ICD-10-CM

## 2014-01-07 DIAGNOSIS — F32A Depression, unspecified: Secondary | ICD-10-CM

## 2014-01-07 DIAGNOSIS — G4730 Sleep apnea, unspecified: Secondary | ICD-10-CM

## 2014-01-07 LAB — URINALYSIS WITH MICROSCOPIC
Blood,UA: NEGATIVE
Ketones, UA: NEGATIVE
Leuk Esterase,UA: NEGATIVE
Nitrite,UA: NEGATIVE
Protein,UA: NEGATIVE mg/dL
RBC,UA: 1 /hpf (ref 0–2)
Specific Gravity,UA: 1.019 (ref 1.002–1.030)
WBC,UA: NONE SEEN /hpf (ref 0–5)
pH,UA: 6 (ref 5.0–8.0)

## 2014-01-07 LAB — CBC AND DIFFERENTIAL
Baso # K/uL: 0 10*3/uL (ref 0.0–0.1)
Basophil %: 0.2 % (ref 0.1–1.2)
Eos # K/uL: 0.1 10*3/uL (ref 0.0–0.4)
Eosinophil %: 1.9 % (ref 0.7–5.8)
Hematocrit: 40 % (ref 34–45)
Hemoglobin: 12.6 g/dL (ref 11.2–15.7)
Lymph # K/uL: 1.4 10*3/uL (ref 1.2–3.7)
Lymphocyte %: 27 % (ref 19.3–51.7)
MCH: 27 pg/cell (ref 26–32)
MCHC: 31 g/dL — ABNORMAL LOW (ref 32–36)
MCV: 88 fL (ref 79–95)
Mono # K/uL: 0.4 10*3/uL (ref 0.2–0.9)
Monocyte %: 8.4 % (ref 4.7–12.5)
Neut # K/uL: 3.3 10*3/uL (ref 1.6–6.1)
Platelets: 195 10*3/uL (ref 160–370)
RBC: 4.6 MIL/uL (ref 3.9–5.2)
RDW: 16 % — ABNORMAL HIGH (ref 11.7–14.4)
Seg Neut %: 62.3 % (ref 34.0–71.1)
WBC: 5.2 10*3/uL (ref 4.0–10.0)

## 2014-01-07 LAB — LIPID PANEL
Chol/HDL Ratio: 2.6
Cholesterol: 217 mg/dL — AB
HDL: 82 mg/dL
LDL Calculated: 127 mg/dL
Non HDL Cholesterol: 135 mg/dL
Triglycerides: 39 mg/dL

## 2014-01-07 LAB — BASIC METABOLIC PANEL
Anion Gap: 12 (ref 7–16)
CO2: 27 mmol/L (ref 20–28)
Calcium: 8.8 mg/dL (ref 8.6–10.2)
Chloride: 103 mmol/L (ref 96–108)
Creatinine: 0.76 mg/dL (ref 0.51–0.95)
GFR,Black: 101 *
GFR,Caucasian: 88 *
Glucose: 90 mg/dL (ref 60–99)
Lab: 11 mg/dL (ref 6–20)
Potassium: 4.7 mmol/L (ref 3.3–5.1)
Sodium: 142 mmol/L (ref 133–145)

## 2014-01-07 LAB — HEPATITIS C ANTIBODY: Hep C Ab: NEGATIVE

## 2014-01-07 LAB — AST: AST: 19 U/L (ref 0–35)

## 2014-01-07 LAB — ALT: ALT: 15 U/L (ref 0–35)

## 2014-01-09 LAB — VITAMIN D
25-OH VIT D2: <4 ng/mL
25-OH VIT D3: 32 ng/mL
25-OH Vit Total: 32 ng/mL (ref 30–60)

## 2014-01-10 ENCOUNTER — Other Ambulatory Visit: Payer: Self-pay | Admitting: Primary Care

## 2014-01-12 NOTE — Telephone Encounter (Signed)
Delvecchio, Yvone Neu 01/11/2014 12:56 PM EDT    Hello,  Just a quick reminder when you are sending messages to patients in MyChart to check the box "Allow reply directly to me" so any replies get to your inbasket in a timely fashion. If this box is not checked it goes to the MyChart admin error pool before it is routed back to you.  Thank you,  MyChart Customer Support - [phone]   ----- Message -----   From: [name]   Sent: 01/11/2014 12:48 PM   To: Mychart Admin  Subject: JH:ERDEYCX labs     Hi Sarajane Marek and I have physicals scheduled next week.  We would like to complete our biometric screening for [facility].  Do you have access to the form or would you prefer if we brought a hard copy to our appointments.  Thanks,  Malachy Mood  ----- Message -----  From: Juluis Pitch, LPN   Sent: 10/01/8183 2:50 PM EDT   To: [name]  Subject: fasting labs     Julieta    Please have fasting labs done prior to upcoming physical. Your orders are available at any of the [facility].    Thanks  Thayden Lemire

## 2014-01-18 ENCOUNTER — Ambulatory Visit: Payer: Self-pay | Admitting: Primary Care

## 2014-01-18 ENCOUNTER — Encounter: Payer: Self-pay | Admitting: Primary Care

## 2014-01-18 VITALS — BP 125/82 | HR 68 | Ht 65.0 in | Wt 197.1 lb

## 2014-01-18 DIAGNOSIS — L659 Nonscarring hair loss, unspecified: Secondary | ICD-10-CM

## 2014-01-18 DIAGNOSIS — G43909 Migraine, unspecified, not intractable, without status migrainosus: Secondary | ICD-10-CM

## 2014-01-18 DIAGNOSIS — E669 Obesity, unspecified: Secondary | ICD-10-CM

## 2014-01-18 DIAGNOSIS — F32A Depression, unspecified: Secondary | ICD-10-CM

## 2014-01-18 DIAGNOSIS — Z139 Encounter for screening, unspecified: Secondary | ICD-10-CM

## 2014-01-18 DIAGNOSIS — Z0000 Encounter for general adult medical examination without abnormal findings: Secondary | ICD-10-CM

## 2014-01-18 DIAGNOSIS — L719 Rosacea, unspecified: Secondary | ICD-10-CM

## 2014-01-18 DIAGNOSIS — M1712 Unilateral primary osteoarthritis, left knee: Secondary | ICD-10-CM

## 2014-01-18 DIAGNOSIS — E559 Vitamin D deficiency, unspecified: Secondary | ICD-10-CM

## 2014-01-18 DIAGNOSIS — I059 Rheumatic mitral valve disease, unspecified: Secondary | ICD-10-CM

## 2014-01-18 DIAGNOSIS — G4730 Sleep apnea, unspecified: Secondary | ICD-10-CM

## 2014-01-18 NOTE — Progress Notes (Signed)
 Chief complaint:Pleasant female seen here for physical as well as review of multiple issues we reviewed together family history past medical history medications allergies and immunizations.  Recent lab work reviewed together.         HPI:  Pleasant female seen here for physical without any or concerns.    Decreased mood overall doing fine with current management mood is an 8-10 not suicidal etc.    Overweight has done well with weight loss is exercising on a daily basis for 35-40 minutes has clearly lost weight over the last couple years.      Left knee osteoarthritis sees orthopedics regularly does occasionally get steroid shots.    Migraine headaches intermittent and chronic Maxalt usually works does take occasional ibuprofen as well    History of mild sleep apnea does not require CPAP per patient.  Has lost weight since she was even studied in the past and is rested in the morning.    Vitamin D deficiency level good she will continue supplement    Current Outpatient Prescriptions   Medication Sig Dispense Refill   . buPROPion (WELLBUTRIN XL) 150 MG 24 hr tablet TAKE 1 TABLET BY MOUTH EVERY MORNING . SWALLOW WHOLE, DO NOT BREAK,CRUSH OR CHEW 30 tablet 3   . ALPRAZolam (XANAX) 0.5 MG tablet Take 1/2  to 1 tablet every 8 hours as needed 30 tablet 0   . Nitrofurantoin Monohyd Macro (MACROBID PO) Take by mouth   Prn through gyn     . Calcium 500 MG tablet Take 1 tablet by mouth 2 times daily     . Cholecalciferol (VITAMIN D3) 1000 UNIT tablet Take 1,000 Units by mouth every morning        . rizatriptan (MAXALT) 10 MG tablet Take 10 mg by mouth as needed   TAKE 1 TABLET AT ONSET OF HEADACHE. MAY REPEAT EVERY 2 HOURS AS NEEDED. MAXIMUM 3 TABLETS IN 24 HOURS.     Marland Kitchen Ibuprofen (ADVIL PO) Take 600 mg by mouth every 6 hours as needed          No current facility-administered medications for this visit.        Patient Active Problem List    Diagnosis Date Noted   . Tear of medial cartilage or meniscus of knee, current  12/03/2010   . Left knee DJD 11/07/2010   . Depression 11/07/2010   . Vitamin D Deficiency 07/23/2010   . Sleep Apnea 02/22/2007     [facility] Annotation: Mar 15 2007  9:05PM - [place]: dr [place]  cpap       . Overweight 04/05/2003            . Migraine Headache 04/05/2003            . Rosacea 04/05/2003            . 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 Migraine      q2-3 months   . Syncope      d/t migraines   . Urinary tract infection      frequent  3 this year   . Internal hemorrhoids    . Benign neoplasm of colon    . 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 Complication of anesthesia      PONV   . Vertigo    .  Calcaneal bursitis 01/14/2012     right   . Sleep apnea      does not use CPAP   . Mitral valve prolapse    . OA (osteoarthritis)      left knee   . Depression    . Anxiety 3/12   . Chronic cholecystitis 10/2012   . Diverticulosis 11/27/2013     mild   . Abdominal pain 07/19/2012   . Osteoarthritis, knee 12/03/2010        Past Surgical History   Procedure Laterality Date   . Strabismus correction Right 1972, 1993     x2--child + 81 yo   . Cesarean section, classic  '86 '88     spinal   . Colonoscopy  2012   . Wisdom tooth extraction  1978     4 wisdom and B/L bicuspids   . Knee arthroscopy Left 06/19/11   . Cholecystectomy, laparoscopic  11/16/2012   . Colonoscopy  11/24/2013     10/2018        Family History   Problem Relation Age of Onset   . Breast cancer Mother    . Stroke Father    . Heart failure Father    . Asthma Sister    . Breast cancer Mother    . Ovarian cancer Maternal Grandmother    . Colon cancer Maternal Grandfather    . Depression Mother    . Depression Sister    . Depression Brother    . Depression Daughter    . Heart disease Father    . Stroke Paternal Grandmother    . Stroke Paternal Grandfather    . Substance abuse Sister      Drug - past   . Substance abuse Brother      Alcohol - current       Review of  systems:    Generally feeling:  Well  Feeling tired:  No  Fever:  No  Chills:  No  Recent weight change:  No  Headache:  Stable migraines  Vision problems:  No  Hearing loss:  No  Earache:  No  Nasal symptoms:  No  Sore throat:  No  Chest pain:  No  Palpitations:  Only a rare skip or 2  Leg pain with exercise (claudication):  No  Shortness of breath:  No  Cough:  No   wheezing:  No  Change in appetite:  No  Heartburn:  No  Nausea:  No  Vomiting:  No  Abdominal pain:  No  Change in the stool:  No  Diarrhea: resolved  Constipation:  No  Blood in  urine:  No  Increased urinary frequency:  No  Pain during urination:  No  Excessive thirst or fluid intake:  No  Excessive sweating:  No  Easy bleeding:  No  Easy bruising tendency:  No  Back pain:  No  Generalized muscle aches:  No  Localized joint pain:  Left knee djd--stable  Dizziness:  occs vertigo  Lightheadedness:  No  Fainting:  Can occur with migraines for yrs  Memory loss:  No  Anxiety:  No  Depression:  Doing okay  Itching:  No  Skin rash:  No  Skin lesion:  No    Social history extended    Caffeine use:3 cups a day  Tobacco UJW:JXBJY  Alcohol use:1 a week  Drug NWG:NFAO  Sleep habits:good except with stress--she will try a full xanax at bedtime  Sunscreen ZHY:QMVH  Ophthalmology:good  Dental hygiene:good  Dentist:good  Exercise habits:bike every day 35-53min daily  Work history:[employer]  Marital history: married      Filed Vitals:    01/18/14 1002   BP: 125/82   Pulse: 68   Height: 1.651 m (5\' 5" )   Weight: 89.404 kg (197 lb 1.6 oz)     Body mass index is 32.8 kg/(m^2).    Physical exam:    General appearance:   Mild overweight habitus.  Well-developed, well groomed.  Appears stated age.  No acute distress.  Color good.  Mental status:  Appears alert and oriented.  Affect appropriate.  Skin:  Skin color and turgor normal.  No suspicious lesions, masses, rashes, or ulcerations.  Nails  normal with moderate thinning of hair on scalp  Head:  Normocephalic  Ears:   External ears without scars, masses, or lesions.  External auditory canal intact, clear, and without lesions.  TMs intact with normal light reflex and landmarks.  Acuity to conversational tones good.  Eyes: PERRL, extraocular movements intact.  Lids without defect, conjunctiva and sclera appear normal.  Fundi grossly normal.  Nose:  Nasal mucosa and turbinates pink, septum midline, no lesions.  Mouth:  Teeth in good repair.  Gums pink without lesions.  Normal appearing mucosa, palate, and tongue.  Oropharynx:  Moist without exudate, erythema, or swelling.  Neck:  Symmetric, trachea midline.  Thyroid nontender without enlargement or masses.  Carotids with no bruits bilaterally.  No cervical lymphadenopathy.  Breasts:  Breasts appear normal and symmetric without palpable masses, skin changes, or nipple inversion.  No discharge, rash, or skin retraction.  No axillary or supraclavicular lymphadenopathy.  Chest:  Respirations unlabored.  Chest was symmetric with no masses.  Breath sounds clear bilaterally without wheezes, rubs, rales, or rhonchi.  CV:  Normal S1 and S2 without murmur, rub, gallop, or click.  No edema, clubbing, or cyanosis.  No varicosities.  Dorsalis pedis pulses full and symmetrical.  Abdomen:  Abdomen soft with normal bowel sounds.  No guarding or rebound.  No palpable masses or tenderness.  Liver without tenderness or enlargement.  No aortic widening.  GU and rectal deferred to GYN   Musculoskeletal:  Muscle tone and strength normal for age, without atrophy or abnormal movements.  Extremities:  Joints with full range of motion, without tenderness, crepitus, or contracture.  No obvious joint deformities or effusions.  Neuro:  Cranial nerves II through XII grossly intact.  Motor strength symmetrical without obvious weakness.  Superficial sensation intact bilaterally to light touch.  Observed dexterity without ataxia or tremor.  Deep tendon reflexes 2+ and symmetric bilaterally.         Assessment and  plan:         Pleasant female seen here for physical and review of several issues.      Decreased mood doing well at this time.  She will continue her present medications.  Her mood is an 8/10.  Not suicidal..    Overwe

## 2014-01-19 ENCOUNTER — Encounter: Payer: Self-pay | Admitting: Primary Care

## 2014-03-08 ENCOUNTER — Ambulatory Visit
Admit: 2014-03-08 | Discharge: 2014-03-08 | Disposition: A | Discharge status: Home / Self Care | Payer: Self-pay | Source: Ambulatory Visit | Attending: Dermatology | Admitting: Dermatology

## 2014-03-08 LAB — TIBC
Iron: 31 ug/dL — ABNORMAL LOW (ref 34–165)
TIBC: 325 ug/dL (ref 250–450)
Transferrin Saturation: 10 % — ABNORMAL LOW (ref 15–50)

## 2014-03-08 LAB — T4, FREE: Free T4: 1.2 ng/dL (ref 0.9–1.7)

## 2014-03-08 LAB — TSH: TSH: 2.18 u[IU]/mL (ref 0.27–4.20)

## 2014-03-08 LAB — FERRITIN: Ferritin: 116 ng/mL (ref 10–120)

## 2014-03-15 ENCOUNTER — Encounter: Payer: Self-pay | Admitting: Primary Care

## 2014-03-15 MED ORDER — ESCITALOPRAM OXALATE 10 MG PO TABS *I*
10.0000 mg | ORAL_TABLET | Freq: Every day | ORAL | Status: DC
Start: 2014-03-15 — End: 2014-07-18

## 2014-05-03 ENCOUNTER — Encounter: Payer: Self-pay | Admitting: Primary Care

## 2014-05-07 ENCOUNTER — Other Ambulatory Visit: Payer: Self-pay | Admitting: Primary Care

## 2014-05-07 ENCOUNTER — Telehealth: Payer: Self-pay | Admitting: Primary Care

## 2014-05-07 NOTE — Telephone Encounter (Signed)
Bupropion rx cancelled

## 2014-05-07 NOTE — Telephone Encounter (Signed)
Spoke with the pharmacist:   Cancelled script.

## 2014-05-07 NOTE — Telephone Encounter (Signed)
See note

## 2014-07-14 ENCOUNTER — Telehealth: Payer: Self-pay | Admitting: Primary Care

## 2014-07-14 ENCOUNTER — Other Ambulatory Visit: Payer: Self-pay | Admitting: Primary Care

## 2014-07-14 NOTE — Telephone Encounter (Signed)
Pt due for brief mood f/u

## 2014-07-16 ENCOUNTER — Ambulatory Visit: Payer: Self-pay | Admitting: Orthopedic Surgery

## 2014-07-16 ENCOUNTER — Encounter: Payer: Self-pay | Admitting: Orthopedic Surgery

## 2014-07-16 VITALS — BP 136/76 | Ht 65.0 in | Wt 204.0 lb

## 2014-07-16 DIAGNOSIS — M1712 Unilateral primary osteoarthritis, left knee: Secondary | ICD-10-CM

## 2014-07-16 NOTE — Telephone Encounter (Signed)
Scheduled Friday, January 22 at 9:00.

## 2014-07-16 NOTE — Progress Notes (Signed)
PATIENTELVENIA, [name] MR #:  [id number]   ACCOUNT #:  [id number] DOB:  08-02-1955   DICTATED BY:  [name], [name] DATE OF VISIT:  07/16/2014     CHIEF COMPLAINT:  Followup for left knee.    INTERVAL HISTORY:  The patient was last seen on 11/29/2013 for a corticosteroid injection and has done well since.  Her symptoms did return a month or so ago, but she is trying to space the injections out.  She says this currently awakes her from sleep.  She has had sharp pain with walking and some swelling as well but no giving way.  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  She was accompanied to the visit today by her husband.    PHYSICAL EXAMINATION:  Exam of the left knee reveals no warmth, redness, or breaks in the skin.  She is tender along the medial joint line and has patellofemoral crepitation and irritability as well.  Range of motion 0-120.  No appreciable ligamentous instability.  Small effusion.    PLAN:  Under sterile precautions, I injected the left knee using 80 mg Depo-Medrol and 6 cc 1% Xylocaine.  She tolerated the procedure well, was given post-injection instructions, and will return to the office on a p.r.n. basis.       Dictated By:  [name], [name]      ______________________________  [name], [name]    LJS/MODL  DD:  07/16/2014 17:32:45  DT:  07/16/2014 18:00:59  Job #:  [id number]    cc: [name], [name]

## 2014-07-18 ENCOUNTER — Encounter: Payer: Self-pay | Admitting: Primary Care

## 2014-07-18 ENCOUNTER — Ambulatory Visit: Payer: Self-pay | Admitting: Primary Care

## 2014-07-18 VITALS — BP 136/80 | HR 56 | Ht 65.0 in | Wt 209.5 lb

## 2014-07-18 DIAGNOSIS — F32A Depression, unspecified: Secondary | ICD-10-CM

## 2014-07-18 MED ORDER — BUPROPION HCL 150 MG PO TB24 *I*
150.0000 mg | ORAL_TABLET | Freq: Every morning | ORAL | Status: DC
Start: 2014-07-18 — End: 2014-12-17

## 2014-07-18 NOTE — Progress Notes (Signed)
CCf/u      HPI    Mood--doing good esp since the hours working and the middle of winter,stopped the lexapro due to fatigue and went back to bupropion, 8/10 --feels is normal self. Daughter is doing well overall. Sleep is fair, she does not mind the current thin hair and is using rogain and helps    Migraines--stable and not bad,    Left knee-djd--recent steroid shot,    Review of systems    Feeling fine  No fever  No headache  No chest pain  No shortness of breath  No cough  No change of appetite  No nausea  No vomiting  No abdominal pain  No stool changes  No urinary symptoms  No dizziness  No lightheadedness  No depression  No skin rashes        Current Outpatient Prescriptions   Medication Sig Dispense Refill    buPROPion (WELLBUTRIN XL) 150 MG 24 hr tablet TAKE 1 TABLET BY MOUTH EVERY MORNING SWALLOW WHOLE, DO NOT BREAK,CRUSH OR CHEW 30 tablet 0    ALPRAZolam (XANAX) 0.5 MG tablet Take 1/2  to 1 tablet every 8 hours as needed 30 tablet 0    Nitrofurantoin Monohyd Macro (MACROBID PO) Take by mouth   Prn through gyn      Calcium 500 MG tablet Take 1 tablet by mouth 2 times daily      Cholecalciferol (VITAMIN D3) 1000 UNIT tablet Take 1,000 Units by mouth every morning         rizatriptan (MAXALT) 10 MG tablet Take 10 mg by mouth as needed   TAKE 1 TABLET AT ONSET OF HEADACHE. MAY REPEAT EVERY 2 HOURS AS NEEDED. MAXIMUM 3 TABLETS IN 24 HOURS.      Ibuprofen (ADVIL PO) Take 600 mg by mouth every 6 hours as needed          No current facility-administered medications for this visit.         Filed Vitals:    07/18/14 1304   BP: 143/85   Pulse: 56   Height: 1.651 m (5\' 5" )   Weight: 95.029 kg (209 lb 8 oz)         EXAM  Gen: patient appears comfortable and in no distress  Cv: RRR, Nl S1,S2  Resp: CTA b/l, no rales, no rhonchi  Ext: no edema          A/P pleasant female seen for followup of her mood.      Mood generally doing well especially for this time year she prefers to stay in the buproprion she never did  see the specialist as recommended per patient.  She is happy content not suicidal repeat blood pressure okay.  She'll followup with me in about 4 months or so sooner if needed.  Sincerely Maudry Mayhew. Daleah Coulson

## 2014-07-20 ENCOUNTER — Ambulatory Visit: Payer: Self-pay | Admitting: Primary Care

## 2014-07-30 ENCOUNTER — Encounter: Payer: Self-pay | Admitting: Gastroenterology

## 2014-07-30 LAB — HM MAMMOGRAPHY

## 2014-07-31 ENCOUNTER — Encounter: Payer: Self-pay | Admitting: Primary Care

## 2014-12-17 ENCOUNTER — Other Ambulatory Visit: Payer: Self-pay | Admitting: Primary Care

## 2014-12-17 ENCOUNTER — Telehealth: Payer: Self-pay | Admitting: Primary Care

## 2014-12-17 NOTE — Telephone Encounter (Signed)
Pt due for brief mood f/u

## 2014-12-19 NOTE — Telephone Encounter (Signed)
Spoke with [name], will wait for her physical August 3.

## 2015-01-05 ENCOUNTER — Telehealth: Payer: Self-pay | Admitting: Primary Care

## 2015-01-05 DIAGNOSIS — E559 Vitamin D deficiency, unspecified: Secondary | ICD-10-CM

## 2015-01-05 DIAGNOSIS — Z139 Encounter for screening, unspecified: Secondary | ICD-10-CM

## 2015-01-05 DIAGNOSIS — E669 Obesity, unspecified: Secondary | ICD-10-CM

## 2015-01-05 NOTE — Telephone Encounter (Signed)
She has a physical scheduled in august but had one last yr--is it covered etc???

## 2015-01-07 ENCOUNTER — Encounter: Payer: Self-pay | Admitting: Primary Care

## 2015-01-07 NOTE — Telephone Encounter (Signed)
Spoke with [name], she will check with insurance, if not, she at least wants extended visit for biometric screening etc.  She will let me know.

## 2015-01-07 NOTE — Telephone Encounter (Signed)
She is covered, she checked with her insurance;  Allowed one every calendar year.

## 2015-01-15 ENCOUNTER — Encounter: Payer: Self-pay | Admitting: Orthopedic Surgery

## 2015-01-21 ENCOUNTER — Ambulatory Visit: Payer: Self-pay | Admitting: Primary Care

## 2015-01-23 ENCOUNTER — Ambulatory Visit
Admit: 2015-01-23 | Discharge: 2015-01-23 | Disposition: A | Discharge status: Home / Self Care | Payer: Self-pay | Source: Ambulatory Visit | Attending: Primary Care | Admitting: Primary Care

## 2015-01-23 DIAGNOSIS — E669 Obesity, unspecified: Secondary | ICD-10-CM

## 2015-01-23 DIAGNOSIS — Z139 Encounter for screening, unspecified: Secondary | ICD-10-CM

## 2015-01-23 DIAGNOSIS — E559 Vitamin D deficiency, unspecified: Secondary | ICD-10-CM

## 2015-01-23 LAB — BASIC METABOLIC PANEL
Anion Gap: 14 (ref 7–16)
CO2: 26 mmol/L (ref 20–28)
Calcium: 8.9 mg/dL (ref 8.6–10.2)
Chloride: 103 mmol/L (ref 96–108)
Creatinine: 0.71 mg/dL (ref 0.51–0.95)
GFR,Black: 109 *
GFR,Caucasian: 95 *
Glucose: 94 mg/dL (ref 60–99)
Lab: 14 mg/dL (ref 6–20)
Potassium: 4.9 mmol/L (ref 3.3–5.1)
Sodium: 143 mmol/L (ref 133–145)

## 2015-01-23 LAB — LIPID PANEL
Chol/HDL Ratio: 2.6
Cholesterol: 208 mg/dL — AB
HDL: 80 mg/dL
LDL Calculated: 118 mg/dL
Non HDL Cholesterol: 128 mg/dL
Triglycerides: 48 mg/dL

## 2015-01-28 ENCOUNTER — Encounter: Payer: Self-pay | Admitting: Orthopedic Surgery

## 2015-01-28 ENCOUNTER — Ambulatory Visit: Payer: Self-pay | Admitting: Orthopedic Surgery

## 2015-01-28 VITALS — BP 145/68 | HR 96 | Ht 65.0 in | Wt 208.0 lb

## 2015-01-28 DIAGNOSIS — M1712 Unilateral primary osteoarthritis, left knee: Secondary | ICD-10-CM

## 2015-01-28 NOTE — Progress Notes (Signed)
[name], IGLESIAS MR #:  [id number]   ACCOUNT #:  [id number] DOB:  27-Jun-1956   DICTATED BY:  Minta Balsam, PA DATE OF VISIT:  01/28/2015     CHIEF COMPLAINT:  Followup for left knee.    INTERVAL HISTORY:  The patient was last seen on 07/16/2014 and received a corticosteroid injection for the left knee. She reports that it did help.  She has also continued to lose weight.  She has now lost over 40 pounds since she started working on it.  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     Exam of her left knee today reveals tenderness along the medial joint line, patellofemoral crepitation, and irritability.  Range of motion 0-120.    ASSESSMENT:  Left knee osteoarthritis.    PLAN:  Under sterile precautions, I injected the left knee using 80 mg Depo-Medrol and 6 cc 1% Xylocaine.  She tolerated the procedure well, was given postinjection instructions.       She then brought to my attention the fact that she is having significant pain in her foot.  She had seen a podiatrist 3 years ago for a similar situation and orthotics were helpful at that time.  She reports that she has a high arch.  She has not sustained any apparent injury, although she has done a lot of walking and shopping with her daughter in preparation for an upcoming wedding.  She indicates that the pain is on the medial side of the calcaneus, but not on the plantar surface.  She does have onset pain however.  She says that the pain is so severe that she nearly went to the ED this weekend.  Accordingly, I have asked her to schedule an appointment when she checks out today to be seen urgently by Dr. Althea Charon, Dr. Criselda Peaches, or 1 of the mid levels.  She will get that scheduled, and will see her  back in the office for her knee in about 3 months.       Dictated By:  Minta Balsam, PA      ______________________________  Haze Boyden, MD    LJS/MODL  DD:  01/28/2015 17:20:45  DT:  01/28/2015 19:38:25  Job #:  [id number]    cc:

## 2015-01-29 ENCOUNTER — Encounter: Payer: Self-pay | Admitting: Orthopedic Surgery

## 2015-01-30 ENCOUNTER — Encounter: Payer: Self-pay | Admitting: Primary Care

## 2015-01-30 ENCOUNTER — Ambulatory Visit: Payer: Self-pay | Admitting: Primary Care

## 2015-01-30 VITALS — BP 138/80 | HR 60 | Ht 65.25 in | Wt 207.0 lb

## 2015-01-30 DIAGNOSIS — G4730 Sleep apnea, unspecified: Secondary | ICD-10-CM

## 2015-01-30 DIAGNOSIS — E559 Vitamin D deficiency, unspecified: Secondary | ICD-10-CM

## 2015-01-30 DIAGNOSIS — E669 Obesity, unspecified: Secondary | ICD-10-CM

## 2015-01-30 DIAGNOSIS — F32A Depression, unspecified: Secondary | ICD-10-CM

## 2015-01-30 DIAGNOSIS — G43909 Migraine, unspecified, not intractable, without status migrainosus: Secondary | ICD-10-CM

## 2015-01-30 DIAGNOSIS — M1712 Unilateral primary osteoarthritis, left knee: Secondary | ICD-10-CM

## 2015-01-30 NOTE — Progress Notes (Addendum)
CCf/u      HPI--labs reviewed    Left medial heel pain for several weeks--no trauma--seeing ortho in 2 days--taking advil--- has improved after she rested and Weight off of it for 3 days etiology for the pain is unclear    Mood--good , happy and content, not suicidal taking her medication regularly    Vertigo--comes and goes--not a new issue, quick head turn will ppt it--again offered PT    Sleep apnea was always mild, no cpap    Left knee djd--recently had repeat steroid shot    Migraines--stable--maxalt helps    Immunizations, mammogram and dexa up to date      Review of systems    Feeling fine  No fever  No headache  No chest pain  No shortness of breath  No cough  No change of appetite  No nausea  No vomiting  No abdominal pain  No stool changes  No urinary symptoms  Occasional stable intermittent vertigo  No lightheadedness  No skin rashes        Current Outpatient Prescriptions   Medication Sig Dispense Refill    buPROPion (WELLBUTRIN XL) 150 MG 24 hr tablet TAKE 1 TABLET BY MOUTH EVERY MORNING SWALLOW WHOLE, DO NOT BREAK,CRUSH OR CHEW 30 tablet 1    Nitrofurantoin Monohyd Macro (MACROBID PO) Take by mouth   Prn through gyn      Calcium 500 MG tablet Take 1 tablet by mouth 2 times daily      Cholecalciferol (VITAMIN D3) 1000 UNIT tablet Take 1,000 Units by mouth every morning         rizatriptan (MAXALT) 10 MG tablet Take 10 mg by mouth as needed   TAKE 1 TABLET AT ONSET OF HEADACHE. MAY REPEAT EVERY 2 HOURS AS NEEDED. MAXIMUM 3 TABLETS IN 24 HOURS.      Ibuprofen (ADVIL PO) Take 600 mg by mouth every 6 hours as needed          No current facility-administered medications for this visit.          Vitals:    01/30/15 0804   BP: 138/80   Pulse: 60   Weight: 93.9 kg (207 lb)   Height: 1.657 m (5' 5.25")         EXAM  Gen: patient appears comfortable and in no distress  Cv: RRR, Nl S1,S2  Resp: CTA b/l, no rales, no rhonchi   abdomen: Soft nontender nondistended no mass palpation  Ext: no edema, Left medial  heel with palpable tenderness to palpation no swelling erythema or warmth present no bony abnormality present          A/PPleasant female seen here for extended visit -- overall doing well    Migraine headaches continue when necessary Maxalt     Left foot pain seeing orthopedics in a few days continue Advil when necessary with food    Overweight encouraged further weight loss with exercise decrease calorie intake shouldn't help improve the knee pain as well    Vitamin D deficiency continue supplement    Left knee arthritis continue when necessary steroid shots    Depression doing well continue bupropion.

## 2015-02-01 ENCOUNTER — Ambulatory Visit: Payer: Self-pay | Admitting: Orthopedic Surgery

## 2015-02-01 ENCOUNTER — Encounter: Payer: Self-pay | Admitting: Orthopedic Surgery

## 2015-02-01 VITALS — BP 134/69 | HR 75 | Ht 65.0 in | Wt 207.0 lb

## 2015-02-01 DIAGNOSIS — M79672 Pain in left foot: Secondary | ICD-10-CM

## 2015-02-01 DIAGNOSIS — M722 Plantar fascial fibromatosis: Secondary | ICD-10-CM

## 2015-02-01 NOTE — Progress Notes (Signed)
59 year old female presents with a chief complaint left heel pain central aspect severe over the last week.  Worst pain with first few steps in the morning.  Gradually improves then worsens by the end of the day.  Milder symptoms started 3-4 weeks ago.  No treatment to date    Patient's past medical history, medications, allergies, surgical history, family history, problem list and social history have all been reviewed and confirmed in the electronic record.    The following systems were reviewed:  Gastrointestinal: Frequent indigestion, blood in stools, heartburn, colitis, ulcer, nausea  Urinary: Kidney stones, difficult urination, frequent urination, burning, painful  Neurologic: Paralysis, tingling in arms or legs, weakness, seizures, numbness, tremor  Integumentary: Frequent rashes, boils, infection, itching, delayed wound healing  Vascular: Vein problems, phlebitis, blood clots, calf pain, bleeding difficulty, easy bruising  Cardiac: Chest pain, irregular heartbeat, heart murmur, shortness of breath  Pulmonary: Shortness of breath, wheezing, chronic cough, difficulty breathing lying flat  Endocrine: Excessive sweating, excessive thirst, masses or swelling  Constitutional: Fevers, chills, night sweats, unintentional weight loss or gain, appetite loss  Musculoskeletal: Multiple joint aches or swelling, fibromyalgia, joint instability  Hematologic: Easy bruising, swelling or masses  Psychiatric: Depression, anxiety, bipolar disorder, schizophrenia  Positives noted include:Negative    Examination:   Patient is awake alert and fully oriented.  Patient is pleasant and cooperative.  Patient is well-developed and well-nourished.  Patient is in no apparent distress.  Vital signs reviewed in electronic record.Left foot and ankle examination reveals mildly cavus foot plantar medial heel tenderness negative calcaneal squeeze test mild gastrocnemius tightness no instability palpable pulses normal sensation light touch and  normal motor function.The patient's contralateral foot and ankle has full range of motion to all major joints, normal stability to varus stressing and anterior draw testing, palpable pulses, grossly normal sensation to light touch, and normal motor function for all major muscle groups.  Imaging studies were personally reviewed show mildly cavus foot no evidence of a stress fracture    Impression: Planter fasciitis    Plan: Discussed the natural history multiple treatment options available went over stretching use of inserts and night splints could consider injection if not improved discussed in detail with patient follow-up when necessary    Cathren Harsh., MD

## 2015-02-12 ENCOUNTER — Ambulatory Visit: Payer: Self-pay | Admitting: Orthopedic Surgery

## 2015-02-13 ENCOUNTER — Other Ambulatory Visit: Payer: Self-pay | Admitting: Primary Care

## 2015-05-03 ENCOUNTER — Ambulatory Visit: Payer: Self-pay | Admitting: Orthopedic Surgery

## 2015-05-14 ENCOUNTER — Other Ambulatory Visit: Payer: Self-pay | Admitting: Primary Care

## 2015-05-20 ENCOUNTER — Encounter: Payer: Self-pay | Admitting: Primary Care

## 2015-05-20 MED ORDER — ALPRAZOLAM 0.5 MG PO TABS *I*
ORAL_TABLET | ORAL | 0 refills | Status: DC
Start: 2015-05-20 — End: 2017-04-18

## 2015-06-04 ENCOUNTER — Ambulatory Visit: Payer: Self-pay | Admitting: Primary Care

## 2015-06-04 ENCOUNTER — Encounter: Payer: Self-pay | Admitting: Primary Care

## 2015-06-04 VITALS — BP 136/88 | HR 70 | Temp 97.8°F | Ht 64.5 in | Wt 212.6 lb

## 2015-06-04 DIAGNOSIS — J329 Chronic sinusitis, unspecified: Secondary | ICD-10-CM

## 2015-06-04 MED ORDER — AMOXICILLIN-POT CLAVULANATE 875-125 MG PO TABS *I*
1.0000 | ORAL_TABLET | Freq: Two times a day (BID) | ORAL | 0 refills | Status: DC
Start: 2015-06-04 — End: 2015-12-24

## 2015-06-04 NOTE — Progress Notes (Signed)
CCuri      HPI  She got sick 10 days ago--initially was a bad sore throat and then head congestion, congestion got worse, even stayed home 2 days last week, had vomiting for 1 day, no fever, some ear pressure/ pain,b/l teeth pain, some loss of smell, decreased ability to breath through the nose,pos cough, lots of post nasal drip ,pos fatigue, not rested in am,        Review of systems    Feeling fine  No fever  No headache  No chest pain  No shortness of breath  No cough  No change of appetite  No nausea  No vomiting  No abdominal pain  No stool changes  No urinary symptoms  No dizziness  No lightheadedness  No depression  No skin rashes        Current Outpatient Prescriptions   Medication Sig Dispense Refill    ALPRAZolam (XANAX) 0.5 MG tablet Take 1/2  to 1 tablet every 8 hours as needed--beware of sedation 30 tablet 0    buPROPion (WELLBUTRIN XL) 150 MG 24 hr tablet TAKE 1 TABLET BY MOUTH EVERY MORNING SWALLOW WHOLE, DO NOT BREAK,CRUSH OR CHEW 30 tablet 0    Nitrofurantoin Monohyd Macro (MACROBID PO) Take by mouth   Prn through gyn      Calcium 500 MG tablet Take 1 tablet by mouth 2 times daily      Cholecalciferol (VITAMIN D3) 1000 UNIT tablet Take 1,000 Units by mouth every morning         rizatriptan (MAXALT) 10 MG tablet Take 10 mg by mouth as needed   TAKE 1 TABLET AT ONSET OF HEADACHE. MAY REPEAT EVERY 2 HOURS AS NEEDED. MAXIMUM 3 TABLETS IN 24 HOURS.      Ibuprofen (ADVIL PO) Take 600 mg by mouth every 6 hours as needed          No current facility-administered medications for this visit.          Vitals:    06/04/15 1436   BP: 136/88   Pulse:    Temp:    Weight:    Height:          EXAM  Gen: patient appears comfortable and in no distress  HEENT normocephalic atraumatic oropharynx clear nasal mucosa grossly normal but positive maxillary sinus pain to palpation ear canals and TMs grossly normal neck supple no adenopathy  Cv: RRR, Nl S1,S2  Resp: CTA b/l, no rales, no rhonchi, no wheezing or  tachypnea  Ext: no edema          A/P pleasant female with signs and symptoms suggestive of acute sinusitis she is not feeling any better in fact worse the last couple days and it's been 10 days long we'll put her on Augmentin twice a day for 2 weeks for sinus infection treatment.  She may continue the Advil Cold and Sinus recommend at night though to just try four-way nasal spray or Afrin for a few nights only lots of fluids saline nasal spray rest follow-up if worsens or does not improve nothing to suggest pneumonia on exam

## 2015-06-21 ENCOUNTER — Other Ambulatory Visit: Payer: Self-pay | Admitting: Primary Care

## 2015-07-23 ENCOUNTER — Other Ambulatory Visit: Payer: Self-pay | Admitting: Primary Care

## 2015-07-23 MED ORDER — BUPROPION HCL 150 MG PO TB24 *I*
ORAL_TABLET | ORAL | 0 refills | Status: DC
Start: 2015-07-23 — End: 2015-08-22

## 2015-08-05 ENCOUNTER — Other Ambulatory Visit: Payer: Self-pay | Admitting: Gastroenterology

## 2015-08-05 LAB — HM MAMMOGRAPHY

## 2015-08-09 ENCOUNTER — Ambulatory Visit: Payer: Self-pay | Admitting: Orthopedic Surgery

## 2015-08-12 ENCOUNTER — Encounter: Payer: Self-pay | Admitting: Primary Care

## 2015-08-22 ENCOUNTER — Other Ambulatory Visit: Payer: Self-pay | Admitting: Primary Care

## 2015-08-26 ENCOUNTER — Encounter: Payer: Self-pay | Admitting: Orthopedic Surgery

## 2015-08-26 ENCOUNTER — Ambulatory Visit: Payer: Self-pay | Admitting: Orthopedic Surgery

## 2015-08-26 VITALS — BP 133/82 | HR 89 | Ht 65.0 in | Wt 210.0 lb

## 2015-08-26 DIAGNOSIS — M1712 Unilateral primary osteoarthritis, left knee: Secondary | ICD-10-CM

## 2015-08-26 HISTORY — DX: Unilateral primary osteoarthritis, left knee: M17.12

## 2015-08-26 NOTE — Progress Notes (Signed)
PATIENTJULIET, [name] MR #:  [id number]   ACCOUNT #:  [id number] DOB:  Nov 14, 1955   DICTATED BY:  Minta Balsam, PA DATE OF VISIT:  08/26/2015     CHIEF COMPLAINT:  Followup for left knee.    INTERVAL HISTORY:  The patient was last seen on 01/28/2015 for a corticosteroid injection.  She found that helpful. She has not sustained any new injuries.  She says she tries to postpone injections as long as possible because of her concern that it may make them work less well over time. I have discussed with her the fact that it lasts about 3 months in any case. She says that she is still doing well and that the injections certainly do last 3 months.    PHYSICAL EXAMINATION:  Exam of the left knee reveals no warmth or redness. Small effusion. Range of motion 0-120. Medial joint line and patellofemoral crepitation.    ASSESSMENT:  Osteoarthritis, left knee.    PLAN:  Under sterile precautions, I injected the left knee using 80 mg Depo-Medrol and 6 cc 1% Xylocaine.  She tolerated the procedure well, was given post-injection instructions, and will return to the office in 3 months for followup. She is hoping to time the injections such that she can have one around the time of her daughter's wedding in September.       Dictated By:  Minta Balsam, PA      ______________________________  Haze Boyden, MD    LJS/MODL  DD:  08/26/2015 08:46:06  DT:  08/26/2015 09:18:07  Job #:  [id number]    cc:

## 2015-11-12 ENCOUNTER — Encounter: Payer: Self-pay | Admitting: Primary Care

## 2015-11-19 ENCOUNTER — Ambulatory Visit: Payer: Self-pay | Admitting: Orthopedic Surgery

## 2015-11-20 ENCOUNTER — Encounter: Payer: Self-pay | Admitting: Primary Care

## 2015-11-21 ENCOUNTER — Telehealth: Payer: Self-pay | Admitting: Primary Care

## 2015-11-21 ENCOUNTER — Other Ambulatory Visit: Payer: Self-pay | Admitting: Primary Care

## 2015-11-21 NOTE — Telephone Encounter (Signed)
She is due for a brief follow-up appointment with me

## 2015-11-26 NOTE — Telephone Encounter (Signed)
Left message to call me.

## 2015-11-28 NOTE — Telephone Encounter (Signed)
Spoke with Malachy Mood:  Scheduled visit June 27.

## 2015-12-02 ENCOUNTER — Encounter: Payer: Self-pay | Admitting: Orthopedic Surgery

## 2015-12-02 ENCOUNTER — Ambulatory Visit: Payer: Self-pay | Admitting: Orthopedic Surgery

## 2015-12-02 VITALS — BP 135/66 | HR 65 | Ht 65.0 in | Wt 219.0 lb

## 2015-12-02 DIAGNOSIS — M1712 Unilateral primary osteoarthritis, left knee: Secondary | ICD-10-CM

## 2015-12-02 NOTE — Progress Notes (Signed)
[name], [name] MR #:  [id number]   ACCOUNT #:  [id number] DOB:  08-25-1955   DICTATED BY:  Victoria Balsam, PA DATE OF VISIT:  12/02/2015     CHIEF COMPLAINT:  Left knee pain.    INTERVAL HISTORY:  The patient was seen 3 months ago and had a corticosteroid injection for the left knee.  She said it was very helpful.  She is continuing to work and has not sustained any new injuries.    PHYSICAL EXAMINATION:  Exam of the left knee reveals a small effusion.  Tenderness primarily medially.  Also patellofemoral crepitation and irritability.  Range of motion 0-120.    ASSESSMENT:  Osteoarthritis of the left knee.  We reviewed her x-rays and these are admittedly 58 years old, but because she is still doing well with the corticosteroid injection, I did not see an indication to do new x-rays today.  We talked about the possibility that at a future visit, depending on how she is doing.  She indicates to me that several of her family members have already had total knee replacements and so she understands that the potential for her.  We will see her back in 3 months just prior to her son's wedding in September.       Dictated By:  Victoria Balsam, PA      ______________________________  Victoria Boyden, MD    LJS/MODL  DD:  12/02/2015 09:24:19  DT:  12/02/2015 10:07:21  Job #:  [id number]    cc:

## 2015-12-19 ENCOUNTER — Other Ambulatory Visit: Payer: Self-pay | Admitting: Orthopedic Surgery

## 2015-12-19 NOTE — Progress Notes (Signed)
The patient was last seen on 12/02/15 and received a corticosteroid injection for her left knee. The left knee was injected using 80mg  depo-medrol and 6cc 1% xylocaine, anterolaterally. She tolerated the procedure well and was given post-injection instructions.

## 2015-12-24 ENCOUNTER — Ambulatory Visit: Payer: Self-pay | Admitting: Primary Care

## 2015-12-24 ENCOUNTER — Encounter: Payer: Self-pay | Admitting: Primary Care

## 2015-12-24 VITALS — BP 126/84 | HR 64 | Ht 65.0 in | Wt 214.7 lb

## 2015-12-24 DIAGNOSIS — F32A Depression, unspecified: Secondary | ICD-10-CM

## 2015-12-24 DIAGNOSIS — G4700 Insomnia, unspecified: Secondary | ICD-10-CM

## 2015-12-24 DIAGNOSIS — K219 Gastro-esophageal reflux disease without esophagitis: Secondary | ICD-10-CM

## 2015-12-24 MED ORDER — GABAPENTIN 100 MG PO CAPSULE *I*
100.0000 mg | ORAL_CAPSULE | Freq: Every evening | ORAL | 1 refills | Status: DC
Start: 2015-12-24 — End: 2016-04-14

## 2015-12-24 MED ORDER — RANITIDINE HCL 150 MG PO TABS *I*
150.0000 mg | ORAL_TABLET | Freq: Every evening | ORAL | 3 refills | Status: DC
Start: 2015-12-24 — End: 2018-08-31

## 2015-12-24 MED ORDER — BUPROPION HCL 150 MG PO TB24 *I*
ORAL_TABLET | ORAL | 5 refills | Status: DC
Start: 2015-12-24 — End: 2016-07-06

## 2015-12-24 NOTE — Progress Notes (Signed)
CC: f/u      HPI mood--okay ,on low dose bupropion xl--doing well, 8/10, sleep is not great as worries about stuff--family and work, awakens in the middle of night at 2pm and then has trouble staying asleep      gerd about 3 times a week, diet related and worse with chocolate and wine or coffee--rec'd try zantac 150mg  daily      Review of systems    Feeling fine  No fever  rare headache  No chest pain  No shortness of breath  No cough  No change of appetite  No nausea  No urinary symptoms  No dizziness          Current Outpatient Prescriptions   Medication Sig Dispense Refill    buPROPion (WELLBUTRIN XL) 150 MG 24 hr tablet TAKE 1 TABLET BY MOUTH ONCE DAILY IN THE MORNING. SWALLOW WHOLE. DO NOT BREAK, CRUSH, OR CHEW. 30 tablet 0    ALPRAZolam (XANAX) 0.5 MG tablet Take 1/2  to 1 tablet every 8 hours as needed--beware of sedation 30 tablet 0    Calcium 500 MG tablet Take 1 tablet by mouth 2 times daily      Cholecalciferol (VITAMIN D3) 1000 UNIT tablet Take 1,000 Units by mouth every morning         rizatriptan (MAXALT) 10 MG tablet Take 10 mg by mouth as needed   TAKE 1 TABLET AT ONSET OF HEADACHE. MAY REPEAT EVERY 2 HOURS AS NEEDED. MAXIMUM 3 TABLETS IN 24 HOURS.      Ibuprofen (ADVIL PO) Take 600 mg by mouth every 6 hours as needed          No current facility-administered medications for this visit.          Vitals:    12/24/15 1046   BP: 126/84   Pulse: 64   Weight: 97.4 kg (214 lb 11.2 oz)   Height: 1.651 m (5\' 5" )         EXAM  Gen: patient appears comfortable and in no distress  Cv: RRR, Nl S1,S2  Resp: CTA b/l, no rales, no rhonchi  Ext: no edema          A/PPleasant female seen here for follow-up of mood and review of other issues    Mood doing well continue bupropion this is renewed she has a physical with me in several months no plans on hurting himself or others doing well.    Heartburn we'll try Zantac nightly or at the very least the nights where she knows she is having something that will bother  her    Insomnia we'll try gabapentin 100-300 mg at night

## 2016-01-16 ENCOUNTER — Encounter: Payer: Self-pay | Admitting: Primary Care

## 2016-02-06 ENCOUNTER — Encounter: Payer: Self-pay | Admitting: Primary Care

## 2016-03-04 ENCOUNTER — Other Ambulatory Visit: Payer: Self-pay | Admitting: Orthopedic Surgery

## 2016-03-04 DIAGNOSIS — M1712 Unilateral primary osteoarthritis, left knee: Secondary | ICD-10-CM

## 2016-03-06 ENCOUNTER — Ambulatory Visit: Payer: Self-pay | Admitting: Orthopedic Surgery

## 2016-03-06 ENCOUNTER — Encounter: Payer: Self-pay | Admitting: Orthopedic Surgery

## 2016-03-06 VITALS — BP 146/66 | HR 77 | Ht 65.0 in | Wt 210.0 lb

## 2016-03-06 DIAGNOSIS — M1712 Unilateral primary osteoarthritis, left knee: Secondary | ICD-10-CM

## 2016-03-07 NOTE — Progress Notes (Signed)
PATIENTAZAREYA, [name] MR #:  [id number]   ACCOUNT #:  [id number] DOB:  [birth year]   DICTATED BY:  Debbe Odea, NP DATE OF VISIT:  03/06/2016     CHIEF COMPLAINT:  Followup for left knee.    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  She has done well since our last injection on 12/02/2015.  She experienced recurrent symptoms during a walk on Labor Day.  It has been several years since her last set of radiographs and she is receptive to repeating these today.  She has had no new injuries or mechanical symptoms since our last visit.    PHYSICAL EXAM:  Vital signs are obtained by nursing and reviewed.  There is a small left knee effusion.  Skin is intact without warmth or erythema.  She demonstrates range of motion 0-120 with patellofemoral crepitation.    IMAGING:  Radiographs of the left knee show moderate to severe medial compartment narrowing with spur formation.  No acute findings.  There are degenerative changes on the contralateral side all of which have progressed compared to films in 2012.    ASSESSMENT:  Left knee arthritis.    PLAN:  Radiographs with progressive findings are reviewed.  Treatment options are explored and she would like to continue with conservative management.  We will repeat the corticosteroid injection today.  She is traveling to [place] in the coming weeks for a wedding.  We will see her back in 3-4 months or sooner as symptoms dictate.    PROCEDURE NOTE:  The risks, benefits, and alternatives to injection are explained.  After verbal confirmation of the injection site a time-out is performed.  The correct procedure, positioning, and site is confirmed with a marking pen.  Betadine-based prep is performed and ethyl chloride is offered.  Using sterile technique, a single injection of 80 mg Depo-Medrol with 4 cc of 1% lidocaine is instilled into  the left anterolateral knee without difficulty.  Patient tolerated this well without complication.  Post injection instruction sheet is provided.       Dictated By:  Debbe Odea, NP      ______________________________  Victoria Boyden, MD    CLB/MODL  DD:  03/07/2016 12:51:56  DT:  03/07/2016 13:06:12  Job #:  [id number]    cc:

## 2016-03-21 ENCOUNTER — Telehealth: Payer: Self-pay | Admitting: Primary Care

## 2016-03-21 DIAGNOSIS — Z0000 Encounter for general adult medical examination without abnormal findings: Secondary | ICD-10-CM

## 2016-03-21 DIAGNOSIS — E559 Vitamin D deficiency, unspecified: Secondary | ICD-10-CM

## 2016-03-21 DIAGNOSIS — E669 Obesity, unspecified: Secondary | ICD-10-CM

## 2016-03-21 DIAGNOSIS — F32A Depression, unspecified: Secondary | ICD-10-CM

## 2016-03-21 DIAGNOSIS — G4730 Sleep apnea, unspecified: Secondary | ICD-10-CM

## 2016-03-21 NOTE — Telephone Encounter (Signed)
Please notify pt that I have sent in to [facility] his lab requisition for the fasting labs for upcoming physical

## 2016-03-26 NOTE — Telephone Encounter (Signed)
Left a message reminding patient to have fasting labs done before her upcoming physical at any [facility] lab.

## 2016-04-04 ENCOUNTER — Other Ambulatory Visit
Admission: RE | Admit: 2016-04-04 | Discharge: 2016-04-04 | Disposition: A | Discharge status: Home / Self Care | Payer: Self-pay | Source: Ambulatory Visit | Attending: Primary Care | Admitting: Primary Care

## 2016-04-04 DIAGNOSIS — F32A Depression, unspecified: Secondary | ICD-10-CM

## 2016-04-04 DIAGNOSIS — G4730 Sleep apnea, unspecified: Secondary | ICD-10-CM

## 2016-04-04 DIAGNOSIS — E559 Vitamin D deficiency, unspecified: Secondary | ICD-10-CM

## 2016-04-04 DIAGNOSIS — Z0000 Encounter for general adult medical examination without abnormal findings: Secondary | ICD-10-CM

## 2016-04-04 DIAGNOSIS — E669 Obesity, unspecified: Secondary | ICD-10-CM

## 2016-04-04 LAB — CBC AND DIFFERENTIAL
Baso # K/uL: 0 10*3/uL (ref 0.0–0.1)
Basophil %: 0.5 %
Eos # K/uL: 0.1 10*3/uL (ref 0.0–0.4)
Eosinophil %: 1.6 %
Hematocrit: 41 % (ref 34–45)
Hemoglobin: 13 g/dL (ref 11.2–15.7)
IMM Granulocytes #: 0 10*3/uL (ref 0.0–0.1)
IMM Granulocytes: 0.4 %
Lymph # K/uL: 1.7 10*3/uL (ref 1.2–3.7)
Lymphocyte %: 30 %
MCH: 28 pg/cell (ref 26–32)
MCHC: 32 g/dL (ref 32–36)
MCV: 87 fL (ref 79–95)
Mono # K/uL: 0.4 10*3/uL (ref 0.2–0.9)
Monocyte %: 7.6 %
Neut # K/uL: 3.4 10*3/uL (ref 1.6–6.1)
Nucl RBC # K/uL: 0 10*3/uL (ref 0.0–0.0)
Nucl RBC %: 0 /100 WBC (ref 0.0–0.2)
Platelets: 199 10*3/uL (ref 160–370)
RBC: 4.7 MIL/uL (ref 3.9–5.2)
RDW: 14.6 % — ABNORMAL HIGH (ref 11.7–14.4)
Seg Neut %: 59.9 %
WBC: 5.6 10*3/uL (ref 4.0–10.0)

## 2016-04-04 LAB — LIPID PANEL
Chol/HDL Ratio: 2.3
Cholesterol: 225 mg/dL — AB
HDL: 96 mg/dL
LDL Calculated: 120 mg/dL
Non HDL Cholesterol: 129 mg/dL
Triglycerides: 47 mg/dL

## 2016-04-04 LAB — ALT: ALT: 15 U/L (ref 0–35)

## 2016-04-04 LAB — URINALYSIS WITH MICROSCOPIC
Blood,UA: NEGATIVE
Ketones, UA: NEGATIVE
Leuk Esterase,UA: NEGATIVE
Nitrite,UA: NEGATIVE
Protein,UA: NEGATIVE mg/dL
RBC,UA: 1 /hpf (ref 0–2)
Specific Gravity,UA: 1.014 (ref 1.002–1.030)
WBC,UA: 3 /hpf (ref 0–5)
pH,UA: 8 (ref 5.0–8.0)

## 2016-04-04 LAB — BASIC METABOLIC PANEL
Anion Gap: 12 (ref 7–16)
CO2: 29 mmol/L — ABNORMAL HIGH (ref 20–28)
Calcium: 9.4 mg/dL (ref 8.6–10.2)
Chloride: 99 mmol/L (ref 96–108)
Creatinine: 0.75 mg/dL (ref 0.51–0.95)
GFR,Black: 102 *
GFR,Caucasian: 88 *
Glucose: 99 mg/dL (ref 60–99)
Lab: 12 mg/dL (ref 6–20)
Potassium: 4.7 mmol/L (ref 3.3–5.1)
Sodium: 140 mmol/L (ref 133–145)

## 2016-04-04 LAB — AST: AST: 15 U/L (ref 0–35)

## 2016-04-07 LAB — VITAMIN D
25-OH VIT D2: <4 ng/mL
25-OH VIT D3: 32 ng/mL
25-OH Vit Total: 32 ng/mL (ref 30–60)

## 2016-04-14 ENCOUNTER — Ambulatory Visit: Payer: Self-pay | Admitting: Primary Care

## 2016-04-14 ENCOUNTER — Encounter: Payer: Self-pay | Admitting: Primary Care

## 2016-04-14 VITALS — BP 134/76 | HR 72 | Ht 65.0 in | Wt 217.5 lb

## 2016-04-14 DIAGNOSIS — Z0000 Encounter for general adult medical examination without abnormal findings: Secondary | ICD-10-CM

## 2016-04-14 DIAGNOSIS — L719 Rosacea, unspecified: Secondary | ICD-10-CM

## 2016-04-14 DIAGNOSIS — F32A Depression, unspecified: Secondary | ICD-10-CM

## 2016-04-14 DIAGNOSIS — E559 Vitamin D deficiency, unspecified: Secondary | ICD-10-CM

## 2016-04-14 DIAGNOSIS — G43909 Migraine, unspecified, not intractable, without status migrainosus: Secondary | ICD-10-CM

## 2016-04-14 DIAGNOSIS — E663 Overweight: Secondary | ICD-10-CM

## 2016-04-14 DIAGNOSIS — M1712 Unilateral primary osteoarthritis, left knee: Secondary | ICD-10-CM

## 2016-04-14 DIAGNOSIS — G4730 Sleep apnea, unspecified: Secondary | ICD-10-CM

## 2016-04-14 MED ORDER — MIRTAZAPINE 7.5 MG PO TABS *A*
7.5000 mg | ORAL_TABLET | Freq: Every evening | ORAL | 0 refills | Status: DC
Start: 2016-04-14 — End: 2016-10-13

## 2016-04-14 NOTE — H&P (Signed)
Chief complaint:Pleasant female seen here for physical as well as review of multiple issues we reviewed together family history past medical history medications allergies and immunizations.  Recent lab work reviewed together.         HPI:    Left knee djd---trying to hold off on replacement--will try 2000-3000mg  tylenol throughout the day--if not helpful then celebrex 200mg  every day or meloxicam --less gi risk then ibuprofen.  Reviewed the risks of NSAIDs including elevated blood pressure GI bleeding kidney dysfunction.  She would like to hold off on knee replacement she does see orthopedics      Insomnia--gabapentin made her loopy--will try half of a 7.5mg  remeron --if fails perhaps generic ambien at a low dose already failed gabapentin as well    Overweight--stable--hard to exercise with bad knee    Mood--okay on generic Wellbutrin no issues    Rosacea no further issues    Flu shot to be given today per request    Vitamin D deficiency level good continue supplement    Vit d def--level is good     Migraines--controlled    Sleep apnea--has declined cpap in past per patient was told she was not severe enough that required CPAP and has lost weight since diagnosis    At risk of breast cancer mother and sister with breast cancer grandmother had I believe ovarian cancer offered genetic testing consultations she will consider it also recommended breast MRI as per [facility] recommendations she is aware and will discuss with them at follow-up appointment for mammogram    Elevated total cholesterol but breakdown reasonable only 2.1% 10 year cardiovascular risk known drug trees              Current Outpatient Prescriptions   Medication Sig Dispense Refill    nitrofurantoin monohydrate macrocrystal (MACROBID) 100 MG capsule Take 100 mg by mouth 2 times daily   Prn through gyn      ranitidine (ZANTAC) 150 MG tablet Take 1 tablet (150 mg total) by mouth nightly (Patient taking differently: Take 150 mg by  mouth 2 times daily as needed   ) 30 tablet 3    buPROPion (WELLBUTRIN XL) 150 MG 24 hr tablet .TAKE 1 TABLET BY MOUTH ONCE DAILY IN THE MORNING. SWALLOW WHOLE. DO NOT BREAK, CRUSH, OR CHEW. 30 tablet 5    ALPRAZolam (XANAX) 0.5 MG tablet Take 1/2  to 1 tablet every 8 hours as needed--beware of sedation 30 tablet 0    Calcium 500 MG tablet Take 1 tablet by mouth daily         Cholecalciferol (VITAMIN D3) 1000 UNIT tablet Take 1,000 Units by mouth every morning         rizatriptan (MAXALT) 10 MG tablet Take 10 mg by mouth as needed   TAKE 1 TABLET AT ONSET OF HEADACHE. MAY REPEAT EVERY 2 HOURS AS NEEDED. MAXIMUM 3 TABLETS IN 24 HOURS.      Ibuprofen (ADVIL PO) Take 600 mg by mouth every 6 hours as needed          No current facility-administered medications for this visit.         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                 Past Medical History:   Diagnosis Date    Abdominal pain 07/19/2012    Anxiety 3/12    Benign neoplasm of colon     Calcaneal bursitis 01/14/2012    right    Chronic cholecystitis 0000000    Complication of anesthesia     PONV    Depression     Diverticulosis 11/27/2013    mild    Internal hemorrhoids     Migraine     q2-3 months    Mitral valve prolapse     OA (osteoarthritis)     left knee    Osteoarthritis, knee 12/03/2010    Rosacea 04/05/2003          Ruptured cerebral aneurysm     age 28    Sleep apnea     does not use CPAP    Syncope     d/t migraines    Urinary tract infection     frequent  3 this year    Vertigo         Past  Surgical History:   Procedure Laterality Date    CESAREAN SECTION, CLASSIC  '86 '88    spinal    CHOLECYSTECTOMY, LAPAROSCOPIC  11/16/2012    COLONOSCOPY  2012    COLONOSCOPY  11/24/2013    10/2018    KNEE ARTHROSCOPY Left 06/19/11    strabismus correction Right 1972, 1993    x2--child + 82 yo    [place]    4 wisdom and B/L bicuspids        Family History   Problem Relation Age of Onset    Breast cancer Mother     Depression Mother     Stroke Father     Heart failure Father     Heart Disease Father     Breast cancer Sister     Asthma Sister     Ovarian cancer Maternal Grandmother     Colon cancer Maternal Grandfather     Depression Sister     Depression Brother     Depression Daughter     Stroke Paternal Grandmother     Stroke Paternal Grandfather     Substance abuse Sister      Drug - past    Substance abuse Brother      Alcohol - current       Review of systems:    Generally feeling:  Well except left knee  Feeling tired:  No  Fever:  No  Chills:  No  Recent weight change:  No  Headache:  Migraines controlled  Vision problems:  No  Hearing loss:  No  Earache:  No  Ringing in the ears (tinnitus):  No  Nasal symptoms:  No  Sore throat:  No  Chest pain:  No  Palpitations:  No  Leg pain with exercise (claudication):  No  Shortness of breath:  No  Cough:  No   wheezing:  No  Change in appetite:  No  Heartburn:  No further  Nausea:  No  Vomiting:  No  Abdominal pain:  No  Change in the stool:  No  Diarrhea:  No  Constipation:  No  Blood in  urine:  No  Increased urinary frequency:  No  Pain during urination:  No  Excessive thirst or fluid intake:  No  Excessive sweating:  No  Easy bleeding:  No  Easy bruising tendency:  Always have  Back pain:  No  Generalized muscle aches:  No  Localized joint pain:  Left knee  Dizziness:  Vertigo on occasion  Lightheadedness:  No  Fainting:  No  Memory loss:  No  Anxiety:  No  Depression:  Doing okay  Itching:  No  Skin rash:  No  Skin lesion:   No    Social history extended    Caffeine use:3 cups a day  Tobacco [id number]  Alcohol use:1 a week  Drug [id number]  Sleep habits:fals asleep fine but then hard to stay asleep and worries---better when not working or on vacation  Sunscreen [id number] least yrly  Ophthalmology:good  Dental hygiene:good  Dentist:good  Exercise habits:bothered by knee--3 days a week 5-26min bike  Work history:full time  married     Vitals:    04/14/16 0857   BP: 134/76   Pulse:    Weight:    Height:      Body mass index is 36.19 kg/(m^2).    Physical exam:    General appearance:  Overweight habitus.  Well-developed, well groomed.  Appears stated age.  No acute distress.  Color good.  Mental status:  Appears alert and oriented.  Affect appropriate.  Skin:  Skin color and turgor normal.  No suspicious lesions, masses, rashes, or ulcerations.  Nails  and hair appear normal.  Head:  Normocephalic  Ears:  External ears without scars, masses, or lesions.  External auditory canal intact, clear, and without lesions.  TMs intact with normal light reflex and landmarks.  Acuity to conversational tones good.  Eyes: PERRL, extraocular movements intact.  Lids without defect, conjunctiva and sclera appear normal.  Fundi difficult to visualize  Nose:  Nasal mucosa and turbinates pink, septum midline, no lesions.  Mouth:  Teeth in good repair.  Gums pink without lesions.  Normal appearing mucosa, palate, and tongue.  Oropharynx:  Moist without exudate, erythema, or swelling.  Neck:  Symmetric, trachea midline.  Thyroid nontender without enlargement or masses.  Carotids with no bruits bilaterally.  No cervical lymphadenopathy.  Breasts:  Breasts appear normal and symmetric without palpable masses, skin changes, or nipple inversion.  No discharge, rash, or skin retraction.  No axillary or supraclavicular lymphadenopathy.  Chest:  Respirations unlabored.  Chest was symmetric with no masses.  Breath sounds clear bilaterally without wheezes, rubs, rales,  or rhonchi.  CV:  Normal S1 and S2 without murmur, rub, gallop, or click.  No edema, clubbing, or cyanosis.    Dorsalis pedis pulses full and symmetrical.  Abdomen:  Abdomen soft with normal bowel sounds.  No guarding or rebound.  No palpable masses or tenderness.  Liver without tenderness or enlargement.  No aortic widening.  GU  MuAnd rectal deferred to GYNsculoskeletal:  Muscle tone and strength normal for age, without atrophy or abnormal movements.  Extremities:  Joints with full range of motion  Neuro:  Cranial nerves II through XII grossly intact.  Motor strength symmetrical without obvious weakness.  Superficial sensation intact bilaterally to light touch.  Observed dexterity without ataxia or tremor.  Deep tendon reflexes 1+ and symmetric bilaterally.  Assessment and plan:         Pleasant female seen here for physical and review of several issues.     EKG declined today by patient flu shot given    Heartburn controlled with when necessary Zantac but takes too much of it she gets gas and burping etc. only uses it when needed    Overweight encouraged decrease calorie intake and regular exercise    Migraine headaches continue triptan    Rosacea resolved see dermatology regularly uses sunscreen    Sleep apnea---Patient did not add mentally require CPAP per prior testing per patient--- at least it was not mandatory per patient she is aware of the risks of untreated sleep apnea but feels generally rested during the day    Insomnia middle the night will try low-dose Remeron half of a 7.5 mg tablet daily if fails perhaps Ambien    Vitamin D deficiency continue supplement    Left knee osteoarthritis continue follow-up with orthopedics tried 2000 3000 mg total per day of Tylenol in divided doses if fails would recommend Celebrex or meloxicam daily risks of NSAIDs reviewed with patient    Mood doing well with Wellbutrin no thoughts hurting self or others follow-up in 4 months    Cancer risk recommended and  encouraged consultation with genetics.  She will consider it.  She will also discuss breast MRI with breast clinic Appointment as per their recommendation.  She continues to do self breast exams.      Counseling and education:      Healthcare proxy:has one per pt  Lab results:reviewed  Diet and bodyweight discussed  Aerobic exercise discussed  Alcohol use discussed  Recreational drug use discussed  Colon cancer screening discussed--colonoscopy 2015  Ob/Gyn care discussed--Dr morningstar  Osteoporosis prevention and screening discussed  Breast self-exam discussed  Mammogram discussed  Zostavax vaccine discussedno  Skin cancer awareness and prevention discussed  Cardiac risk factor modification discussed  Motor vehicle safety discussed  Next H&P:2 yrs

## 2016-06-19 ENCOUNTER — Ambulatory Visit: Payer: Self-pay | Admitting: Orthopedic Surgery

## 2016-07-06 ENCOUNTER — Other Ambulatory Visit: Payer: Self-pay | Admitting: Primary Care

## 2016-07-06 NOTE — Telephone Encounter (Signed)
Last visit was 03/2016

## 2016-08-04 ENCOUNTER — Other Ambulatory Visit: Payer: Self-pay | Admitting: Gastroenterology

## 2016-08-04 LAB — HM MAMMOGRAPHY

## 2016-08-05 ENCOUNTER — Encounter: Payer: Self-pay | Admitting: Primary Care

## 2016-08-11 ENCOUNTER — Ambulatory Visit: Payer: Self-pay | Admitting: Primary Care

## 2016-08-11 VITALS — BP 132/82 | HR 78 | Ht 64.96 in | Wt 225.0 lb

## 2016-08-11 DIAGNOSIS — M1712 Unilateral primary osteoarthritis, left knee: Secondary | ICD-10-CM

## 2016-08-11 DIAGNOSIS — L84 Corns and callosities: Secondary | ICD-10-CM

## 2016-08-11 DIAGNOSIS — E663 Overweight: Secondary | ICD-10-CM

## 2016-08-11 DIAGNOSIS — F32A Depression, unspecified: Secondary | ICD-10-CM

## 2016-08-11 MED ORDER — ZALEPLON 5 MG PO CAPS *A*
5.0000 mg | ORAL_CAPSULE | Freq: Every evening | ORAL | 1 refills | Status: DC | PRN
Start: 2016-08-11 — End: 2017-04-18

## 2016-08-11 NOTE — Progress Notes (Signed)
CCf/u      HPI    Mood--managing okay but has been under lots of stress with work, most of time mood is good , work is settling down--declines med change    Insomnia--the gabapentin did not work and mirtazapine was too strong even at 1/2 tablet before bedtime, does not use the xanax anymore--will try the sonata    Wt up several more pounds--but cannot exercise due to the knee and may get replacement    Patient also complaining of a corn or wart on the bottom of her left foot.  She would like to consider seeing a podiatrist.  She has tried a wart pad on it.  Has not helped.  Started in November.  Feels like walking on a stone.    Review of systems    Feeling fine  No fever  No headache  No chest pain with activity only occasionally if sitting at desk and very anxious never when exercising on her cycle  No shortness of breath  No cough  No change of appetite  No nausea  No vomiting  No abdominal pain  No stool changes  No urinary symptoms  No dizziness  No lightheadedness            Current Outpatient Prescriptions   Medication Sig Dispense Refill    buPROPion (WELLBUTRIN XL) 150 MG 24 hr tablet TAKE 1 TABLET BY MOUTH ONCE DAILY IN THE MORNING. SWALLOW WHOLE. DO NOT BREAK, CRUSH, OR CHEW. 30 tablet 2    nitrofurantoin monohydrate macrocrystal (MACROBID) 100 MG capsule Take 100 mg by mouth 2 times daily   Prn through gyn      mirtazapine (REMERON) 7.5 MG tablet Take 1 tablet (7.5 mg total) by mouth nightly 30 tablet 0    ranitidine (ZANTAC) 150 MG tablet Take 1 tablet (150 mg total) by mouth nightly (Patient taking differently: Take 150 mg by mouth 2 times daily as needed   ) 30 tablet 3    ALPRAZolam (XANAX) 0.5 MG tablet Take 1/2  to 1 tablet every 8 hours as needed--beware of sedation 30 tablet 0    Calcium 500 MG tablet Take 1 tablet by mouth daily         Cholecalciferol (VITAMIN D3) 1000 UNIT tablet Take 1,000 Units by mouth every morning         rizatriptan (MAXALT) 10 MG tablet Take 10 mg by mouth as  needed   TAKE 1 TABLET AT ONSET OF HEADACHE. MAY REPEAT EVERY 2 HOURS AS NEEDED. MAXIMUM 3 TABLETS IN 24 HOURS.      Ibuprofen (ADVIL PO) Take 600 mg by mouth every 6 hours as needed          No current facility-administered medications for this visit.          Vitals:    08/11/16 0723   BP: 132/82   Pulse: 78   Weight: 102.1 kg (225 lb)   Height: 1.65 m (5' 4.96")         EXAM  Gen: patient appears comfortable and in no distress  Cv: RRR, Nl S1,S2  Resp: CTA b/l, no rales, no rhonchi  Ext: no edema  Skin sole distally of left foot with hyperkeratotic area debrided with a 15 blade scalpel consistent with a wart but more consistent with a corn.       Assessment and plan: Pleasant female seen here for several issues     Mood doing okay with bupropion she will continue  the same    Insomnia she will try Sonata 5 mg before bedtime only intermittently if needed she is aware no alcohol not to drive with that she does not take the Xanax anymore    Overweight encouraged decrease calorie intake and more regular exercise    Corn on foot debrided recommend changing shoewear if recurs or will call and set up with podiatry.

## 2016-08-28 ENCOUNTER — Ambulatory Visit: Payer: Self-pay | Admitting: Orthopedic Surgery

## 2016-10-13 ENCOUNTER — Ambulatory Visit: Payer: No Typology Code available for payment source | Attending: Primary Care | Admitting: Primary Care

## 2016-10-13 ENCOUNTER — Encounter: Payer: Self-pay | Admitting: Primary Care

## 2016-10-13 VITALS — BP 130/84 | HR 78 | Temp 97.7°F | Ht 65.0 in | Wt 224.4 lb

## 2016-10-13 DIAGNOSIS — F329 Major depressive disorder, single episode, unspecified: Secondary | ICD-10-CM

## 2016-10-13 DIAGNOSIS — J209 Acute bronchitis, unspecified: Secondary | ICD-10-CM

## 2016-10-13 DIAGNOSIS — G43909 Migraine, unspecified, not intractable, without status migrainosus: Secondary | ICD-10-CM

## 2016-10-13 DIAGNOSIS — F32A Depression, unspecified: Secondary | ICD-10-CM

## 2016-10-13 DIAGNOSIS — G4700 Insomnia, unspecified: Secondary | ICD-10-CM

## 2016-10-13 LAB — PCMH DEPRESSION ASSESSMENT

## 2016-10-13 MED ORDER — RIZATRIPTAN BENZOATE 5 MG PO TABS *I*
5.0000 mg | ORAL_TABLET | ORAL | 3 refills | Status: AC | PRN
Start: 2016-10-13 — End: ?

## 2016-10-13 MED ORDER — DOXYCYCLINE HYCLATE 100 MG PO TABS *I*
100.0000 mg | ORAL_TABLET | Freq: Two times a day (BID) | ORAL | 0 refills | Status: DC
Start: 2016-10-13 — End: 2017-04-18

## 2016-10-13 MED ORDER — ALBUTEROL SULFATE HFA 108 (90 BASE) MCG/ACT IN AERS *I*
1.0000 | INHALATION_SPRAY | Freq: Four times a day (QID) | RESPIRATORY_TRACT | 0 refills | Status: DC | PRN
Start: 2016-10-13 — End: 2017-04-18

## 2016-10-13 NOTE — Progress Notes (Addendum)
CCuri and other issues      HPI    Migraines--intermittent--maxalt helps but makes her lightheaded and tired--will renew at 5mg  dose instead of the 10mg     Insomnia---sonata works but even 5mg  makes her groggy---will try emptying some of the contents    Uri--began 10 days ago--initially chest congestion and sore throat and then into head with congestion--head congestion cleared mostly but came  Back strong last couple days ---last night heard a popping noise in chest,? Ewheezing, hard time sleeping, sat upright last night, sore throat is gone, no ear pain,no fever,no sob but chest tight when in the cold, cough is productive-- pale green to  Dark green,     Review of systems      No fever  No headache  No chest pain  No shortness of breath   cough   change of appetite  No nausea  No vomiting  No abdominal pain  No stool changes  No urinary symptoms  No dizziness  No lightheadedness   depression---doing fine--tired          Current Outpatient Prescriptions   Medication Sig Dispense Refill    zaleplon (SONATA) 5 MG capsule Take 1 capsule (5 mg total) by mouth nightly as needed for Sleep   Max daily dose: 5 mg 30 capsule 1    buPROPion (WELLBUTRIN XL) 150 MG 24 hr tablet TAKE 1 TABLET BY MOUTH ONCE DAILY IN THE MORNING. SWALLOW WHOLE. DO NOT BREAK, CRUSH, OR CHEW. 30 tablet 2    ranitidine (ZANTAC) 150 MG tablet Take 1 tablet (150 mg total) by mouth nightly (Patient taking differently: Take 150 mg by mouth 2 times daily as needed   ) 30 tablet 3    ALPRAZolam (XANAX) 0.5 MG tablet Take 1/2  to 1 tablet every 8 hours as needed--beware of sedation 30 tablet 0    Calcium 500 MG tablet Take 1 tablet by mouth daily         Cholecalciferol (VITAMIN D3) 1000 UNIT tablet Take 1,000 Units by mouth every morning         rizatriptan (MAXALT) 10 MG tablet Take 10 mg by mouth as needed   TAKE 1 TABLET AT ONSET OF HEADACHE. MAY REPEAT EVERY 2 HOURS AS NEEDED. MAXIMUM 3 TABLETS IN 24 HOURS.      Ibuprofen (ADVIL PO) Take 600  mg by mouth every 6 hours as needed         rizatriptan (MAXALT) 5 MG tablet Take 1 tablet (5 mg total) by mouth as needed for Migraine   May repeat in 2 hours if needed.  Max 6 tabs/day. 10 tablet 3     No current facility-administered medications for this visit.          Vitals:    10/13/16 1610   BP: 130/84   Pulse: 78   Temp: 36.5 C (97.7 F)   Weight: 101.8 kg (224 lb 6.4 oz)   Height: 1.651 m (5\' 5" )         EXAM  Gen: patient appears comfortable and in no distress  HEENT normocephalic atraumatic ear canals and TMs grossly normal oropharynx clear nasal mucosa with clear rhinorrhea present neck supple no adenopathy  Cv: RRR, Nl S1,S2  Resp: Positive disuse inspiratory and expiratory rhonchi with some expiratory wheezing no tachypnea and no dullness percussion  Ext: no edema      Oxygen saturation 99% room air temperature was 97.7    A/P pleasant female with signs  and symptoms suggestive of acute bronchitis has been sick for 10 days worse over the past to not getting better we'll put her on doxycycline twice a day for 7 days along with albuterol metered-dose inhaler hold on prednisone.  She is aware to call for sooner does not improve if she does not tolerate doxycycline we will change to Zithromax.  No signs or symptoms to suggest pneumonia at this time    Migraine headaches we will see if she feels less side effects with the 5 mg Maxalt.    Insomnia sonata works but even the 5 mg strength can make her groggy in the morning she will try taking some of the granules out of the capsules and see how she does.    Mood doing okay manageable continue present management.  Thank you

## 2016-10-16 ENCOUNTER — Other Ambulatory Visit: Payer: Self-pay | Admitting: Primary Care

## 2016-11-13 ENCOUNTER — Ambulatory Visit: Payer: No Typology Code available for payment source | Attending: Primary Care | Admitting: Orthopedic Surgery

## 2016-11-13 ENCOUNTER — Encounter: Payer: Self-pay | Admitting: Orthopedic Surgery

## 2016-11-13 VITALS — BP 118/75 | HR 80 | Ht 65.0 in | Wt 224.0 lb

## 2016-11-13 DIAGNOSIS — M1712 Unilateral primary osteoarthritis, left knee: Secondary | ICD-10-CM

## 2016-11-13 MED ORDER — LIDOCAINE HCL 1 % IJ SOLN *I*
5.0000 mL | Freq: Once | INTRAMUSCULAR | Status: AC | PRN
Start: 2016-11-13 — End: 2016-11-13
  Administered 2016-11-13: 5 mL via INTRA_ARTICULAR

## 2016-11-13 MED ORDER — ETHYL CHLORIDE EX AERO (MULTIPLE PATIENTS) *I*
1.0000 | INHALATION_SPRAY | Freq: Once | CUTANEOUS | Status: AC | PRN
Start: 2016-11-13 — End: 2016-11-13
  Administered 2016-11-13: 1 via TOPICAL

## 2016-11-13 MED ORDER — METHYLPREDNISOLONE ACETATE 40 MG/ML IJ SUSP *I*
80.0000 mg | Freq: Once | INTRAMUSCULAR | Status: AC | PRN
Start: 2016-11-13 — End: 2016-11-13
  Administered 2016-11-13: 09:00:00 80 mg via INTRA_ARTICULAR

## 2016-11-13 NOTE — Procedures (Signed)
Large Joint Aspiration/Injection Procedure    Date/Time: 11/13/2016 8:52 AM  Consent given by: patient  Site marked: site marked  Timeout: Immediately prior to procedure a time out was called to verify the correct patient, procedure, equipment, support staff and site/side marked as required     Procedure Details    Location: knee - L knee intra - articular  Preparation: The site was prepped using the usual aseptic technique.  Anesthetics administered: 5 mL lidocaine 1 %; 1 spray ethyl chloride  Intra-Articular Steroids administered: 80 mg methylPREDNISolone acetate 40 MG/ML  Dressing:  A dry, sterile dressing was applied.  Patient tolerance: patient tolerated the procedure well with no immediate complications

## 2016-11-13 NOTE — Progress Notes (Signed)
[name], [name]  MR #:  [id number]   CSN:  [id number] DOB:  1955/11/12   DICTATED BY:  Debbe Odea, NP DATE OF VISIT:  11/13/2016     CHIEF COMPLAINT:  Followup for left knee.    INTERVAL HISTORY:  Jeremiah is known to [place] following left knee arthroscopy on 06/19/2011, with findings notable for significant arthrosis; exposed bone at medial femur and tibia with bone to bone contact in medial compartment.  We have managed residual symptoms with intermittent corticosteroid injections.  She completed the Orthovisc series in October 2013.  Our last corticosteroid injection on 03/06/2016, provided some symptom improvement; she enjoyed her daughter's outdoor wedding.  Symptoms have returned.  She describes some instability when moving from sit to stand.  She has mostly medial based pain.  She denies actual give-way or locking.    PHYSICAL EXAMINATION:  She is well.  On focused exam of the left knee, there is no significant effusion.  No warmth or erythema.  Range of motion is 0 to 110 with crepitation.  She has some tenderness along the medial joint line.    ASSESSMENT AND PLAN:  Left knee arthritis.  Today's findings are discussed and treatment options are explored.  In the setting of some new instability, she would like to revisit a trial of viscosupplementation.  We will send her to our nonoperative providers.  She is receptive to a repeat corticosteroid injection for the left knee today to bridge her to that visit.  Procedure note for left knee injection is documented into our electronic record.  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  We are always happy to see her in the future as symptoms dictate.       Dictated By:  Debbe Odea, NP      ______________________________  Haze Boyden, MD    CLB/MODL  DD:  11/13/2016 09:01:02  DT:  11/13/2016 10:30:52  Job #:  [id number]    cc:

## 2016-12-09 ENCOUNTER — Encounter: Payer: Self-pay | Admitting: Physical Medicine and Rehabilitation

## 2016-12-09 ENCOUNTER — Ambulatory Visit
Payer: No Typology Code available for payment source | Attending: Physical Medicine and Rehabilitation | Admitting: Physical Medicine and Rehabilitation

## 2016-12-09 VITALS — BP 130/78 | Ht 65.0 in | Wt 225.0 lb

## 2016-12-09 DIAGNOSIS — M1712 Unilateral primary osteoarthritis, left knee: Secondary | ICD-10-CM

## 2016-12-09 NOTE — Patient Instructions (Signed)
Call [facility] [phone] tomorrow to schedule any of the following injections.  You can also call your insurance and provide them the following codes to determine your cost.    Prior authorization, billing and scheduling can take up to 2-4 weeks.      Monovisc x1  [phone] + [phone] Ultrasound Fee)   *Need to go thru Pre-Verification Billing Department first*- Cannot be given same day

## 2016-12-09 NOTE — Progress Notes (Signed)
Chief complaint:  Knee Pain    History of Present Illness:  This is a 61 y.o. female who presents with left knee pain.  Pain has been present for years.  Underwent knee arthroscopy in 2012.  The patient was referred by Dr Marla Roe and Elesa Hacker.     The pain is located mostly at the medial anterior knee and can radiate throughout the knee.  Reports the pain to be sharp or achy.  Feels her knee cap moves around.  Pain is made worse walking on uneven surfaces, hiking, kneeling, squatting, doing yard work.    Reports swelling.  No locking.  Reports instability.  Reports nonpainful popping.    Works as a Software engineer- sits all day long.    Specific functional goals of viscosupplementation:  -be able to walk longer distances  -be able to do yard work     Treatment History:    Physical therapy:   Yes [x]  No []  Helpful [x]  Somewhat Helpful []  Not helpful []     Exercise: exercise bike, resistance exercises  Yes [x]  No []  Helpful []  Somewhat Helpful [x]  Not helpful []     Activity modification:   Yes [x]  No []  Helpful []  Somewhat Helpful [x]  Not helpful []     Acetaminophen:   Yes [x]  No []  Helpful []  Somewhat Helpful [x]  Not helpful []     NSAIDs: advil - can cause GI upset   Yes [x]  No []  Helpful []  Somewhat Helpful [x]  Not helpful []     Corticosteroid injections:  Helpful 8-9 weeks  Yes [x]  No []  Helpful []  Somewhat Helpful [x]  Not helpful []     Viscosupplementation:  Orthovisc years ago  Yes [x]  No []  Helpful []  Somewhat Helpful []  Not helpful [x]     Surgery:  2012 left knee arthroscopy  Yes [x]  No []  Helpful []  Somewhat Helpful [x]  Not helpful []     Past Medical History:  Past Medical History:   Diagnosis Date    Abdominal pain 07/19/2012    Anxiety 3/12    Benign neoplasm of colon     Calcaneal bursitis 01/14/2012    right    Chronic cholecystitis 06/6107    Complication of anesthesia     PONV    Depression     Diverticulosis 11/27/2013    mild    Internal hemorrhoids     Migraine     q2-3 months     Mitral valve prolapse     OA (osteoarthritis)     left knee    Osteoarthritis, knee 12/03/2010    Rosacea 04/05/2003          Ruptured cerebral aneurysm     age 66    Sleep apnea     does not use CPAP    Syncope     d/t migraines    Urinary tract infection     frequent  3 this year    Vertigo        Past Surgical History:  Past Surgical History:   Procedure Laterality Date    CESAREAN SECTION, CLASSIC  '86 '88    spinal    CHOLECYSTECTOMY, LAPAROSCOPIC  11/16/2012    COLONOSCOPY  2012    COLONOSCOPY  11/24/2013    10/2018    KNEE ARTHROSCOPY Left 06/19/11    strabismus correction Right 1972, 1993    x2--child + 89 yo    [place]    4 wisdom and B/L bicuspids  Social History:  Social History     Social History    Marital status: Married     Spouse name: N/A    Number of children: N/A    Years of education: N/A     Social History Main Topics    Smoking status: Never Smoker    Smokeless tobacco: Never Used    Alcohol use Yes     1 Glasses of wine per week      Comment: 2 drinks/month    Drug use: No    Sexual activity: Yes     Partners: Male     Birth control/ protection: None     Other Topics Concern    None     Social History Narrative    She is employed as a Software engineer. She is married and lives with her husband Herbie [place]).  They have 2 children.  She exercises using a recumbent bike.  She enjoys golfing.        Family History:  Family History   Problem Relation Age of Onset    Breast cancer Mother     Depression Mother     Stroke Father     Heart failure Father     Heart Disease Father     Breast cancer Sister     Asthma Sister     Ovarian cancer Maternal Grandmother     Colon cancer Maternal Grandfather     Depression Sister     Depression Brother     Depression Daughter     Stroke Paternal Grandmother     Stroke Paternal Grandfather     Substance abuse Sister      Drug - past    Substance abuse Brother      Alcohol - current       Review of Systems:  A  complete 12 point review is negative except for the following:  Knee pain   Anxiety  Depression    Current Medications:  Current Outpatient Prescriptions on File Prior to Visit   Medication Sig Dispense Refill    buPROPion (WELLBUTRIN XL) 150 MG 24 hr tablet TAKE 1 TABLET BY MOUTH ONCE DAILY IN THE MORNING. SWALLOW WHOLE. DO NOT BREAK, CRUSH, OR CHEW. 30 tablet 2    rizatriptan (MAXALT) 5 MG tablet Take 1 tablet (5 mg total) by mouth as needed for Migraine   May repeat in 2 hours if needed.  Max 6 tabs/day. 10 tablet 3    zaleplon (SONATA) 5 MG capsule Take 1 capsule (5 mg total) by mouth nightly as needed for Sleep   Max daily dose: 5 mg 30 capsule 1    ranitidine (ZANTAC) 150 MG tablet Take 1 tablet (150 mg total) by mouth nightly (Patient taking differently: Take 150 mg by mouth 2 times daily as needed   ) 30 tablet 3    ALPRAZolam (XANAX) 0.5 MG tablet Take 1/2  to 1 tablet every 8 hours as needed--beware of sedation 30 tablet 0    Calcium 500 MG tablet Take 1 tablet by mouth daily         Cholecalciferol (VITAMIN D3) 1000 UNIT tablet Take 1,000 Units by mouth every morning         rizatriptan (MAXALT) 10 MG tablet Take 10 mg by mouth as needed   TAKE 1 TABLET AT ONSET OF HEADACHE. MAY REPEAT EVERY 2 HOURS AS NEEDED. MAXIMUM 3 TABLETS IN 24 HOURS.      Ibuprofen (ADVIL PO) Take 600 mg  by mouth every 6 hours as needed         doxycycline (VIBRA-TABS) 100 MG tablet Take 1 tablet (100 mg total) by mouth 2 times daily 14 tablet 0    albuterol HFA 108 (90 BASE) MCG/ACT inhaler Inhale 1-2 puffs into the lungs every 6 hours as needed for Wheezing   Shake well before each use. 1 Inhaler 0     No current facility-administered medications on file prior to visit.        Allergies:  Allergies   Allergen Reactions    Codeine Nausea And Vomiting    Gabapentin Other (See Comments)     Loopy      Lexapro [Escitalopram] Other (See Comments)     Fatigue    Wellbutrin [Bupropion] Other (See Comments)     Hair loss        Physical Exam:  Pain    12/09/16 1257   PainSc:   1     Blood pressure 130/78, height 1.651 m (5\' 5" ), weight 102.1 kg (225 lb).  Body mass index is 37.44 kg/(m^2).  Taken by patient care tech    General: NAD , pleasant and cooperative  HEENT: MMM, no scleral icterus  CVS: good perfusion   Pulm: normal work of breathing   Skin: no rashes   Pysch: normal affect    MSK/neuro:   Knees appear symmetrical without any lesions, erythema or deformity.  No warmth.   +moderate left sided effusion.   Left knee:  + crepitus with passive range of motion.  no pain to palpation around borders of patella. + medial joint line tenderness.  no lateral joint line tenderness.  no quad tendon tenderness.  no patellar tendon tenderness.  Range of motion 5-120. Pain with end range flexion.  Neurovascularly intact.   No ligamentous laxity with negative lachmans test, anterior drawer, posterior drawer.  Stable to valgus and varus stress. No pain with circumduction maneuvers.  Full strength in bilateral lower extremities.  Gait steady and independent.     Assessment:  This is a 61 y.o. female who presents with left knee pain.  -Left knee osteoarthritis- advanced    Plan:  Imaging reviewed.    I discussed the following treatment options:   Activity modifications discussed and recommended.   Discussed weight loss or maintenance of proper weight through diet and exercise.  Ice/heat to knee.  Medication options discussed and recommended - encouraged tylenol and NSAIDs prn as able.  Discussed injections including corticosteroid injections, orthovisc and monovisc injections.      Patient has failed to respond to conservative, non-pharmacologic therapy (e.g., physical therapy, exercise or activity modification); simple analgesics (e.g. Acetaminophen); is intolerant of or has contraindications to non-steroidal anti-inflammatory drugs (NSAIDs), is unable to be maintained on NSAIDs, and/or has failed to respond to NSAIDs; has failed to respond  to, has contraindications to, or refuses steroid injections; and pain duration has been for at least 6 months.  Therefore patient is a good candidate for viscosupplementation.    Continue with existing conservative treatment program.   Questions solicited and answered.   Patient requested to call my office for prior authorization of monovisc for the left knee.    Thank you for allowing [place] to take part in this patient's care and contact us if you have any questions.    Inda Castle, MD

## 2017-01-05 ENCOUNTER — Ambulatory Visit: Payer: No Typology Code available for payment source | Admitting: Physical Medicine and Rehabilitation

## 2017-01-27 ENCOUNTER — Encounter: Payer: Self-pay | Admitting: Physical Medicine and Rehabilitation

## 2017-01-27 ENCOUNTER — Ambulatory Visit
Payer: No Typology Code available for payment source | Attending: Physical Medicine and Rehabilitation | Admitting: Physical Medicine and Rehabilitation

## 2017-01-27 VITALS — BP 121/58 | HR 73 | Ht 65.0 in | Wt 225.0 lb

## 2017-01-27 DIAGNOSIS — M1712 Unilateral primary osteoarthritis, left knee: Secondary | ICD-10-CM

## 2017-01-27 MED ORDER — LIDOCAINE HCL 1 % IJ SOLN *I*
2.0000 mL | Freq: Once | INTRAMUSCULAR | Status: AC | PRN
Start: 2017-01-27 — End: 2017-01-27
  Administered 2017-01-27: 2 mL via INTRA_ARTICULAR

## 2017-01-27 MED ORDER — HYALURONAN 88 MG/4ML IX SOSY *I*
88.0000 mg | PREFILLED_SYRINGE | Freq: Once | INTRA_ARTICULAR | Status: AC | PRN
Start: 2017-01-27 — End: 2017-01-27
  Administered 2017-01-27: 88 mg via INTRA_ARTICULAR

## 2017-01-27 MED ORDER — LIDOCAINE HCL 1 % IJ SOLN *I*
1.0000 mL | Freq: Once | INTRAMUSCULAR | Status: AC | PRN
Start: 2017-01-27 — End: 2017-01-27
  Administered 2017-01-27: 1 mL via INTRA_ARTICULAR

## 2017-01-27 NOTE — Procedures (Signed)
Large Joint Aspiration/Injection Procedure    Date/Time: 01/27/2017 4:32 PM  Consent given by: patient  Site marked: site marked  Timeout: Immediately prior to procedure a time out was called to verify the correct patient, procedure, equipment, support staff and site/side marked as required     Procedure Details    Location: knee - L knee intra - articular  Preparation: The site was prepped using the usual aseptic technique.  Ultrasound guidance:  Ultrasound was utilized to improve needle visualization, injection accuracy, and anatomic localization.    Anesthetics administered: 2 mL lidocaine 1 %; 1 mL lidocaine 1 %  Viscosupplementation & other medications administered: 88 mg hyaluronate 88 MG/4ML  Dressing:  A dry, sterile dressing was applied.  Patient tolerance: patient tolerated the procedure well with no immediate complications      Patient Name: [name]  MRN: [id number]  Date: 01/27/2017  Procedure : Ultrasound guided injection  Laterality: Left  Body part: Knee  Examiner : Inda Castle, MD  Equipment : [facility] [place].   CPT Code : [phone]  Indication : Reduction of procedural pain    Technique: The patient is placed in a supine position.  A bolster is placed behind the knee.  The site was appropriately marked, and a time-out was performed prior to the procedure.  The skin is prepped with chlorhexadine in the usual fashion. Linear transducer is prepped with chlorhexadine and utilized to visualize the anterior knee.  Linear transducer was used to visualize the joint via a lateral window with sterile ultrasound gel. Ultrasound imagery of the injection site in long and short axes were obtained with the [facility] and appropriated images were labeled, saved and permanently archived.    Findings : A limited diagnostic scan of the joint was done. Small fluid collection was seen. No disruption of tendons.     Procedure: Risks, benefits, and alternatives to injection were discussed with  the patient.  [name] specifically acknowledges the possibility of pain, bleeding, infection, color change of skin, systemic reaction and lack of clinical improvement.  Verbal informed consent obtained.  Pre-Procedure Check was done:  Correct patient- Correct procedure - Ultrasound. Correct site marked. Correct patient position..  The injection is done on the left.  Local anesthetic with 1% lidocine 5 mL was given.  Using ultrasound guidance a 20 gauge 1.5 inch needle was inserted through the skin and into the suprapatellar pouch via a superior-lateral portal with avoidance of bony prominences, blood vessels and other vulnerable structures.  Needle tip joint space verification was done with the ultrasound then 1 ampule of Monovisc was injected, slowly but with consistent pressure, with free flow of fluid into the joint visualized.  Joint distention visualized.  The needle is removed, skin is wiped with alcohol, hemostasis is achieved and a bandage is applied.  The patient tolerated the procedure well without difficulty.  Warning signs and routine aftercare are discussed with the patient.      Impression:  Ultrasound Real time guidance Left Knee Intra-Articular Injection for Left Knee osteoarthritis.    Inda Castle, MD

## 2017-01-28 ENCOUNTER — Other Ambulatory Visit: Payer: Self-pay | Admitting: Primary Care

## 2017-03-22 ENCOUNTER — Other Ambulatory Visit: Payer: Self-pay | Admitting: Primary Care

## 2017-03-22 ENCOUNTER — Telehealth: Payer: Self-pay | Admitting: Primary Care

## 2017-03-22 NOTE — Telephone Encounter (Signed)
She is due for follow-up with me in the next several weeks mood follow-up etc.

## 2017-03-22 NOTE — Telephone Encounter (Signed)
Last visit 09/2016

## 2017-03-22 NOTE — Telephone Encounter (Signed)
Spoke with Malachy Mood:  Scheduled appointment Wednesday, October 31 at 7:20 am.

## 2017-04-18 ENCOUNTER — Ambulatory Visit
Admit: 2017-04-18 | Discharge: 2017-04-18 | Disposition: A | Discharge status: Home / Self Care | Payer: No Typology Code available for payment source

## 2017-04-18 ENCOUNTER — Ambulatory Visit
Admission: AD | Admit: 2017-04-18 | Discharge: 2017-04-18 | Disposition: A | Discharge status: Home / Self Care | Payer: No Typology Code available for payment source | Source: Ambulatory Visit | Attending: Family Medicine | Admitting: Family Medicine

## 2017-04-18 DIAGNOSIS — M1711 Unilateral primary osteoarthritis, right knee: Secondary | ICD-10-CM | POA: Insufficient documentation

## 2017-04-18 DIAGNOSIS — S8391XA Sprain of unspecified site of right knee, initial encounter: Secondary | ICD-10-CM | POA: Insufficient documentation

## 2017-04-18 NOTE — ED Triage Notes (Signed)
Pt states  R knee gave out while walking 5 days ago, states last night developed increased pain and inability to straighten entire leg. Painful to amb. Last dose Advil 600mg  at 0730 c pain relief       Triage Note   Chuong Casebeer, RN

## 2017-04-18 NOTE — Discharge Instructions (Signed)
[name] you were seen at urgent care for right knee pain. You were treated for right knee.    Your xray  did not show any acute fracture or dislocation of the right knee.    Please keep the right knee in knee sleeve at all times unless sleeping or showering x 1 week.    Continue to apply ice to right knee, 10 minutes on-10 minutes off, for pain/inflammation/swelling relief.    Please use crutches for touch toe weightbearing only.    For pain/inflammation relief, please continue to take 400-600 mg of ibuprofen every 6-8 hours, accompanied with ice application to affected area.    Elevate your right lower leg when seated or when lying down at night.    You need to rest your right kneefrom exertional/exacerbating activities for one week at minimum.    Please call your orthopedic doctor on Monday to set up an appointment to be seen as soon as possible.    Thank you [name] for choosing [facility] urgent care for your health concerns.    If your condition changes and/or worsens, please follow up with your primary care provider or return to [facility] urgent care for further evaluation.    If short of breath, chest pains or any other concerns please report to the emergency room.    In the event of an Emergency dial 911.

## 2017-04-18 NOTE — UC Provider Note (Signed)
History     Chief Complaint   Patient presents with    Knee Pain     HPI Comments: Patient is a 61 year old female with pertinent PMH of osteoarthritis in the left knee, presents with right knee pain for the last week.  Patient reports she was walking when she felt her knee buckle a week ago, had some pain along the lateral knee, has been icing and taking ibuprofen with mild relief.  Patient reports pain is been worsening overnight, is having a lot of pain with full extension of the right knee and when she walks downstairs and with weightbearing.  Patient denies fever, chills, recent fall or injury to right knee, numbness or tingling into right foot, pain or swelling to the right calf, pain in right ankle and right hip, chest pain, palpitations, abdominal pain, nausea/vomiting, diarrhea.  Patient denies history of right knee fracture, surgery.      History provided by:  Patient  Language interpreter used: No        Medical/Surgical/Family History     Past Medical History:   Diagnosis Date    Abdominal pain 07/19/2012    Anxiety 3/12    Benign neoplasm of colon     Calcaneal bursitis 01/14/2012    right    Chronic cholecystitis 07/5850    Complication of anesthesia     PONV    Depression     Diverticulosis 11/27/2013    mild    Internal hemorrhoids     Migraine     q2-3 months    Mitral valve prolapse     OA (osteoarthritis)     left knee    Osteoarthritis, knee 12/03/2010    Rosacea 04/05/2003          Ruptured cerebral aneurysm     age 37    Sleep apnea     does not use CPAP    Syncope     d/t migraines    Urinary tract infection     frequent  3 this year    Vertigo         Patient Active Problem List   Diagnosis Code    Overweight E66.3    Migraine headache G43.909    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    Sleep apnea G47.30    Vitamin D deficiency E55.9    Depression F32.9    Tear of medial cartilage or meniscus of knee, current IMO0002    Primary osteoarthritis of left knee  M17.12            Past Surgical History:   Procedure Laterality Date    CESAREAN SECTION, CLASSIC  '86 '88    spinal    CHOLECYSTECTOMY, LAPAROSCOPIC  11/16/2012    COLONOSCOPY  2012    COLONOSCOPY  11/24/2013    10/2018    KNEE ARTHROSCOPY Left 06/19/11    strabismus correction Right 1972, 1993    x2--child + 61 yo    [place]    4 wisdom and B/L bicuspids     Family History   Problem Relation Age of Onset    Breast cancer Mother     Depression Mother     Stroke Father     Heart failure Father     Heart Disease Father     Breast cancer Sister     Asthma Sister     Ovarian cancer Maternal Grandmother     Colon cancer Maternal Grandfather  Depression Sister     Depression Brother     Depression Daughter     Stroke Paternal Grandmother     Stroke Paternal Grandfather     Substance abuse Sister      Drug - past    Substance abuse Brother      Alcohol - current          Social History   Substance Use Topics    Smoking status: Never Smoker    Smokeless tobacco: Never Used    Alcohol use Yes     1 Glasses of wine per week      Comment: 2 drinks/month     Living Situation     Questions Responses    Patient lives with Spouse    Homeless     Caregiver for other family member     External Services     Employment     Domestic Violence Risk                 Review of Systems   Review of Systems   Constitutional: Negative for chills and fever.   HENT: Negative for congestion.    Respiratory: Negative for cough and shortness of breath.    Cardiovascular: Negative for chest pain, palpitations and leg swelling.   Gastrointestinal: Negative for abdominal pain, diarrhea, nausea and vomiting.   Genitourinary: Negative for difficulty urinating.   Musculoskeletal: Positive for arthralgias (right knee). Negative for gait problem, joint swelling and myalgias.   Skin: Negative for color change and wound.   Neurological: Negative for dizziness, light-headedness, numbness (no tingling into right  foot) and headaches.   Hematological: Negative for adenopathy.   Psychiatric/Behavioral: Negative for agitation, behavioral problems and confusion.       Physical Exam   Triage Vitals  Triage Start: Start, (04/18/17 4540)   First Recorded BP: 141/61, Resp: 18, Temp: 36.9 C (98.4 F), Temp src: TEMPORAL Oxygen Therapy SpO2: 98 %, Oximetry Source: Rt Hand, O2 Device: None (Room air), Heart Rate: 77, (04/18/17 0944)  .  First Pain Reported  0-10 Scale: 9, Pain Location/Orientation: Knee Right, (04/18/17 0944)       Physical Exam   Constitutional: She is oriented to person, place, and time. Vital signs are normal. She appears well-developed and well-nourished.  Non-toxic appearance. She does not have a sickly appearance. She does not appear ill. No distress.   HENT:   Head: Normocephalic and atraumatic.   Right Ear: External ear normal.   Left Ear: External ear normal.   Mouth/Throat: Oropharynx is clear and moist.   Neck: Normal range of motion.   Cardiovascular: Normal rate, regular rhythm, normal heart sounds and intact distal pulses.    Pulmonary/Chest: Effort normal and breath sounds normal. No respiratory distress. She has no wheezes.   Musculoskeletal:        Right knee: She exhibits decreased range of motion and bony tenderness. She exhibits no swelling, no effusion, no ecchymosis, no deformity, no laceration, no erythema, normal alignment, no LCL laxity, normal patellar mobility, normal meniscus and no MCL laxity. Tenderness found. Lateral joint line and LCL tenderness noted. No medial joint line, no MCL and no patellar tendon tenderness noted.   Patient exhibited for questions against resistance in right lower extremity, 5 strength against resistance in left lower extremity.  Sensation to soft touch intact bilaterally.  Patient was able to fully extend at the right knee but with difficulty due to pain.  No deformity or swelling noted.  Neurological: She is alert and oriented to person, place, and time. She  has normal strength. No sensory deficit.   Skin: She is not diaphoretic.   Psychiatric: She has a normal mood and affect. Her behavior is normal. Judgment and thought content normal.   Nursing note and vitals reviewed.       Medical Decision Making      Amount and/or Complexity of Data Reviewed  Tests in the radiology section of CPT: ordered and reviewed  Review and summarize past medical records: yes  Independent visualization of images, tracings, or specimens: yes        Initial Evaluation:  ED First Provider Contact     Date/Time Event User Comments    04/18/17 [phone] ED First Provider Contact Niel Hummer Initial Face to Face Provider Contact          Patient was seen on: 04/18/2017        Assessment:  61 y.o.female comes to the [facility] with 1 week right knee pain without any known injury.  Patient was alert and oriented, vital signs reviewed and were stable, patient did not appear toxic.    Differential Diagnosis includes:  Meniscal injury  Contusion  Tibial Fracture  Ligamentous sprain  Tendon Strain  Osteoarthritis    Plan:     Orders Placed This Encounter    ED/UC Knee Sleeve - Open Patella    Crutches    * Knee RIGHT standard AP, Lateral, Patellar views    Please Provide Supply: Knee Sleeve     Orders Placed This Encounter   Procedures    ED/UC Knee Sleeve - Open Patella     Standing Status:   Standing     Number of Occurrences:   1    Crutches     Standing Status:   Standing     Number of Occurrences:   1    * Knee RIGHT standard AP, Lateral, Patellar views     Standing Status:   Standing     Number of Occurrences:   1     Order Specific Question:   Where should test be performed?     Answer:   [facility]     Order Specific Question:   Signs and symptoms/indications     Answer:   right lateral knee pain, buckled while ambulating, difficulty with extending right knee, pain with bearing weight     Order Specific Question:   Patient pregnant     Answer:   Unknown    Please Provide  Supply: Knee Sleeve     Standing Status:   Standing     Number of Occurrences:   1     Order Specific Question:   Please Provide Supply:     Answer:   Knee Sleeve       * Knee Right Standard Ap, Lateral, Patellar Views    Result Date: 04/18/2017  04/18/2017 10:31 AM  KNEE RT 3 VIEWS COMPLETE ORDERING CLINICAL INFORMATION:   Right Lateral Knee Pain, Buckled While Ambulating, Difficulty With Extending Right Knee, Pain With Bearing Weight ;  COMPARISON: None. FINDINGS AND IMPRESSION:  AP, lateral, and patellar views of the right knee are obtained. No acute fracture or dislocation. Tricompartmental degenerative changes of the knee, as evidenced by joint space loss and osteophytosis, most prominently involving the patellofemoral compartment. No significant knee joint effusion. END REPORT [facility] submits this DICOM format image data and final report to the [facility], an independent  secure electronic health information exchange, on a reciprocally searchable basis (with patient authorization) for a minimum of 12 months after exam date.     Final Diagnosis    ICD-10-CM ICD-9-CM   1. Sprain of right knee, unspecified ligament, initial encounter S83.91XA 844.9       Encourage fluids, encourage rest, good hand hygiene.    Use over the counter medications as discussed.     Discharge Instructions    References/Attachments: Go to References/Attachments    CRYOTHERAPY, EASY-TO-READ (ENGLISH) View    KNEE SPRAIN, EASY-TO-READ (ENGLISH) View    Patient's Language: English   Instructions      Layali Freund you were seen at urgent care for right knee pain. You were treated for right knee.    Your xray  did not show any acute fracture or dislocation of the right knee.    Please keep the right knee in knee sleeve at all times unless sleeping or showering x 1 week.    Continue to apply ice to right knee, 10 minutes on-10 minutes off, for pain/inflammation/swelling relief.    Please use crutches for touch toe  weightbearing only.    For pain/inflammation relief, please continue to take 400-600 mg of ibuprofen every 6-8 hours, accompanied with ice application to affected area.    Elevate your right lower leg when seated or when lying down at night.    You need to rest your right kneefrom exertional/exacerbating activities for one week at minimum.    Please call your orthopedic doctor on Monday to set up an appointment to be seen as soon as possible.    Thank you [name] for choosing [facility] for your health concerns.    If your condition changes and/or worsens, please follow up with your primary care provider or return to [facility] for further evaluation.    If short of breath, chest pains or any other concerns please report to the emergency room.    In the event of an Emergency dial 911.              Please follow up with your physician as below:    Follow-up Information     Follow up with Iacobucci, Maudry Mayhew, MD In 1 week.    Specialties:  Primary Care, Internal Medicine    Why:  If symptoms worsen    Contact information:    [place] [place]  [facility] [address]  [place] [address]  [phone]     Final Diagnosis  Final diagnoses:   None     Signed by:  Niel Hummer, NP  [facility]  Department of Emergency Medicine  04/18/2017 11:06 AM    Collaborating physician Philippa Chester, MD was immediately available          Niel Hummer, NP  04/18/17 1107

## 2017-04-20 ENCOUNTER — Ambulatory Visit: Payer: No Typology Code available for payment source | Attending: Orthopedic Surgery | Admitting: Orthopedic Surgery

## 2017-04-20 ENCOUNTER — Encounter: Payer: Self-pay | Admitting: Orthopedic Surgery

## 2017-04-20 VITALS — BP 130/63 | HR 122 | Ht 65.0 in | Wt 225.0 lb

## 2017-04-20 DIAGNOSIS — M1711 Unilateral primary osteoarthritis, right knee: Secondary | ICD-10-CM

## 2017-04-20 MED ORDER — BETAMETHASONE ACET & SOD PHOS 6 (3-3) MG/ML IJ SUSP *I*
6.0000 mg | Freq: Once | INTRAMUSCULAR | Status: AC | PRN
Start: 2017-04-20 — End: 2017-04-20
  Administered 2017-04-20: 6 mg via INTRA_ARTICULAR

## 2017-04-20 MED ORDER — LIDOCAINE HCL 1 % IJ SOLN *I*
5.0000 mL | Freq: Once | INTRAMUSCULAR | Status: AC | PRN
Start: 2017-04-20 — End: 2017-04-20
  Administered 2017-04-20: 13:00:00 5 mL via INTRA_ARTICULAR

## 2017-04-20 NOTE — Procedures (Signed)
Large Joint Aspiration/Injection Procedure    Date/Time: 04/20/2017 1:29 PM  Consent given by: patient  Site marked: site marked  Timeout: Immediately prior to procedure a time out was called to verify the correct patient, procedure, equipment, support staff and site/side marked as required     Procedure Details    Location: knee - R knee intra - articular  Preparation: The site was prepped using the usual aseptic technique.  Anesthetics administered: 5 mL lidocaine hcl 1 %  Intra-Articular Steroids administered: 6 mg betamethasone acetate & sodium phosphate 6 (3-3) MG/ML  Dressing:  A dry, sterile dressing was applied.  Patient tolerance: patient tolerated the procedure well with no immediate complications

## 2017-04-20 NOTE — Progress Notes (Signed)
Chief Complaint: Right knee pain      HPI: Patient presents today for evaluation of right knee.  The contents/centimeters/18 she was simply walking at work when the knee buckled on her.  She sat down, rested, was limping that day due to discomfort.  She gave it a couple of days and in no known as much discomfort.  Then on Saturday 04/17/17 she performed a lot of errands that day, she woke up following morning with increased pain and difficulty bearing weight.  She denies any specific injuries.  She followed up with urgent care that morning for evaluation, they obtained x-rays demonstrating no acute findings, they did show osteoarthritis.  Advised follow up with orthopedics.  She denies any numbness or tingling.  She denies painful clicking, locking or popping.  She does admit to having severe arthritis in the left knee she is being being treated by Dr. Eddie Dibbles for this receiving Orthovisc injections.       ROS: 10 + Review of systems per patient questionnaire was reviewed and confirmed. Pertinent positives listed in HPI, all other systems negative.     Medications, Family history and allergies reviewed, per patient question, see chart listing.      Past medical history: Arthritis      Past surgical history: See chart listing      Social History: She is employed as a Software engineer.      Physical Exam:  Constitutional: 61 year old female, alert, oriented, no acute distress.  Musculoskeletal: She presents ambulating with crutches for assistance.  Evaluation of the right knee reveals skin intact, no ecchymosis, swelling, deformity.  He was trace palpable effusion.  Tenderness along the medial and lateral joint line.  Tenderness along the posterior tibial fossa.  Nontender over her quad tendon, patella, patellar tendon.  Range of motion is 0-120.  Stable to valgus and varus stress.  Stable to Lachman and drawer testing.  Negative McMurray.  Flexor and extensor mechanisms are intact.  She performs a straight leg raise.   Calf is soft and nontender.      Imaging: Radiographs of the right knee were obtained on 04/18/17 and demonstrating no acute osseous soft tissue abnormalities, medial patellofemoral joint space narrowing with marginal tricompartmental osteophytosis consistent with degenerative changes.      Assessment/Diagnosis: Right knee osteoarthritis, aggravated      Plan: All findings are presented to the patient.  I recommended conservative treatment in the form of activity activity modification, rest, ice, elevation, by mouth NSAIDs and/or Tylenol.  She will utilize crutches or cane for assistance.  I offered her a steroid injection today which she wished to proceed with.  She tolerated this injection well.  She'll follow up in 4 weeks for reassessment, sooner with any persistent or worsening symptoms.          Orders Today: Steroid injection    Orders placed for next visit: none    Supervising Physician: Dr. Evelena Asa

## 2017-04-21 ENCOUNTER — Telehealth: Payer: Self-pay

## 2017-04-21 NOTE — Telephone Encounter (Signed)
[facility] ED/UC Post-Visit Patient Call     Visit location: [place]    Date of service: 04/18/17    Condition of patient: n/a    Patient seeking follow-up with PCP or specialist: n/a    Prescriptions filled: n/a    What did you like best about your visit?: n/a    What would you have liked [place] to do better?: n/a    Would you consider returning as a patient to this site?: n/a    Other questions/concerns voiced by patient: patient call back attempted.  no answer.    Rufino Staup on 04/21/2017 at 10:40 AM

## 2017-04-27 ENCOUNTER — Ambulatory Visit
Payer: No Typology Code available for payment source | Attending: Physical Medicine and Rehabilitation | Admitting: Physical Medicine and Rehabilitation

## 2017-04-27 ENCOUNTER — Encounter: Payer: Self-pay | Admitting: Physical Medicine and Rehabilitation

## 2017-04-27 VITALS — BP 132/62 | HR 95 | Ht 65.0 in | Wt 225.0 lb

## 2017-04-27 DIAGNOSIS — M170 Bilateral primary osteoarthritis of knee: Secondary | ICD-10-CM

## 2017-04-27 NOTE — Progress Notes (Signed)
Chief complaint:  Knee Pain    History of Present Illness:  This is a 61 y.o. female who presents with left knee pain.  Pain has been present for years.  Underwent knee arthroscopy in 2012.  Also here today with new problem: right knee pain.  The patient was referred by Dr Marla Roe and Elesa Hacker.     She reports her left knee pain is very much improved after monovisc injection done 3 months ago (50-60% better.)  Reports she is generally able to be more active.  Taking advil prn (only once weekly on average now).    She does report recent buckling of her right knee.  Was seen in urgent care.  Reports the pain has improved some since that time.  Did receive a corticosteroid injection.  The pain is located around the knee with associated tenderness.  Also with pain traveling up and down her right lateral thigh and right lateral shin.  Reports this pain is different from the arthritis pain in her left knee.      Works as a Software engineer- sits all day long.       Treatment History:    Physical therapy:   Yes [x]  No []  Helpful [x]  Somewhat Helpful []  Not helpful []     Exercise: exercise bike, resistance exercises  Yes [x]  No []  Helpful []  Somewhat Helpful [x]  Not helpful []     Activity modification:   Yes [x]  No []  Helpful []  Somewhat Helpful [x]  Not helpful []     Acetaminophen:   Yes [x]  No []  Helpful []  Somewhat Helpful [x]  Not helpful []     NSAIDs: advil - can cause GI upset   Yes [x]  No []  Helpful []  Somewhat Helpful [x]  Not helpful []     Corticosteroid injections:  Helpful 8-9 weeks  Yes [x]  No []  Helpful []  Somewhat Helpful [x]  Not helpful []     Viscosupplementation:  Monovisc left knee  Yes [x]  No []  Helpful [x]  Somewhat Helpful []  Not helpful []     Surgery:  2012 left knee arthroscopy  Yes [x]  No []  Helpful []  Somewhat Helpful [x]  Not helpful []     Past Medical History:  Past Medical History:   Diagnosis Date    Abdominal pain 07/19/2012    Anxiety 3/12    Benign neoplasm of colon     Calcaneal  bursitis 01/14/2012    right    Chronic cholecystitis 07/5364    Complication of anesthesia     PONV    Depression     Diverticulosis 11/27/2013    mild    Internal hemorrhoids     Migraine     q2-3 months    Mitral valve prolapse     OA (osteoarthritis)     left knee    Osteoarthritis, knee 12/03/2010    Rosacea 04/05/2003          Ruptured cerebral aneurysm     age 40    Sleep apnea     does not use CPAP    Syncope     d/t migraines    Urinary tract infection     frequent  3 this year    Vertigo        Past Surgical History:  Past Surgical History:   Procedure Laterality Date    CESAREAN SECTION, CLASSIC  '86 '88    spinal    CHOLECYSTECTOMY, LAPAROSCOPIC  11/16/2012    COLONOSCOPY  2012    COLONOSCOPY  11/24/2013  10/2018    KNEE ARTHROSCOPY Left 06/19/11    strabismus correction Right 1972, 1993    x2--child + 42 yo    [place]    4 wisdom and B/L bicuspids       Social History:  Social History     Social History    Marital status: Married     Spouse name: N/A    Number of children: N/A    Years of education: N/A     Social History Main Topics    Smoking status: Never Smoker    Smokeless tobacco: Never Used    Alcohol use Yes     1 Glasses of wine per week      Comment: 2 drinks/month    Drug use: No    Sexual activity: Yes     Partners: Male     Birth control/ protection: None     Other Topics Concern    None     Social History Narrative    She is employed as a Software engineer. She is married and lives with her husband Herbie [place]).  They have 2 children.  She exercises using a recumbent bike.  She enjoys golfing.        Family History:  Family History   Problem Relation Age of Onset    Breast cancer Mother     Depression Mother     Stroke Father     Heart failure Father     Heart Disease Father     Breast cancer Sister     Asthma Sister     Ovarian cancer Maternal Grandmother     Colon cancer Maternal Grandfather     Depression Sister     Depression Brother      Depression Daughter     Stroke Paternal Grandmother     Stroke Paternal Grandfather     Substance abuse Sister      Drug - past    Substance abuse Brother      Alcohol - current       Review of Systems:  A complete 12 point review is negative except for the following:  Knee pain       Current Medications:  Current Outpatient Prescriptions on File Prior to Visit   Medication Sig Dispense Refill    buPROPion (WELLBUTRIN XL) 150 MG 24 hr tablet TAKE 1 TABLET BY MOUTH ONCE DAILY IN THE MORNING. SWALLOW WHOLE. DO NOT BREAK, CRUSH, OR CHEW. 30 tablet 0    rizatriptan (MAXALT) 5 MG tablet Take 1 tablet (5 mg total) by mouth as needed for Migraine   May repeat in 2 hours if needed.  Max 6 tabs/day. 10 tablet 3    ranitidine (ZANTAC) 150 MG tablet Take 1 tablet (150 mg total) by mouth nightly (Patient taking differently: Take 150 mg by mouth 2 times daily as needed   ) 30 tablet 3    Calcium 500 MG tablet Take 1 tablet by mouth daily         Cholecalciferol (VITAMIN D3) 1000 UNIT tablet Take 1,000 Units by mouth every morning         rizatriptan (MAXALT) 10 MG tablet Take 10 mg by mouth as needed   TAKE 1 TABLET AT ONSET OF HEADACHE. MAY REPEAT EVERY 2 HOURS AS NEEDED. MAXIMUM 3 TABLETS IN 24 HOURS.      Ibuprofen (ADVIL PO) Take 600 mg by mouth every 6 hours as needed  No current facility-administered medications on file prior to visit.        Allergies:  Allergies   Allergen Reactions    Codeine Nausea And Vomiting    Gabapentin Other (See Comments)     Loopy      Lexapro [Escitalopram] Other (See Comments)     Fatigue    Wellbutrin [Bupropion] Other (See Comments)     Hair loss       Physical Exam:  Pain    04/27/17 1534   PainSc:   4   PainLoc: Knee     Blood pressure 132/62, pulse 95, height 1.651 m (5\' 5" ), weight 102.1 kg (225 lb).  Body mass index is 37.44 kg/(m^2).  Taken by patient care tech    General: NAD , pleasant and cooperative  HEENT: MMM, no scleral icterus  CVS: good perfusion    Pulm: normal work of breathing   Skin: no rashes   Pysch: normal affect    MSK/neuro:   Right knee without any lesions, erythema or deformity.  No warmth.   +small effusion.  Right knee:  + crepitus with passive range of motion.  + pain to palpation around borders of patella. + medial joint line tenderness.  + lateral joint line tenderness.  + quad tendon tenderness.  + patellar tendon tenderness.  Range of motion 0-120.   Neurovascularly intact.   No ligamentous laxity with negative lachmans test, anterior drawer, posterior drawer.  Stable to valgus and varus stress. +mild pain with circumduction maneuvers.  Gait steady and independent.     Assessment:  This is a 61 y.o. female who presents with bilateral knee pain.  -Left knee osteoarthritis- advanced  -Right knee osteoarthritis- moderate    Plan:  Left knee OA:  -Discussed can repeat monovisc q 6 months prn    Right knee OA:  -Encouraged low impact exercise as tolerates  -Ice knee  -NSAIDs prn  -Could consider monovisc if pain continues    Follow-up prn    Thank you for allowing [place] to take part in this patient's care and contact us if you have any questions.    Inda Castle, MD

## 2017-04-27 NOTE — Patient Instructions (Signed)
Call [facility] [phone] for gel shots.

## 2017-04-28 ENCOUNTER — Ambulatory Visit: Payer: No Typology Code available for payment source | Attending: Primary Care | Admitting: Primary Care

## 2017-04-28 VITALS — BP 132/78 | HR 72 | Wt 226.0 lb

## 2017-04-28 DIAGNOSIS — M1712 Unilateral primary osteoarthritis, left knee: Secondary | ICD-10-CM

## 2017-04-28 DIAGNOSIS — M1711 Unilateral primary osteoarthritis, right knee: Secondary | ICD-10-CM | POA: Insufficient documentation

## 2017-04-28 DIAGNOSIS — F32A Depression, unspecified: Secondary | ICD-10-CM

## 2017-04-28 DIAGNOSIS — E663 Overweight: Secondary | ICD-10-CM

## 2017-04-28 DIAGNOSIS — F329 Major depressive disorder, single episode, unspecified: Secondary | ICD-10-CM

## 2017-04-28 HISTORY — DX: Unilateral primary osteoarthritis, right knee: M17.11

## 2017-04-28 MED ORDER — BUPROPION HCL 150 MG PO TB24 *I*
150.0000 mg | ORAL_TABLET | Freq: Every morning | ORAL | 1 refills | Status: DC
Start: 2017-04-28 — End: 2017-11-16

## 2017-04-28 NOTE — Progress Notes (Signed)
CCf/u      HPI  B/ knee djd--saw ortho twice last 2 weeks--has had falls and last one was last winter slipping on ice--will rx handicap permit--had recent cortisone to  Right knee and may get monovisc in the future. Not taking advil regularly    Mood--okay other than lots of stress with mom of 95 who lives with alcoholic brother, no thoughts of hurting self or others.    Wt up 2 pounds otherwise okay, encouraged exercise as able with her knees and decrease calorie intake    Migraines--fine    Had flu shot at a pharmacy      Review of systems    Feeling fine  No chest pain  No shortness of breath  No cough  No change of appetite  No nausea  No vomiting  No abdominal pain  No stool changes  No urinary symptoms          Current Outpatient Prescriptions   Medication Sig Dispense Refill    buPROPion (WELLBUTRIN XL) 150 MG 24 hr tablet TAKE 1 TABLET BY MOUTH ONCE DAILY IN THE MORNING. SWALLOW WHOLE. DO NOT BREAK, CRUSH, OR CHEW. 30 tablet 0    rizatriptan (MAXALT) 5 MG tablet Take 1 tablet (5 mg total) by mouth as needed for Migraine   May repeat in 2 hours if needed.  Max 6 tabs/day. 10 tablet 3    ranitidine (ZANTAC) 150 MG tablet Take 1 tablet (150 mg total) by mouth nightly (Patient taking differently: Take 150 mg by mouth 2 times daily as needed   ) 30 tablet 3    Calcium 500 MG tablet Take 1 tablet by mouth daily         Cholecalciferol (VITAMIN D3) 1000 UNIT tablet Take 1,000 Units by mouth every morning         rizatriptan (MAXALT) 10 MG tablet Take 10 mg by mouth as needed   TAKE 1 TABLET AT ONSET OF HEADACHE. MAY REPEAT EVERY 2 HOURS AS NEEDED. MAXIMUM 3 TABLETS IN 24 HOURS.      Ibuprofen (ADVIL PO) Take 600 mg by mouth every 6 hours as needed          No current facility-administered medications for this visit.          Vitals:    04/28/17 0723   BP: 132/78   Pulse: 72   Weight: 102.5 kg (226 lb)         EXAM  Gen: patient appears comfortable and in no distress  Cv: RRR, Nl S1,S2  Resp: CTA b/l, no  rales, no rhonchi  Ext: no edema          A/P pleasant female seen here for follow-up of several issues    Depression doing well with bupropion this is a new per her request recommend follow-up in about 4-6 months no thoughts of hurting self or others    Migraine headaches manageable nonissue    Knee osteoarthritis followed by orthopedics managing okay at this time no acute concerns rarely takes ibuprofen.    Due to the bilateral knee osteoarthritis and falls on ice last year etc. will write for handicap Parking permit for the winter.  Thank you

## 2017-05-18 ENCOUNTER — Ambulatory Visit: Payer: No Typology Code available for payment source | Admitting: Orthopedic Surgery

## 2017-08-04 ENCOUNTER — Other Ambulatory Visit: Payer: Self-pay | Admitting: Gastroenterology

## 2017-09-07 ENCOUNTER — Encounter: Payer: Self-pay | Admitting: Physical Medicine and Rehabilitation

## 2017-11-03 ENCOUNTER — Ambulatory Visit
Payer: No Typology Code available for payment source | Attending: Physical Medicine and Rehabilitation | Admitting: Physical Medicine and Rehabilitation

## 2017-11-03 ENCOUNTER — Encounter: Payer: Self-pay | Admitting: Physical Medicine and Rehabilitation

## 2017-11-03 VITALS — BP 138/71 | HR 73 | Ht 65.0 in | Wt 226.0 lb

## 2017-11-03 DIAGNOSIS — M1712 Unilateral primary osteoarthritis, left knee: Secondary | ICD-10-CM

## 2017-11-03 MED ORDER — HYALURONAN 88 MG/4ML IX SOSY *I*
88.0000 mg | PREFILLED_SYRINGE | Freq: Once | INTRA_ARTICULAR | Status: AC | PRN
Start: 2017-11-03 — End: 2017-11-03
  Administered 2017-11-03: 88 mg via INTRA_ARTICULAR

## 2017-11-03 MED ORDER — LIDOCAINE HCL 1 % IJ SOLN *I*
5.0000 mL | Freq: Once | INTRAMUSCULAR | Status: AC | PRN
Start: 2017-11-03 — End: 2017-11-03
  Administered 2017-11-03: 08:00:00 5 mL via INTRA_ARTICULAR

## 2017-11-03 NOTE — Progress Notes (Signed)
Called patient and left her voicemail message that Vaughan Basta had left her 2 messages to call her to pay for Monovisc of her L/Knee before her appointment today.  Patient never called Vaughan Basta to pay and received injection today.  I advised on the voicemail that patient will be billed $1158.86 for Monovisc L/knee.

## 2017-11-03 NOTE — Procedures (Signed)
Large Joint Aspiration/Injection Procedure    Date/Time: 11/03/2017 8:17 AM  Consent given by: patient  Site marked: site marked  Timeout: Immediately prior to procedure a time out was called to verify the correct patient, procedure, equipment, support staff and site/side marked as required     Procedure Details    Location: knee - L knee intra - articular  Preparation: The site was prepped using the usual aseptic technique.  Ultrasound guidance:  Ultrasound was utilized to improve needle visualization, injection accuracy, and anatomic localization.    Anesthetics administered: 5 mL lidocaine hcl 1 %  Viscosupplementation & other medications administered: 88 mg hyaluronate 88 MG/4ML  Dressing:  A dry, sterile dressing was applied.  Patient tolerance: patient tolerated the procedure well with no immediate complications      Patient Name: [name]  MRN: [id number]  Date: 11/03/2017  Procedure : Ultrasound guided injection  Laterality: Left  Body part: Knee  Examiner : Inda Castle, MD  Equipment : [facility], [place].   CPT Code : [phone]  Indication : Reduction of procedural pain    Technique: The patient is placed in a supine position.  A bolster is placed behind the knee.  The site was appropriately marked, and a time-out was performed prior to the procedure.  The skin is prepped with chlorhexadine in the usual fashion. Linear transducer is prepped with chlorhexadine and utilized to visualize the anterior knee.  Linear transducer was used to visualize the joint via a lateral window with sterile ultrasound gel. Ultrasound imagery of the injection site in long and short axes were obtained with the [facility] and appropriated images were labeled, saved and permanently archived.    Findings : A limited diagnostic scan of the joint was done. Small-moderate fluid collection was seen. No disruption of tendons.     Procedure: Risks, benefits, and alternatives to injection were discussed with the  patient.  [name] specifically acknowledges the possibility of pain, bleeding, infection, color change of skin, systemic reaction and lack of clinical improvement.  Verbal informed consent obtained.  Pre-Procedure Check was done:  Correct patient- Correct procedure - Ultrasound. Correct site marked. Correct patient position..  The injection is done on the left.  Local anesthetic with 1% lidocine 5 mL was given.  Using ultrasound guidance a 20 gauge 1.5 inch needle was inserted through the skin and into the suprapatellar pouch via a superior-lateral portal with avoidance of bony prominences, blood vessels and other vulnerable structures.  Needle tip joint space verification was done with the ultrasound then 1 ampule of Monovisc was injected, slowly but with consistent pressure, with free flow of fluid into the joint visualized.  Joint distention visualized.  The needle is removed, skin is wiped with alcohol, hemostasis is achieved and a bandage is applied.  The patient tolerated the procedure well without difficulty.  Warning signs and routine aftercare are discussed with the patient.      Impression:  Ultrasound Real time guidance Left Knee Intra-Articular Injection for Left Knee osteoarthritis.    Inda Castle, MD

## 2017-11-10 ENCOUNTER — Other Ambulatory Visit
Admission: RE | Admit: 2017-11-10 | Discharge: 2017-11-10 | Disposition: A | Discharge status: Home / Self Care | Payer: No Typology Code available for payment source | Source: Ambulatory Visit | Attending: Obstetrics and Gynecology | Admitting: Obstetrics and Gynecology

## 2017-11-10 DIAGNOSIS — Z01419 Encounter for gynecological examination (general) (routine) without abnormal findings: Secondary | ICD-10-CM | POA: Insufficient documentation

## 2017-11-10 DIAGNOSIS — Z124 Encounter for screening for malignant neoplasm of cervix: Secondary | ICD-10-CM | POA: Insufficient documentation

## 2017-11-16 ENCOUNTER — Other Ambulatory Visit: Payer: Self-pay | Admitting: Primary Care

## 2017-11-16 ENCOUNTER — Telehealth: Payer: Self-pay | Admitting: Primary Care

## 2017-11-16 NOTE — Telephone Encounter (Signed)
Left message to call me.

## 2017-11-16 NOTE — Telephone Encounter (Signed)
Last office visit 04/28/2017

## 2017-11-16 NOTE — Telephone Encounter (Signed)
She is due for brief f/u with me

## 2017-11-17 LAB — GYN CYTOLOGY

## 2017-11-17 NOTE — Telephone Encounter (Signed)
Spoke with [name] Mood:  She scheduled visit for June 13 at 7:40.

## 2017-12-09 ENCOUNTER — Encounter: Payer: Self-pay | Admitting: Primary Care

## 2017-12-09 ENCOUNTER — Ambulatory Visit: Payer: No Typology Code available for payment source | Attending: Primary Care | Admitting: Primary Care

## 2017-12-09 VITALS — BP 120/78 | HR 84 | Wt 225.0 lb

## 2017-12-09 DIAGNOSIS — F329 Major depressive disorder, single episode, unspecified: Secondary | ICD-10-CM

## 2017-12-09 DIAGNOSIS — M1712 Unilateral primary osteoarthritis, left knee: Secondary | ICD-10-CM

## 2017-12-09 DIAGNOSIS — F32A Depression, unspecified: Secondary | ICD-10-CM

## 2017-12-09 NOTE — Progress Notes (Signed)
CC follow up mood and other issues      HPI  Mood well had a two-week trip to [place] and [place] had a good time doing fine on bupropion declines the need medication change no thoughts hurting self feels well some anxiety at work but managing okay declines the need for medication change    Bilateral knees gets injections doing okay overall occasionally will take Advil but not regularly    Weight stable encourage decrease calorie intake and regular exercise but aware limited by the osteoarthritis    Migraines have been doing fine          Review of systems    Feeling fine  No fever  No headache  No chest pain   No shortness of breath  No cough  No change of appetite  No nausea  No vomiting  No abdominal pain  No stool changes  No urinary symptoms  No dizziness        Current Outpatient Prescriptions   Medication Sig Dispense Refill    buPROPion (WELLBUTRIN XL) 150 MG 24 hr tablet Take 1 tablet (150 mg total) by mouth every morning   Swallow whole. Do not crush, break, or chew. 90 tablet 0    rizatriptan (MAXALT) 5 MG tablet Take 1 tablet (5 mg total) by mouth as needed for Migraine   May repeat in 2 hours if needed.  Max 6 tabs/day. 10 tablet 3    ranitidine (ZANTAC) 150 MG tablet Take 1 tablet (150 mg total) by mouth nightly (Patient taking differently: Take 150 mg by mouth 2 times daily as needed   ) 30 tablet 3    Calcium 500 MG tablet Take 1 tablet by mouth daily         Cholecalciferol (VITAMIN D3) 1000 UNIT tablet Take 1,000 Units by mouth every morning         Ibuprofen (ADVIL PO) Take 600 mg by mouth every 6 hours as needed          No current facility-administered medications for this visit.          Vitals:    12/09/17 0749   BP: 120/78   Pulse: 84   Weight: 102.1 kg (225 lb)         EXAM  Gen: patient appears comfortable and in no distress  Cv: RRR, Nl S1,S2  Resp: CTA b/l, no rales, no rhonchi, no wheezing no tachypnea   Ext: no edema          A/P pleasant female seen here for follow-up of mood  doing well had recent trip with family to [place] etc. doing well back to work no issues was briefly sick with an upper respiratory infection that has now cleared lungs were clear not exam declines the need for med change recommend full physical in the fall after October 17 as that will be 2 years denies any issues with mood at this time continue bupropion    Weight encourage decrease calorie intake and exercise as able    Thank you

## 2018-02-11 ENCOUNTER — Encounter: Payer: Self-pay | Admitting: Primary Care

## 2018-02-11 ENCOUNTER — Other Ambulatory Visit: Payer: Self-pay | Admitting: Primary Care

## 2018-02-11 NOTE — Telephone Encounter (Signed)
12/09/2017 last seen

## 2018-04-06 ENCOUNTER — Ambulatory Visit: Payer: No Typology Code available for payment source | Attending: Primary Care | Admitting: Primary Care

## 2018-04-06 VITALS — BP 118/82 | HR 70 | Temp 97.4°F | Resp 19 | Wt 231.0 lb

## 2018-04-06 DIAGNOSIS — N390 Urinary tract infection, site not specified: Secondary | ICD-10-CM | POA: Insufficient documentation

## 2018-04-06 LAB — POCT URINALYSIS DIPSTICK
Glucose,UA POCT: NORMAL mg/dL
Ketones,UA POCT: NEGATIVE mg/dL
Lot #: 36253602
Nitrite,UA POCT: NEGATIVE
PH,UA POCT: 7 (ref 5–8)
Specific gravity,UA POCT: 1.01 (ref 1.002–1.030)

## 2018-04-06 MED ORDER — NITROFURANTOIN MONOHYD MACRO 100 MG PO CAPS *I*
100.0000 mg | ORAL_CAPSULE | Freq: Two times a day (BID) | ORAL | 0 refills | Status: DC
Start: 2018-04-06 — End: 2018-05-20

## 2018-04-06 NOTE — Addendum Note (Signed)
Addended by: Wendall Mola on: 04/06/2018 02:18 PM     Modules accepted: Orders

## 2018-04-06 NOTE — Progress Notes (Signed)
CC?uti    HPI  Began 10 days ago with mild dysuria and odor then tried to drink more fluids but symptoms eventually progressed and last night awoke and felt poorly--odor last night, cloudy, no blood, pos frequency, nocturia times 4,no fever, no new back pain, unsettled feeling over bladder, oppos urgency      Review of systems      No fever  No chest pain  No shortness of breath  No cough  No change of appetite  No nausea  No vomiting  No abdominal pain  No stool changes          Current Outpatient Prescriptions   Medication Sig Dispense Refill    buPROPion (WELLBUTRIN XL) 150 MG 24 hr tablet Take 1 tablet (150 mg total) by mouth every morning   Swallow whole. Do not crush, break, or chew. 90 tablet 1    rizatriptan (MAXALT) 5 MG tablet Take 1 tablet (5 mg total) by mouth as needed for Migraine   May repeat in 2 hours if needed.  Max 6 tabs/day. 10 tablet 3    ranitidine (ZANTAC) 150 MG tablet Take 1 tablet (150 mg total) by mouth nightly (Patient taking differently: Take 150 mg by mouth 2 times daily as needed   ) 30 tablet 3    Calcium 500 MG tablet Take 1 tablet by mouth daily         Cholecalciferol (VITAMIN D3) 1000 UNIT tablet Take 1,000 Units by mouth every morning         Ibuprofen (ADVIL PO) Take 600 mg by mouth every 6 hours as needed          No current facility-administered medications for this visit.          Vitals:    04/06/18 1144   BP: 118/82   Pulse: 70   Resp: 19   Temp: 36.3 C (97.4 F)   Weight: 104.8 kg (231 lb)         EXAM  Gen: patient appears comfortable and in no distress  Cv: RRR, Nl S1,S2  Resp: CTA b/l, no rales, no rhonchi  Back: No spinal CVA tenderness  Abdomen: Vaguely tender to palpation over bladder  Ext: no edema          A/P pleasant female with symptoms suggestive of UTI has had them in the past urine dip mildly suggestive with trace leuk esterase we will put her on Macrobid twice a day for 7 days urine analysis and culture formally sent out follow-up if worsening does  not improve.

## 2018-04-07 LAB — URINALYSIS WITH MICROSCOPIC
Hyaline Casts,UA: 1 /lpf (ref 0–2)
Ketones, UA: NEGATIVE
Leuk Esterase,UA: NEGATIVE
Nitrite,UA: NEGATIVE
Protein,UA: NEGATIVE mg/dL
RBC,UA: 1 /hpf (ref 0–2)
Specific Gravity,UA: 1.014 (ref 1.002–1.030)
WBC,UA: 5 /hpf (ref 0–5)
pH,UA: 7 (ref 5.0–8.0)

## 2018-04-08 ENCOUNTER — Encounter: Payer: Self-pay | Admitting: Primary Care

## 2018-04-08 LAB — AEROBIC CULTURE

## 2018-05-01 ENCOUNTER — Telehealth: Payer: Self-pay | Admitting: Primary Care

## 2018-05-01 DIAGNOSIS — E663 Overweight: Secondary | ICD-10-CM

## 2018-05-01 DIAGNOSIS — G4730 Sleep apnea, unspecified: Secondary | ICD-10-CM

## 2018-05-01 DIAGNOSIS — Z0000 Encounter for general adult medical examination without abnormal findings: Secondary | ICD-10-CM

## 2018-05-01 DIAGNOSIS — F32A Depression, unspecified: Secondary | ICD-10-CM

## 2018-05-01 DIAGNOSIS — E559 Vitamin D deficiency, unspecified: Secondary | ICD-10-CM

## 2018-05-01 NOTE — Telephone Encounter (Signed)
Please notify pt that I have sent in to [facility] lab system his lab requisition for the fasting labs for upcoming physical

## 2018-05-02 NOTE — Telephone Encounter (Signed)
My Chart message sent

## 2018-05-07 ENCOUNTER — Other Ambulatory Visit
Admission: RE | Admit: 2018-05-07 | Discharge: 2018-05-07 | Disposition: A | Discharge status: Home / Self Care | Payer: No Typology Code available for payment source | Source: Ambulatory Visit | Attending: Primary Care | Admitting: Primary Care

## 2018-05-07 DIAGNOSIS — F32A Depression, unspecified: Secondary | ICD-10-CM

## 2018-05-07 DIAGNOSIS — G4730 Sleep apnea, unspecified: Secondary | ICD-10-CM | POA: Insufficient documentation

## 2018-05-07 DIAGNOSIS — F329 Major depressive disorder, single episode, unspecified: Secondary | ICD-10-CM | POA: Insufficient documentation

## 2018-05-07 DIAGNOSIS — E663 Overweight: Secondary | ICD-10-CM

## 2018-05-07 DIAGNOSIS — E559 Vitamin D deficiency, unspecified: Secondary | ICD-10-CM | POA: Insufficient documentation

## 2018-05-07 DIAGNOSIS — Z0000 Encounter for general adult medical examination without abnormal findings: Secondary | ICD-10-CM | POA: Insufficient documentation

## 2018-05-07 LAB — URINALYSIS WITH MICROSCOPIC
Blood,UA: NEGATIVE
Ketones, UA: NEGATIVE
Leuk Esterase,UA: NEGATIVE
Nitrite,UA: NEGATIVE
Protein,UA: NEGATIVE mg/dL
RBC,UA: 1 /hpf (ref 0–2)
Specific Gravity,UA: 1.018 (ref 1.002–1.030)
WBC,UA: 1 /hpf (ref 0–5)
pH,UA: 7 (ref 5.0–8.0)

## 2018-05-07 LAB — LIPID PANEL
Chol/HDL Ratio: 2.7
Cholesterol: 212 mg/dL — AB
HDL: 80 mg/dL — ABNORMAL HIGH (ref 40–60)
LDL Calculated: 121 mg/dL
Non HDL Cholesterol: 132 mg/dL
Triglycerides: 54 mg/dL

## 2018-05-07 LAB — CBC AND DIFFERENTIAL
Baso # K/uL: 0 10*3/uL (ref 0.0–0.1)
Basophil %: 0.4 %
Eos # K/uL: 0.1 10*3/uL (ref 0.0–0.4)
Eosinophil %: 1.9 %
Hematocrit: 42 % (ref 34–45)
Hemoglobin: 12.9 g/dL (ref 11.2–15.7)
IMM Granulocytes #: 0 10*3/uL
IMM Granulocytes: 0.2 %
Lymph # K/uL: 1.4 10*3/uL (ref 1.2–3.7)
Lymphocyte %: 29.4 %
MCH: 27 pg/cell (ref 26–32)
MCHC: 31 g/dL — ABNORMAL LOW (ref 32–36)
MCV: 89 fL (ref 79–95)
Mono # K/uL: 0.4 10*3/uL (ref 0.2–0.9)
Monocyte %: 9.2 %
Neut # K/uL: 2.8 10*3/uL (ref 1.6–6.1)
Nucl RBC # K/uL: 0 10*3/uL (ref 0.0–0.0)
Nucl RBC %: 0 /100 WBC (ref 0.0–0.2)
Platelets: 189 10*3/uL (ref 160–370)
RBC: 4.8 MIL/uL (ref 3.9–5.2)
RDW: 14.9 % — ABNORMAL HIGH (ref 11.7–14.4)
Seg Neut %: 58.9 %
WBC: 4.8 10*3/uL (ref 4.0–10.0)

## 2018-05-07 LAB — BASIC METABOLIC PANEL
Anion Gap: 11 (ref 7–16)
CO2: 28 mmol/L (ref 20–28)
Calcium: 9.2 mg/dL (ref 8.6–10.2)
Chloride: 103 mmol/L (ref 96–108)
Creatinine: 0.72 mg/dL (ref 0.51–0.95)
GFR,Black: 105 *
GFR,Caucasian: 91 *
Glucose: 92 mg/dL (ref 60–99)
Lab: 12 mg/dL (ref 6–20)
Potassium: 5 mmol/L (ref 3.3–5.1)
Sodium: 142 mmol/L (ref 133–145)

## 2018-05-07 LAB — AST: AST: 19 U/L (ref 0–35)

## 2018-05-07 LAB — ALT: ALT: 18 U/L (ref 0–35)

## 2018-05-07 LAB — TSH: TSH: 1.76 u[IU]/mL (ref 0.27–4.20)

## 2018-05-07 LAB — VITAMIN D: 25-OH Vit Total: 35 ng/mL (ref 30–60)

## 2018-05-20 ENCOUNTER — Encounter: Payer: Self-pay | Admitting: Gastroenterology

## 2018-05-20 ENCOUNTER — Ambulatory Visit: Payer: No Typology Code available for payment source | Attending: Primary Care | Admitting: Primary Care

## 2018-05-20 ENCOUNTER — Encounter: Payer: Self-pay | Admitting: Primary Care

## 2018-05-20 VITALS — BP 128/82 | HR 69 | Wt 229.3 lb

## 2018-05-20 DIAGNOSIS — E559 Vitamin D deficiency, unspecified: Secondary | ICD-10-CM

## 2018-05-20 DIAGNOSIS — F329 Major depressive disorder, single episode, unspecified: Secondary | ICD-10-CM

## 2018-05-20 DIAGNOSIS — M1712 Unilateral primary osteoarthritis, left knee: Secondary | ICD-10-CM

## 2018-05-20 DIAGNOSIS — M1711 Unilateral primary osteoarthritis, right knee: Secondary | ICD-10-CM

## 2018-05-20 DIAGNOSIS — G4730 Sleep apnea, unspecified: Secondary | ICD-10-CM

## 2018-05-20 DIAGNOSIS — E663 Overweight: Secondary | ICD-10-CM

## 2018-05-20 DIAGNOSIS — G43909 Migraine, unspecified, not intractable, without status migrainosus: Secondary | ICD-10-CM

## 2018-05-20 DIAGNOSIS — Z0000 Encounter for general adult medical examination without abnormal findings: Secondary | ICD-10-CM

## 2018-05-20 DIAGNOSIS — F32A Depression, unspecified: Secondary | ICD-10-CM

## 2018-05-20 LAB — PCMH DEPRESSION ASSESSMENT

## 2018-05-20 MED ORDER — ZOSTER VAC RECOMB ADJUVANTED 50 MCG IM SUSR *I*
50.0000 ug | Freq: Once | INTRAMUSCULAR | 1 refills | Status: AC
Start: 2018-05-20 — End: 2018-05-20

## 2018-05-20 NOTE — Progress Notes (Signed)
Chief complaint:Pleasant female seen here for physical as well as review of multiple issues we reviewed together family history past medical history medications allergies and immunizations.  Recent lab work reviewed together.         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    Migraine headaches manageable with the generic Maxalt they don't occur too often    Vitamin D deficiency level good continues take supplement    Depression mood has been good on bupropion no issues no faltering self or others    Heartburn intermittent and not regular we'll take a Zantac either before or after eating something that she knows will cause symptoms    Overweight weight generally stable encouraged decrease calorie intake and regular aerobic exercise as able for most days of the week    Last EKG was several years ago we will repeat P1 today    Dermatology wise rosacea no further issues has an appointment with dermatology denies any concerns of new lesions    Sleep apnea she declined CPAP in the past and was told it wasn't severe enough to require any treatment    Sleep has been better overall of late    Cholesterol risk profile at 2.8% hold on drug treatment        Current Outpatient Medications   Medication Sig Dispense Refill    buPROPion (WELLBUTRIN XL) 150 MG 24 hr tablet Take 1 tablet (150 mg total) by mouth every morning   Swallow whole. Do not crush, break, or chew. 90 tablet 1    ranitidine (ZANTAC) 150 MG tablet Take 1 tablet (150 mg total) by mouth nightly (Patient taking differently: Take 150 mg by mouth 2 times daily as needed   ) 30 tablet 3    Calcium 500 MG tablet Take 1 tablet by mouth daily         Cholecalciferol (VITAMIN D3) 1000 UNIT tablet Take 1,000  Units by mouth every morning         zoster vaccine recombinant (SHINGRIX) 50 MCG injection Inject 0.5 mLs (50 mcg total) into the muscle once for 1 dose Repeat dose once in 2-6 months 0.5 mL 1    rizatriptan (MAXALT) 5 MG tablet Take 1 tablet (5 mg total) by mouth as needed for Migraine   May repeat in 2 hours if needed.  Max 6 tabs/day. 10 tablet 3    Ibuprofen (ADVIL PO) Take 600 mg by mouth every 6 hours as needed          No current facility-administered medications for this visit.         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                 Past Medical History:   Diagnosis Date    Abdominal pain 07/19/2012    Anxiety 3/12    Benign neoplasm of colon     Calcaneal bursitis 01/14/2012    right    Chronic cholecystitis 06/6107    Complication of anesthesia     PONV    Depression     Diverticulosis 11/27/2013    mild    Internal hemorrhoids     Migraine     q2-3 months    Mitral valve prolapse     OA (osteoarthritis)     left knee    Osteoarthritis, knee 12/03/2010    Rosacea 04/05/2003          Ruptured cerebral aneurysm     age 52    Sleep apnea     does not use CPAP    Syncope     d/t migraines    Urinary tract infection     frequent  3 this year    Vertigo         Past Surgical History:   Procedure Laterality Date    CESAREAN SECTION, CLASSIC  '86 '88    spinal    CHOLECYSTECTOMY, LAPAROSCOPIC  11/16/2012    COLONOSCOPY  2012    COLONOSCOPY  11/24/2013    10/2018    KNEE ARTHROSCOPY Left 06/19/11    strabismus  correction Right 1972, 1993    x2--child + 4 yo    [place]    4 wisdom and B/L bicuspids        Family History   Problem Relation Age of Onset    Breast cancer Mother     Depression Mother     Stroke Father     Heart failure Father     Heart Disease Father     Breast cancer Sister     Heart Disease Brother     Asthma Sister     Ovarian cancer Maternal Grandmother     Colon cancer Maternal Grandfather     Depression Sister     Depression Brother     Depression Daughter     Stroke Paternal Grandmother     Stroke Paternal Grandfather     Substance abuse Sister         Drug - past    Substance abuse Brother         Alcohol - current       Review of systems:    Generally feeling:  Well  Feeling tired:  Mild not new  Fever:  No  Chills:  No  Recent weight change:  No  Headache:  occas migraines--not new  Vision problems:  No  Hearing loss:  No  Earache:  No  Ringing in the ears (tinnitus):  No  Nasal symptoms:  No  Sore throat:  No  Chest pain:  No  Palpitations:occas brief palps  Leg pain with exercise (claudication):  No  Shortness of breath:  No  Cough:  No   wheezing:  No  Change in appetite:  No  Heartburn: controlled with occas zantac  Nausea:  No  Vomiting:  No  Abdominal pain:  No  Change in the stool:  No  Diarrhea:  No  Constipation:  No  Blood in  urine:  No  Increased urinary frequency:  No  Pain during urination:  No  Excessive thirst or fluid intake:  No  Excessive sweating:  No  Easy bleeding:  No  Easy bruising tendency:  No  Back pain:  No  Generalized muscle aches:  No  Localized joint pain:  No  Dizziness: stable occas vertigo  Lightheadedness:  No  Fainting:  No  Memory loss:  No  Seizures:  No  Anxiety:  Worries but stable  Depression:  Doing okay  Itching:  No  Skin rash:  No  Skin lesion:  No    Social history extended    Caffeine use:2 cups a day  Tobacco TML:YYTKP  Alcohol use:2 a month  Drug TWS:FKCL  Sleep habits:fair  Sunscreen  EXN:TZGY  Ophthalmology:good  Dental hygiene:good  Dentist:good  Exercise habits:inconsistent--goal is 42min  Most days of week  Work history:full time  Marital history: married       Vitals:    05/20/18 0919   BP: 128/82   Pulse:    Weight:      Body mass index is 38.16 kg/m.    Physical exam:    General appearance:  Overweight habitus.  Well-developed, well groomed.  Appears stated age.  No acute distress.  Color good.  Mental status:  Appears alert and oriented.  Affect appropriate.  Skin:  Skin color and turgor normal.  No suspicious lesions, masses, rashes, or ulcerations.  Nails  and hair appear normal.  Head:  Normocephalic  Ears:  External ears without scars, masses, or lesions.  External auditory canal intact, clear, and without lesions.  TMs intact with normal light reflex and landmarks.  Acuity to conversational tones good.  Eyes:  extraocular movements intact.  Lids without defect, conjunctiva and sclera appear normal.  Fundi grossly normal.  Nose:  Nasal mucosa and turbinates pink, septum midline, no lesions.  Mouth:  Teeth in good repair.  Gums pink without lesions.  Normal appearing mucosa, palate, and tongue.  Oropharynx:  Moist without exudate, erythema, or swelling.  Neck:  Symmetric, trachea midline.  Thyroid nontender without enlargement or masses.  Carotids with no bruits bilaterally.  No cervical lymphadenopathy.  Breasts:  Deferred by patient was recently examined by GYN  Chest:  Respirations unlabored.  Chest was symmetric with no masses.  Breath sounds clear bilaterally without wheezes, rubs, rales, or rhonchi.  CV:  Normal S1 and S2 without murmur, rub, gallop, or click.  No edema, clubbing, or cyanosis.  No varicosities.  Dorsalis pedis pulses full and symmetrical.  Abdomen:  Abdomen soft with normal bowel sounds.  No guarding or rebound.  No palpable masses or tenderness.  Liver without tenderness or enlargement.  No aortic widening.  GU and rectal deferred to GYN  Musculoskeletal:   Muscle tone and strength normal for age, without atrophy or abnormal movements.  Extremities:  Joints with full range of motion, without tenderness, crepitus, or contracture.  No obvious joint deformities or effusions.  Neuro:  Cranial nerves II through XII grossly intact.  Motor strength symmetrical without obvious weakness.  Superficial sensation intact bilaterally to light touch.  Observed dexterity without ataxia or tremor.  Deep tendon reflexes 2+ and symmetric bilaterally.         Assessment and plan:         Pleasant female seen here for physical and review of several issues.    Left greater than right knee osteoarthritis worries in the winter we'll get her a handicap permit as she's already had a bad fall in the past from the  ice.  Continues to see orthopedics for injections.  Some point will likely get replaced    Heartburn controlled with when necessary ranitidine    Mood doing well at bupropion continue same    Migraines has generic Maxalt use when necessary    Cholesterol elevated should improve with some weight loss and exercise the 10 year risk is low we'll check EKG today    Shingles vaccine sent to pharmacy    Patient aware due for colonoscopy and this bring    Sleep has been okay.    We reviewed encourage regular daily exercise and decrease calorie intake    Blood pressure fair no treatment needed at this time.        Counseling and education:      Healthcare proxy:has one  Lab results:reviewed  Diet and bodyweight discussed  Aerobic exercise discussed  Alcohol use discussed  Recreational drug use discussed  Colon cancer screening discussed--reviewed due in spring 2020  Ob/Gyn care discussed--dr Kenton Kingfisher  Osteoporosis prevention and screening discussed  Breast self-exam discussed  Mammogram discussed  shingrix vaccine discussed  Skin cancer awareness and prevention discussed  Cardiac risk factor modification discussed  Motor vehicle safety discussed  Next H&P:79yr

## 2018-05-24 ENCOUNTER — Encounter: Payer: Self-pay | Admitting: Physical Medicine and Rehabilitation

## 2018-06-28 ENCOUNTER — Encounter: Payer: Self-pay | Admitting: Physical Medicine and Rehabilitation

## 2018-06-28 ENCOUNTER — Ambulatory Visit
Payer: No Typology Code available for payment source | Attending: Physical Medicine and Rehabilitation | Admitting: Physical Medicine and Rehabilitation

## 2018-06-28 VITALS — BP 134/61 | HR 78 | Temp 99.3°F

## 2018-06-28 DIAGNOSIS — M1712 Unilateral primary osteoarthritis, left knee: Secondary | ICD-10-CM | POA: Insufficient documentation

## 2018-06-28 MED ORDER — LIDOCAINE HCL 1 % IJ SOLN *I*
5.0000 mL | Freq: Once | INTRAMUSCULAR | Status: AC | PRN
Start: 2018-06-28 — End: 2018-06-28
  Administered 2018-06-28: 5 mL via INTRA_ARTICULAR

## 2018-06-28 MED ORDER — HYALURONAN 88 MG/4ML IX SOSY *I*
88.0000 mg | PREFILLED_SYRINGE | Freq: Once | INTRA_ARTICULAR | Status: AC | PRN
Start: 2018-06-28 — End: 2018-06-28
  Administered 2018-06-28: 88 mg via INTRA_ARTICULAR

## 2018-06-28 NOTE — Procedures (Signed)
Large Joint Aspiration/Injection Procedure: L knee intra - articular    Date/Time: 06/28/2018 10:20 AM EST  Consent given by: patient  Site marked: site marked  Timeout: Immediately prior to procedure a time out was called to verify the correct patient, procedure, equipment, support staff and site/side marked as required     Procedure Details    Location: knee - L knee intra - articular  Preparation: The site was prepped using the usual aseptic technique.  Ultrasound guidance:  Ultrasound was utilized to improve needle visualization, injection accuracy, and anatomic localization.    Anesthetics administered: 5 mL lidocaine hcl 1 %  Viscosupplementation & other medications administered: 88 mg hyaluronate 88 MG/4ML  Dressing:  A dry, sterile dressing was applied.  Patient tolerance: patient tolerated the procedure well with no immediate complications      Patient Name: [name]  MRN: [id number]  Date: 06/28/2018  Procedure : Ultrasound guided injection  Laterality: Left  Body part: Knee  Examiner : Inda Castle, MD  Equipment : [facility], [place].   CPT Code : [phone]  Indication : Reduction of procedural pain    Technique: The patient is placed in a supine position.  A bolster is placed behind the knee.  The site was appropriately marked, and a time-out was performed prior to the procedure.  The skin is prepped with chlorhexadine in the usual fashion. Linear transducer is prepped with chlorhexadine and utilized to visualize the anterior knee.  Linear transducer was used to visualize the joint via a lateral window with sterile ultrasound gel. Ultrasound imagery of the injection site in long and short axes were obtained with the [facility] and appropriated images were labeled, saved and permanently archived.    Findings : A limited diagnostic scan of the joint was done. Small fluid collection was seen. No disruption of tendons.     Procedure: Risks, benefits, and alternatives to injection  were discussed with the patient.  [name] specifically acknowledges the possibility of pain, bleeding, infection, color change of skin, systemic reaction and lack of clinical improvement.  Verbal informed consent obtained.  Pre-Procedure Check was done:  Correct patient- Correct procedure - Ultrasound. Correct site marked. Correct patient position..  The injection is done on the left.  Local anesthetic with 1% lidocine 5 mL was given.  Using ultrasound guidance a 20 gauge 1.5 inch needle was inserted through the skin and into the suprapatellar pouch via a superior-lateral portal with avoidance of bony prominences, blood vessels and other vulnerable structures.  Needle tip joint space verification was done with the ultrasound then 1 ampule of Monovisc was injected, slowly but with consistent pressure, with free flow of fluid into the joint visualized.  Joint distention visualized.  The needle is removed, skin is wiped with alcohol, hemostasis is achieved and a bandage is applied.  The patient tolerated the procedure well without difficulty.  Warning signs and routine aftercare are discussed with the patient.      Impression:  Ultrasound Real time guidance Left Knee Intra-Articular Injection for Left Knee osteoarthritis.    Inda Castle, MD

## 2018-07-25 ENCOUNTER — Other Ambulatory Visit: Payer: Self-pay | Admitting: Primary Care

## 2018-07-25 MED ORDER — BUPROPION HCL 150 MG PO TB24 *I*
150.0000 mg | ORAL_TABLET | Freq: Every morning | ORAL | 1 refills | Status: DC
Start: 2018-07-25 — End: 2019-01-18

## 2018-07-26 ENCOUNTER — Other Ambulatory Visit: Payer: BLUE CROSS/BLUE SHIELD | Admitting: Primary Care

## 2018-08-04 ENCOUNTER — Other Ambulatory Visit: Payer: Self-pay | Admitting: Gastroenterology

## 2018-08-29 ENCOUNTER — Encounter: Payer: Self-pay | Admitting: Primary Care

## 2018-08-29 NOTE — Progress Notes (Signed)
Pre-Visit Planning    Health Maintenance Due   Topic Date Due    IMM-PNEUMOCOCCAL VACCINE AGE 61-64 HIGH RISK  02[birth year]20/1962    IMM-ZOSTER (1 of 2) 02/20/2008    Colon Cancer Screening Other  11/25/2018       Notes:  - Last Mammogram: 08/04/2018 Completed   Colonoscopy due soon, no referral to Gastro in chart.    Completed on 08/29/18 by Corlis Hove

## 2018-08-31 ENCOUNTER — Encounter: Payer: Self-pay | Admitting: Primary Care

## 2018-08-31 ENCOUNTER — Ambulatory Visit: Payer: BLUE CROSS/BLUE SHIELD | Attending: Primary Care | Admitting: Primary Care

## 2018-08-31 VITALS — BP 138/88 | HR 66 | Temp 98.3°F | Wt 236.0 lb

## 2018-08-31 DIAGNOSIS — R6889 Other general symptoms and signs: Secondary | ICD-10-CM

## 2018-08-31 DIAGNOSIS — R1310 Dysphagia, unspecified: Secondary | ICD-10-CM

## 2018-08-31 DIAGNOSIS — R0989 Other specified symptoms and signs involving the circulatory and respiratory systems: Secondary | ICD-10-CM

## 2018-08-31 LAB — PCMH DEPRESSION ASSESSMENT

## 2018-08-31 MED ORDER — OMEPRAZOLE 20 MG PO CPDR *I*
20.0000 mg | DELAYED_RELEASE_CAPSULE | Freq: Two times a day (BID) | ORAL | 1 refills | Status: DC
Start: 2018-08-31 — End: 2018-12-12

## 2018-08-31 NOTE — Progress Notes (Signed)
CCsore throat      HPI    Pt here with cc of sore throat--began 2.5 weeks ago --started as a sore throat and now as she eats she gets a build up of mucous and needs to clear throat, having lots of post nasal drip, easier to have liquids,she tried pepcid every night but no help, no fever,some clear d/c, voice is hoarse at times,does not feel ill,cough is minimal, clearing throat a lot, she is taking smaller bites when she eats      Mood doing fine  Review of systems    Feeling fine  No fever  No headache  No chest pain but rare lower neck pressure after eating  No shortness of breath other than lack of exercise per pt  No change of appetite  No nausea  No vomiting  No abdominal pain  No stool changes  No urinary symptoms  No dizziness        Current Outpatient Medications   Medication Sig Dispense Refill    famotidine (PEPCID) 20 MG tablet Take 20 mg by mouth daily      buPROPion (WELLBUTRIN XL) 150 MG 24 hr tablet Take 1 tablet (150 mg total) by mouth every morning Swallow whole. Do not crush, break, or chew. 90 tablet 1    rizatriptan (MAXALT) 5 MG tablet Take 1 tablet (5 mg total) by mouth as needed for Migraine   May repeat in 2 hours if needed.  Max 6 tabs/day. 10 tablet 3    Calcium 500 MG tablet Take 1 tablet by mouth daily         Cholecalciferol (VITAMIN D3) 1000 UNIT tablet Take 1,000 Units by mouth every morning         Ibuprofen (ADVIL PO) Take 600 mg by mouth every 6 hours as needed          No current facility-administered medications for this visit.          Vitals:    08/31/18 1506   BP: 138/88   Pulse: 66   Temp: 36.8 C (98.3 F)   Weight: 107 kg (236 lb)         EXAM  Gen: patient appears comfortable and in no distress  HEENT oropharynx clear moist nasal mucosa without discharge present no rhinorrhea   no adenopathy no thyroid masses posterior oropharynx clear without erythema  Cv: RRR, Nl S1,S2  Resp: CTA b/l, no rales, no rhonchi  Ext: no edema          A/P pleasant female with signs and  symptoms suggestive of excessive throat clearing and globus sensation and question of dysphagia with solids we will put her on omeprazole 20 mg half hour before meals twice daily and set her up with GI for possible upper endoscopy.  Side effects omeprazole including diarrhea reviewed she will contact me if symptoms change.  She is not ill do not believe this scratchy sore throat is infectious related etc.  No signs or symptoms to really suggest chronic rhinitis nor a pharyngitis at this time

## 2018-09-26 ENCOUNTER — Encounter: Payer: Self-pay | Admitting: Gastroenterology

## 2018-09-26 ENCOUNTER — Encounter: Payer: Self-pay | Admitting: Primary Care

## 2018-09-26 ENCOUNTER — Other Ambulatory Visit: Payer: Self-pay | Admitting: Primary Care

## 2018-11-11 NOTE — Progress Notes (Signed)
Pre-Visit Planning    Health Maintenance Due   Topic Date Due    IMM-PNEUMOCOCCAL VACCINE AGE 61-64 HIGH RISK  02[birth year]20/1962    IMM-ZOSTER (1 of 2) 02/20/2008    Colon Cancer Screening Other  11/25/2018       Notes:  - Last Mammogram: 08/04/2018   Colonoscopy due soon, no referral to Gastro    Completed on 11/11/18 by Corlis Hove

## 2018-11-14 ENCOUNTER — Ambulatory Visit: Payer: BLUE CROSS/BLUE SHIELD | Admitting: Primary Care

## 2018-11-14 DIAGNOSIS — F329 Major depressive disorder, single episode, unspecified: Secondary | ICD-10-CM

## 2018-11-14 DIAGNOSIS — F32A Depression, unspecified: Secondary | ICD-10-CM

## 2018-11-14 DIAGNOSIS — K219 Gastro-esophageal reflux disease without esophagitis: Secondary | ICD-10-CM

## 2018-11-14 DIAGNOSIS — G43909 Migraine, unspecified, not intractable, without status migrainosus: Secondary | ICD-10-CM

## 2018-11-14 LAB — PCMH DEPRESSION ASSESSMENT

## 2018-11-14 NOTE — Progress Notes (Signed)
CC follow up of several issues      HPI    Heartburn still having some but much better control with twice daily omeprazole and Pepcid at night has a scope pending June 24 has cut back and cut out her caffeine intake completely.  Dr. Nani Skillern is going to do her upper endoscopy and she will have a colonoscopy delayed due to the pandemic    Migraine headaches manageable actually really's been fine of late just her regular headaches does have Maxalt when needed    Depression mood is doing fine on the bupropion stressful with work and the [employer].  Mother is unfortunately in hospice due to heart failure and may go out to visit her she's in her own home.    Overweight trying exercise more.    Denies any new complaints denies need for med refills at this time    Review of systems    Feeling fine  No fever  No headache  No chest pain  No shortness of breath  No cough  No change of appetite  No abdominal pain  No stool changes  No urinary symptoms            Current Outpatient Medications   Medication Sig Dispense Refill    famotidine (PEPCID) 20 MG tablet Take 20 mg by mouth at bedtime       omeprazole (PRILOSEC) 20 MG capsule Take 1 capsule (20 mg total) by mouth 2 times daily (before meals) -30 minutes before breakfast and dinner (Patient taking differently: Take 20 mg by mouth daily (before breakfast) -30 minutes before breakfast and dinner) 60 capsule 1    buPROPion (WELLBUTRIN XL) 150 MG 24 hr tablet Take 1 tablet (150 mg total) by mouth every morning Swallow whole. Do not crush, break, or chew. 90 tablet 1    Calcium 500 MG tablet Take 1 tablet by mouth daily         Cholecalciferol (VITAMIN D3) 1000 UNIT tablet Take 1,000 Units by mouth every morning         rizatriptan (MAXALT) 5 MG tablet Take 1 tablet (5 mg total) by mouth as needed for Migraine   May repeat in 2 hours if needed.  Max 6 tabs/day. 10 tablet 3    Ibuprofen (ADVIL PO) Take 600 mg by mouth every 6 hours as needed          No current  facility-administered medications for this visit.          There were no vitals filed for this visit.      EXAM  Gen: patient appears comfortable and in no distress  HEENT normocephalic atraumatic  Respiratory: Nonlabored breathing no audible coughing or wheezing   Psych: Normal affect  Neuro: Normal speech answers questions appropriately        A/PPleasant female seen on video for follow-up of several issues    Heartburn doing better on twice a day PPI and Pepcid at night has an endoscopy pending avoiding caffeine    Migraine headaches manageable does have Maxalt when needed    Mood doing okay with bupropion-she declines a need to increase dose etc.  Continue same

## 2018-12-06 ENCOUNTER — Other Ambulatory Visit
Admission: RE | Admit: 2018-12-06 | Discharge: 2018-12-06 | Disposition: A | Discharge status: Home / Self Care | Payer: BLUE CROSS/BLUE SHIELD | Source: Ambulatory Visit | Attending: Gastroenterology | Admitting: Gastroenterology

## 2018-12-06 ENCOUNTER — Ambulatory Visit
Admission: AD | Admit: 2018-12-06 | Discharge: 2018-12-06 | Disposition: A | Discharge status: Home / Self Care | Payer: BLUE CROSS/BLUE SHIELD | Source: Ambulatory Visit | Attending: Emergency Medicine | Admitting: Emergency Medicine

## 2018-12-06 DIAGNOSIS — Z20828 Contact with and (suspected) exposure to other viral communicable diseases: Secondary | ICD-10-CM | POA: Insufficient documentation

## 2018-12-06 DIAGNOSIS — Z1159 Encounter for screening for other viral diseases: Secondary | ICD-10-CM | POA: Insufficient documentation

## 2018-12-06 NOTE — ED Triage Notes (Signed)
Patient presenting to Urgent Care for testing only. Outside provider ordered and referred patient for COVID-19 test. NP swab obtained and sent for analysis.         Triage Note   Zanden Colver A Diquan Kassis, RN

## 2018-12-07 LAB — COVID-19 PCR: COVID-19 PCR: 0

## 2018-12-09 ENCOUNTER — Other Ambulatory Visit
Admission: RE | Admit: 2018-12-09 | Discharge: 2018-12-09 | Disposition: A | Discharge status: Home / Self Care | Payer: BLUE CROSS/BLUE SHIELD | Source: Ambulatory Visit | Attending: Pathology | Admitting: Pathology

## 2018-12-09 ENCOUNTER — Encounter: Payer: Self-pay | Admitting: Gastroenterology

## 2018-12-09 DIAGNOSIS — R0989 Other specified symptoms and signs involving the circulatory and respiratory systems: Secondary | ICD-10-CM | POA: Insufficient documentation

## 2018-12-12 ENCOUNTER — Encounter: Payer: Self-pay | Admitting: Primary Care

## 2018-12-12 DIAGNOSIS — K449 Diaphragmatic hernia without obstruction or gangrene: Secondary | ICD-10-CM | POA: Insufficient documentation

## 2018-12-12 DIAGNOSIS — K219 Gastro-esophageal reflux disease without esophagitis: Secondary | ICD-10-CM

## 2018-12-12 HISTORY — DX: Gastro-esophageal reflux disease without esophagitis: K21.9

## 2018-12-12 HISTORY — DX: Diaphragmatic hernia without obstruction or gangrene: K44.9

## 2018-12-12 LAB — SURGICAL PATHOLOGY

## 2019-01-18 ENCOUNTER — Other Ambulatory Visit: Payer: Self-pay | Admitting: Primary Care

## 2019-01-18 NOTE — Telephone Encounter (Signed)
Last seen [id number] via telemedicine

## 2019-02-06 ENCOUNTER — Encounter: Payer: Self-pay | Admitting: Gastroenterology

## 2019-04-12 ENCOUNTER — Other Ambulatory Visit: Payer: Self-pay | Admitting: Primary Care

## 2019-05-09 ENCOUNTER — Encounter: Payer: Self-pay | Admitting: Gastroenterology

## 2019-05-10 ENCOUNTER — Encounter: Payer: Self-pay | Admitting: Primary Care

## 2019-05-14 ENCOUNTER — Other Ambulatory Visit
Admission: RE | Admit: 2019-05-14 | Discharge: 2019-05-14 | Disposition: A | Discharge status: Home / Self Care | Payer: BLUE CROSS/BLUE SHIELD | Source: Ambulatory Visit | Attending: Gastroenterology | Admitting: Gastroenterology

## 2019-05-14 DIAGNOSIS — Z20828 Contact with and (suspected) exposure to other viral communicable diseases: Secondary | ICD-10-CM | POA: Insufficient documentation

## 2019-05-15 ENCOUNTER — Ambulatory Visit
Admission: AD | Admit: 2019-05-15 | Discharge: 2019-05-15 | Disposition: A | Discharge status: Home / Self Care | Payer: BLUE CROSS/BLUE SHIELD | Source: Ambulatory Visit | Attending: Emergency Medicine | Admitting: Emergency Medicine

## 2019-05-15 DIAGNOSIS — Z20828 Contact with and (suspected) exposure to other viral communicable diseases: Secondary | ICD-10-CM | POA: Insufficient documentation

## 2019-05-15 NOTE — ED Triage Notes (Signed)
Patient presenting to Urgent Care for testing only. COVID-19 test ordered by outside provider     Does the patient currently have symptoms concerning for COVID-19?: No     What is the reason for testing?: Pre-operative     Nasopharyngeal  swab obtained and sent for analysis.       Triage Note   Rubbie Goostree Babcock, RN

## 2019-05-17 LAB — COVID-19 NAAT (PCR): COVID-19 NAAT (PCR): NEGATIVE

## 2019-05-17 LAB — COVID-19 PCR

## 2019-05-19 ENCOUNTER — Encounter: Payer: Self-pay | Admitting: Gastroenterology

## 2019-05-19 ENCOUNTER — Encounter: Payer: Self-pay | Admitting: Primary Care

## 2019-05-19 HISTORY — PX: COLONOSCOPY: SHX174

## 2019-05-19 LAB — HM COLONOSCOPY

## 2019-05-24 ENCOUNTER — Encounter: Payer: Self-pay | Admitting: Primary Care

## 2019-05-24 ENCOUNTER — Encounter: Payer: No Typology Code available for payment source | Admitting: Primary Care

## 2019-06-04 ENCOUNTER — Telehealth: Payer: Self-pay | Admitting: Primary Care

## 2019-06-04 DIAGNOSIS — Z0000 Encounter for general adult medical examination without abnormal findings: Secondary | ICD-10-CM

## 2019-06-04 DIAGNOSIS — E663 Overweight: Secondary | ICD-10-CM

## 2019-06-04 DIAGNOSIS — Z79899 Other long term (current) drug therapy: Secondary | ICD-10-CM | POA: Insufficient documentation

## 2019-06-04 DIAGNOSIS — G4730 Sleep apnea, unspecified: Secondary | ICD-10-CM

## 2019-06-04 DIAGNOSIS — E559 Vitamin D deficiency, unspecified: Secondary | ICD-10-CM

## 2019-06-04 DIAGNOSIS — M1711 Unilateral primary osteoarthritis, right knee: Secondary | ICD-10-CM

## 2019-06-04 DIAGNOSIS — K449 Diaphragmatic hernia without obstruction or gangrene: Secondary | ICD-10-CM

## 2019-06-04 DIAGNOSIS — G43909 Migraine, unspecified, not intractable, without status migrainosus: Secondary | ICD-10-CM

## 2019-06-04 DIAGNOSIS — F32A Depression, unspecified: Secondary | ICD-10-CM

## 2019-06-04 DIAGNOSIS — M1712 Unilateral primary osteoarthritis, left knee: Secondary | ICD-10-CM

## 2019-06-04 DIAGNOSIS — K219 Gastro-esophageal reflux disease without esophagitis: Secondary | ICD-10-CM

## 2019-06-04 HISTORY — DX: Other long term (current) drug therapy: Z79.899

## 2019-06-04 NOTE — Telephone Encounter (Signed)
Please notify pt that I have sent in to [place] lab system his lab requisition for the fasting labs for upcoming physical

## 2019-06-05 NOTE — Telephone Encounter (Signed)
Message sent via my chart

## 2019-06-07 ENCOUNTER — Ambulatory Visit
Admission: AD | Admit: 2019-06-07 | Discharge: 2019-06-07 | Disposition: A | Discharge status: Home / Self Care | Payer: BLUE CROSS/BLUE SHIELD | Source: Ambulatory Visit | Attending: Emergency Medicine | Admitting: Emergency Medicine

## 2019-06-07 ENCOUNTER — Telehealth: Payer: Self-pay | Admitting: Primary Care

## 2019-06-07 DIAGNOSIS — Z20828 Contact with and (suspected) exposure to other viral communicable diseases: Secondary | ICD-10-CM | POA: Insufficient documentation

## 2019-06-07 DIAGNOSIS — R1110 Vomiting, unspecified: Secondary | ICD-10-CM | POA: Insufficient documentation

## 2019-06-07 DIAGNOSIS — Z20822 Contact with and (suspected) exposure to covid-19: Secondary | ICD-10-CM

## 2019-06-07 DIAGNOSIS — R5383 Other fatigue: Secondary | ICD-10-CM | POA: Insufficient documentation

## 2019-06-07 DIAGNOSIS — R0981 Nasal congestion: Secondary | ICD-10-CM

## 2019-06-07 DIAGNOSIS — R52 Pain, unspecified: Secondary | ICD-10-CM | POA: Insufficient documentation

## 2019-06-07 DIAGNOSIS — Z03818 Encounter for observation for suspected exposure to other biological agents ruled out: Secondary | ICD-10-CM | POA: Insufficient documentation

## 2019-06-07 NOTE — Telephone Encounter (Signed)
Pt.notified

## 2019-06-07 NOTE — Telephone Encounter (Signed)
Order signed.

## 2019-06-07 NOTE — Telephone Encounter (Signed)
I spoke with pt she states vomiting sinus pressure with body aches onset about 2 days ago   Pt states symptoms started with fatigue then nausea and vomiting since then congestion has also set in pt is afebrile denies any contact with known covid cases.    Pt was encouraged to self isolate and stay adequately hydrated   Pt request covid testing was offered evaluation in respiratory clinic but declined.   Order pended please advise

## 2019-06-07 NOTE — ED Notes (Signed)
Patient presenting to Urgent Care for testing only. COVID-19 test ordered by Urgent Care Provider     Patient occupation: None of the above    Does this patient currently have symptoms concerning for COVID-19?: Yes     Nasopharyngeal  swab obtained and sent for analysis.  Patient was offered a medical evaluation with an Urgent Care provider and has declined.

## 2019-06-07 NOTE — Telephone Encounter (Signed)
Since last night, she was vomiting, has body aches, no fever.  Wonders if she should be tested for Covid.  This am she was able to drink and keep water down.

## 2019-06-10 ENCOUNTER — Encounter: Payer: Self-pay | Admitting: Primary Care

## 2019-06-10 LAB — COVID-19 NAAT (PCR): COVID-19 NAAT (PCR): NEGATIVE

## 2019-06-10 LAB — COVID-19 PCR

## 2019-06-17 ENCOUNTER — Other Ambulatory Visit
Admission: RE | Admit: 2019-06-17 | Discharge: 2019-06-17 | Disposition: A | Discharge status: Home / Self Care | Payer: BLUE CROSS/BLUE SHIELD | Source: Ambulatory Visit | Attending: Primary Care | Admitting: Primary Care

## 2019-06-17 DIAGNOSIS — E559 Vitamin D deficiency, unspecified: Secondary | ICD-10-CM

## 2019-06-17 DIAGNOSIS — G4730 Sleep apnea, unspecified: Secondary | ICD-10-CM | POA: Insufficient documentation

## 2019-06-17 DIAGNOSIS — G43909 Migraine, unspecified, not intractable, without status migrainosus: Secondary | ICD-10-CM

## 2019-06-17 DIAGNOSIS — K219 Gastro-esophageal reflux disease without esophagitis: Secondary | ICD-10-CM | POA: Insufficient documentation

## 2019-06-17 DIAGNOSIS — Z79899 Other long term (current) drug therapy: Secondary | ICD-10-CM | POA: Insufficient documentation

## 2019-06-17 DIAGNOSIS — K449 Diaphragmatic hernia without obstruction or gangrene: Secondary | ICD-10-CM

## 2019-06-17 DIAGNOSIS — M1711 Unilateral primary osteoarthritis, right knee: Secondary | ICD-10-CM | POA: Insufficient documentation

## 2019-06-17 DIAGNOSIS — M1712 Unilateral primary osteoarthritis, left knee: Secondary | ICD-10-CM | POA: Insufficient documentation

## 2019-06-17 DIAGNOSIS — F32A Depression, unspecified: Secondary | ICD-10-CM

## 2019-06-17 DIAGNOSIS — F329 Major depressive disorder, single episode, unspecified: Secondary | ICD-10-CM | POA: Insufficient documentation

## 2019-06-17 DIAGNOSIS — Z0000 Encounter for general adult medical examination without abnormal findings: Secondary | ICD-10-CM | POA: Insufficient documentation

## 2019-06-17 DIAGNOSIS — E663 Overweight: Secondary | ICD-10-CM

## 2019-06-17 LAB — LIPID PANEL
Chol/HDL Ratio: 2.7
Cholesterol: 193 mg/dL
HDL: 72 mg/dL — ABNORMAL HIGH (ref 40–60)
LDL Calculated: 110 mg/dL
Non HDL Cholesterol: 121 mg/dL
Triglycerides: 55 mg/dL

## 2019-06-17 LAB — CBC AND DIFFERENTIAL
Baso # K/uL: 0 10*3/uL (ref 0.0–0.1)
Basophil %: 0.3 %
Eos # K/uL: 0.1 10*3/uL (ref 0.0–0.4)
Eosinophil %: 1.2 %
Hematocrit: 42 % (ref 34–45)
Hemoglobin: 12.2 g/dL (ref 11.2–15.7)
IMM Granulocytes #: 0 10*3/uL (ref 0.0–0.0)
IMM Granulocytes: 0.5 %
Lymph # K/uL: 1.6 10*3/uL (ref 1.2–3.7)
Lymphocyte %: 25.4 %
MCH: 26 pg/cell (ref 26–32)
MCHC: 29 g/dL — ABNORMAL LOW (ref 32–36)
MCV: 87 fL (ref 79–95)
Mono # K/uL: 0.5 10*3/uL (ref 0.2–0.9)
Monocyte %: 8.4 %
Neut # K/uL: 4.1 10*3/uL (ref 1.6–6.1)
Nucl RBC # K/uL: 0 10*3/uL (ref 0.0–0.0)
Nucl RBC %: 0 /100 WBC (ref 0.0–0.2)
Platelets: 231 10*3/uL (ref 160–370)
RBC: 4.8 MIL/uL (ref 3.9–5.2)
RDW: 14.6 % — ABNORMAL HIGH (ref 11.7–14.4)
Seg Neut %: 64.2 %
WBC: 6.5 10*3/uL (ref 4.0–10.0)

## 2019-06-17 LAB — BASIC METABOLIC PANEL
Anion Gap: 7 (ref 7–16)
CO2: 31 mmol/L — ABNORMAL HIGH (ref 20–28)
Calcium: 9.6 mg/dL (ref 8.6–10.2)
Chloride: 103 mmol/L (ref 96–108)
Creatinine: 0.9 mg/dL (ref 0.51–0.95)
GFR,Black: 80 *
GFR,Caucasian: 69 *
Glucose: 94 mg/dL (ref 60–99)
Lab: 11 mg/dL (ref 6–20)
Potassium: 4.5 mmol/L (ref 3.3–5.1)
Sodium: 141 mmol/L (ref 133–145)

## 2019-06-17 LAB — URINALYSIS WITH MICROSCOPIC
Bacteria,UA: NONE SEEN
Blood,UA: NEGATIVE
Glucose,UA: NEGATIVE mg/dL
Ketones, UA: NEGATIVE
Nitrite,UA: NEGATIVE
Specific Gravity,UA: 1.019 (ref 1.002–1.030)
pH,UA: 7.5 (ref 5.0–8.0)

## 2019-06-17 LAB — MAGNESIUM: Magnesium: 2.2 mg/dL (ref 1.6–2.5)

## 2019-06-17 LAB — ALT: ALT: 13 U/L (ref 0–35)

## 2019-06-17 LAB — VITAMIN B12: Vitamin B12: 354 pg/mL (ref 232–1245)

## 2019-06-17 LAB — AST: AST: 15 U/L (ref 0–35)

## 2019-06-17 LAB — VITAMIN D: 25-OH Vit Total: 33 ng/mL (ref 30–60)

## 2019-06-20 NOTE — Progress Notes (Signed)
Pre-Visit Planning    Health Maintenance Due   Topic Date Due    IMM-PNEUMOCOCCAL VACCINE AGE 61-64 HIGH RISK  02/20/1964    HIV Screening USPSTF/[place]  02/20/1971       Notes:  - Last Mammogram: 08/04/2018 Completed @: [facility]     Completed on 06/20/19 by Corlis Hove

## 2019-06-26 ENCOUNTER — Encounter: Payer: Self-pay | Admitting: Primary Care

## 2019-06-26 ENCOUNTER — Ambulatory Visit: Payer: BLUE CROSS/BLUE SHIELD | Admitting: Primary Care

## 2019-06-26 VITALS — BP 136/88 | HR 58 | Temp 97.9°F | Ht 64.5 in | Wt 235.0 lb

## 2019-06-26 DIAGNOSIS — M1712 Unilateral primary osteoarthritis, left knee: Secondary | ICD-10-CM

## 2019-06-26 DIAGNOSIS — G43909 Migraine, unspecified, not intractable, without status migrainosus: Secondary | ICD-10-CM

## 2019-06-26 DIAGNOSIS — Z0000 Encounter for general adult medical examination without abnormal findings: Secondary | ICD-10-CM

## 2019-06-26 DIAGNOSIS — E663 Overweight: Secondary | ICD-10-CM

## 2019-06-26 DIAGNOSIS — R3129 Other microscopic hematuria: Secondary | ICD-10-CM

## 2019-06-26 DIAGNOSIS — K449 Diaphragmatic hernia without obstruction or gangrene: Secondary | ICD-10-CM

## 2019-06-26 DIAGNOSIS — F32A Depression, unspecified: Secondary | ICD-10-CM

## 2019-06-26 DIAGNOSIS — E559 Vitamin D deficiency, unspecified: Secondary | ICD-10-CM

## 2019-06-26 DIAGNOSIS — M1711 Unilateral primary osteoarthritis, right knee: Secondary | ICD-10-CM

## 2019-06-26 DIAGNOSIS — K219 Gastro-esophageal reflux disease without esophagitis: Secondary | ICD-10-CM

## 2019-06-26 DIAGNOSIS — F329 Major depressive disorder, single episode, unspecified: Secondary | ICD-10-CM

## 2019-06-26 DIAGNOSIS — Z79899 Other long term (current) drug therapy: Secondary | ICD-10-CM

## 2019-06-26 DIAGNOSIS — G4730 Sleep apnea, unspecified: Secondary | ICD-10-CM

## 2019-06-26 LAB — PCMH DEPRESSION ASSESSMENT

## 2019-06-26 MED ORDER — FLUTICASONE PROPIONATE 50 MCG/ACT NA SUSP *I*
1.0000 | Freq: Two times a day (BID) | NASAL | 2 refills | Status: DC
Start: 2019-06-26 — End: 2019-07-18

## 2019-06-26 NOTE — Progress Notes (Signed)
Chief complaint:Pleasant female seen here for physical as well as review of multiple issues we reviewed together family history past medical history medications allergies and immunizations.  Recent lab work reviewed together.         HPI:    Knee--seeing ortho this week and hopes to get an injection.Left worse than right and knows she needs surgery on it     Rhinitis patient has noticed some rhinorrhea in the morning tends to sleep with her mouth open overnight more we will try her on Flonase twice daily each nostril.    Heartburn much improved now on just 1 Carafate daily along with the omeprazole.    Vitamin D deficiency level low normal reasonable however with the pandemic recommend she take 2 daily    Migraine headaches rare has the triptan when needed otherwise mainly just has tension headaches from work    Mood doing pretty well on bupropion no thoughts hurting self or others    Knee osteoarthritis left greater than right hoping to get the gel shot in the left knee and a steroid shot in the right knee in the near future    Overweight exercising regularly but weight is up 6 pounds over the past year encouraged 10% weight loss and continued regular cycling as is    Microhematuria non-smoker will refer to urology reviewed 90% of time no causes found but worth evaluation        Cholesterol profile good 10-year cardiovascular risk less than 4%      Current Outpatient Medications   Medication Sig Dispense Refill    acetaminophen (TYLENOL) 325 MG tablet Take 650 mg by mouth every 6 hours as needed for Pain      omeprazole (PRILOSEC) 20 MG capsule Take 20 mg by mouth 2 times daily (before meals) 30 minutes before meals       buPROPion (WELLBUTRIN XL) 150 MG 24 hr tablet TAKE 1 TABLET BY MOUTH EVERY MORNING 90 tablet 0    sucralfate (CARAFATE) 1 GM tablet Take 1 g by mouth nightly       Calcium 500 MG tablet Take 1 tablet by mouth daily         Cholecalciferol (VITAMIN D3) 1000 UNIT tablet Take 1,000 Units by  mouth every morning         rizatriptan (MAXALT) 5 MG tablet Take 1 tablet (5 mg total) by mouth as needed for Migraine   May repeat in 2 hours if needed.  Max 6 tabs/day. 10 tablet 3     No current facility-administered medications for this visit.         Patient Active Problem List    Diagnosis Date Noted    Current use of proton pump inhibitor 06/04/2019    Hiatal hernia 12/12/2018    Chronic GERD 12/12/2018    Osteoarthritis of right knee 04/28/2017    Primary osteoarthritis of left knee 08/26/2015    Tear of medial cartilage or meniscus of knee, current 12/03/2010    Depression 11/07/2010    Vitamin D deficiency 07/23/2010    Sleep apnea 02/22/2007     [facility] Annotation: Mar 15 2007  9:05PM - Jemina Scahill, [place]: dr [place] --not on cpap        Overweight 04/05/2003             Migraine headache 04/05/2003             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  Past Medical History:   Diagnosis Date    Abdominal pain 07/19/2012    Anxiety 3/12    Benign neoplasm of colon     Calcaneal bursitis 01/14/2012    right    Chronic cholecystitis 0000000    Complication of anesthesia     PONV    Depression     Diverticulosis 11/27/2013    mild    Internal hemorrhoids     Migraine     q2-3 months    Mitral valve prolapse     OA (osteoarthritis)     left knee    Osteoarthritis, knee 12/03/2010    Rosacea 04/05/2003          Ruptured cerebral aneurysm     age 3    Sleep apnea     does not use CPAP    Syncope     d/t migraines    Urinary tract infection     frequent  3 this year    Vertigo         Past Surgical History:   Procedure Laterality Date    CESAREAN SECTION, CLASSIC  '86 '88    spinal    CHOLECYSTECTOMY, LAPAROSCOPIC  11/16/2012    COLONOSCOPY  2012    COLONOSCOPY  11/24/2013    10/2018    COLONOSCOPY  05/19/2019    repeat 04/2024    KNEE ARTHROSCOPY Left 06/19/11    strabismus correction Right 1972, 1993    x2--child + 54 yo    [place]    6  wisdom and B/L bicuspids        Family History   Problem Relation Age of Onset    Breast cancer Mother     Depression Mother     Stroke Father     Heart failure Father     Heart Disease Father     Breast cancer Sister     Heart Disease Brother     Asthma Sister     Ovarian cancer Maternal Grandmother     Colon cancer Maternal Grandfather     Depression Sister     Depression Brother     Depression Daughter     Stroke Paternal Grandmother     Stroke Paternal Grandfather     Substance abuse Sister         Drug - past    Substance abuse Brother         Alcohol - current       Review of systems:    Generally feeling:  Well  Feeling tired:  No  Fever:  No  Chills:  No  Recent weight change: up 6 pounds over the yr  Headache:  Occasional with stress  Vision problems:  No  Hearing loss:  No  Earache:  No  Nasal symptoms:  Some drip constantly--mostly clear and worse in am  Sore throat:  No  Chest pain:  No  Palpitations:  No  Leg pain with exercise (claudication):  No  Shortness of breath:  No  Cough:  No  wheezing:  No  Change in appetite:  No  Heartburn: mostly gone--saw gi  Nausea:  No  Vomiting:  No  Abdominal pain:  No  Change in the stool:  No  Diarrhea:  No  Constipation:  No  Blood in  urine:  No  Increased urinary frequency:  No  Pain during urination:  No  Excessive thirst or fluid intake:  No  Excessive  sweating:  No  Easy bleeding:  No  Easy bruising tendency:  No  Back pain:  Occasional--better now  Generalized muscle aches:  No  Localized joint pain:  Left greater than right  Dizziness:  Rare vertigo recurrence  Lightheadedness:  No  Fainting:  No  Memory loss:  No  Anxiety:  No  Depression:  Okay with work stressors etc--getting better  Itching:  No  Skin rash:  No  Skin lesion:  No    Social history extended    Caffeine [id number]  Tobacco [id number]  Alcohol use:1 a month  Drug [id number]  Sleep habits:okay  Sunscreen [id number]  Ophthalmology:regular  Dental hygiene:good  Dentist:good  Exercise  habits:cycles daily for 35 minutes  Work history: Full-time  Marital history: Married       Vitals:    06/26/19 0925   BP: 136/88   Pulse: 58   Temp:    Weight:    Height:      Body mass index is 39.71 kg/m.    Physical exam:    General appearance: Overweight habitus.  Well-developed, well groomed.  Appears stated age.  No acute distress.  Color good.  Mental status:  Appears alert and oriented.  Affect appropriate.  Skin:  Skin color and turgor normal.  No suspicious lesions, masses, rashes, or ulcerations.  Nails  and hair appear normal.  Head:  Normocephalic  Ears:  External ears without scars, masses, or lesions.  External auditory canal intact, clear, and without lesions.  TMs intact with normal light reflex and landmarks.  Acuity to conversational tones good.  Eyes: extraocular movements intact.  Lids without defect, conjunctiva and sclera appear normal.   Nose:  Nasal mucosa and turbinates pink, septum midline, no lesions.  Mouth:  Teeth in good repair.  Gums pink without lesions.  Normal appearing mucosa, palate, and tongue.  Oropharynx:  Moist without exudate, erythema, or swelling.  Neck:  Symmetric, trachea midline.  Thyroid nontender without enlargement or masses.  Carotids with no bruits bilaterally.  No cervical lymphadenopathy.  Breasts: Deferred to GYN by patient  Chest:  Respirations unlabored.  Chest was symmetric with no masses.  Breath sounds clear bilaterally without wheezes, rubs, rales, or rhonchi.  CV:  Normal S1 and S2 without murmur, rub, gallop, or click.  No edema, clubbing, or cyanosis.   Dorsalis pedis pulses full and symmetrical.  Abdomen:  Abdomen soft with normal bowel sounds.  No guarding or rebound.  No palpable masses or tenderness.  Liver without tenderness or enlargement.  No aortic widening.  GU and rectal deferred to GYN   musculoskeletal:  Muscle tone and strength normal for age, without atrophy or abnormal movements.  Extremities:  Joints with full range of motion, without  tenderness, crepitus, or contracture--except bilateral knees with crepitus with passive range of motion-.    Neuro:  Cranial nerves II through XII grossly intact.  Motor strength symmetrical without obvious weakness.  Superficial sensation intact bilaterally to light touch.  Observed dexterity without ataxia or tremor.          Assessment and plan:         Pleasant female seen here for physical and review of several issues.    Knee osteoarthritis follow-up with orthopedics this week as planned     Heartburn continue recommendations as per GI    Rhinitis we will try Flonase twice a day to each nostril    Migraine headaches rare use of Maxalt generically    Vitamin  D deficiency level good continue supplement may increase to twice daily for the next several months    Reviewed and recommend follow-up with her GYN as she is due    Depression continue bupropion doing well    Overweight encouraged 10% weight loss with decrease calorie intake and continued regular daily aerobic exercise    Counseling and education:      Healthcare proxy:has one per pt  Lab results:reviewed  Diet and bodyweight discussed  Aerobic exercise discussed  Alcohol use discussed  Recreational drug use discussed  Colon cancer screening discussed  Ob/Gyn care discussed--yrly and plans to soon  Osteoporosis prevention and screening discussed  Breast self-exam discussed  Mammogram discussed  STD risk counseling and prevention discussed  Skin cancer awareness and prevention discussed  Cardiac risk factor modification discussed  Motor vehicle safety discussed  Next H&P:31yr

## 2019-06-28 ENCOUNTER — Encounter: Payer: Self-pay | Admitting: Physical Medicine and Rehabilitation

## 2019-06-28 ENCOUNTER — Ambulatory Visit
Payer: BLUE CROSS/BLUE SHIELD | Attending: Physical Medicine and Rehabilitation | Admitting: Physical Medicine and Rehabilitation

## 2019-06-28 VITALS — BP 141/65 | HR 79 | Temp 97.0°F | Ht 64.5 in | Wt 235.0 lb

## 2019-06-28 DIAGNOSIS — M170 Bilateral primary osteoarthritis of knee: Secondary | ICD-10-CM

## 2019-06-28 DIAGNOSIS — M1712 Unilateral primary osteoarthritis, left knee: Secondary | ICD-10-CM

## 2019-06-28 NOTE — Progress Notes (Signed)
Chief complaint:  Knee Pain follow-up    History of Present Illness:  This is a 63 y.o. female who presents with bilateral knee pain.   The patient was originally referred by Dr Marla Roe and Elesa Hacker.     Last seen 1 year ago for left knee viscosupplementation injection.  Patient reports 6 to 7 months of excellent benefit.  She reports 75% pain relief during that time.  She was able to walk and exercise much more easily.  Her pain is now returned.    She reports her left knee pain is located just lateral to her left patella.    She also has right knee pain of lesser severity.    Pain Frequency: Intermittent  Pain Descriptors: Sharp, Other (comment)(stiffness)   Pain Score:   6  Pain Loc: Knee(Pain in both knee more pain in the left knee)      Thinking about considering a left knee replacement.      Works as a Software engineer- sits all day long.       Treatment History:    Physical therapy:   Yes [x]  No []  Helpful [x]  Somewhat Helpful []  Not helpful []     Exercise: exercise bike, resistance exercises  Yes [x]  No []  Helpful []  Somewhat Helpful [x]  Not helpful []     Activity modification:   Yes [x]  No []  Helpful []  Somewhat Helpful [x]  Not helpful []     Acetaminophen:   Yes [x]  No []  Helpful []  Somewhat Helpful [x]  Not helpful []     NSAIDs: advil - can cause GI upset   Yes [x]  No []  Helpful []  Somewhat Helpful [x]  Not helpful []     Corticosteroid injections:  Helpful temporary  Yes [x]  No []  Helpful []  Somewhat Helpful [x]  Not helpful []     Viscosupplementation:  Monovisc  Yes [x]  No []  Helpful [x]  Somewhat Helpful []  Not helpful []     Surgery:  2012 left knee arthroscopy  Yes [x]  No []  Helpful []  Somewhat Helpful [x]  Not helpful []       I reviewed the patient's medical history, medications, allergies, social history, surgical history and family history and updated them as appropriate in the electronic medical record.       Review of Systems:  No fever or chills    Physical Exam:  Pain    06/28/19 0811    PainSc:   6   PainLoc: Knee     Blood pressure 141/65, pulse 79, temperature 36.1 C (97 F), height 1.638 m (5' 4.5"), weight 106.6 kg (235 lb), last menstrual period 03/19/2012.  Body mass index is 39.71 kg/m.  Taken by patient care tech    General: NAD , pleasant and cooperative  HEENT: MMM, no scleral icterus  CVS: good perfusion   Pulm: normal work of breathing   Skin: no rashes   Pysch: normal affect    MSK/neuro:   Left knee without any erythema or lesions.  No significant effusion.  Range of motion 5-1 20.  Positive crepitus  No significant medial or lateral joint line tenderness.  Gait steady and independent.     Assessment:  This is a 63 y.o. female who presents with bilateral knee pain.  -Left knee osteoarthritis- advanced  -Right knee osteoarthritis- moderate    Plan:  Recommend left knee repeat viscosupplementation.  My office will work on prior authorization and scheduling.  She may consider cortisone injection to the right knee at that same visit.  Referral placed to joint replacement  team for discussion of future joint replacement for the left knee.  Patient is aware she needs to lose weight.    Obesity with BMI of 39.71:  Encouraged weight loss.    Follow-up prn    Thank you for allowing [place] to take part in this patient's care and contact us if you have any questions.    Inda Castle, MD

## 2019-06-28 NOTE — Patient Instructions (Signed)
Ortho: [phone]

## 2019-07-07 ENCOUNTER — Other Ambulatory Visit: Payer: Self-pay | Admitting: Primary Care

## 2019-07-11 ENCOUNTER — Encounter: Payer: Self-pay | Admitting: Physical Medicine and Rehabilitation

## 2019-07-11 ENCOUNTER — Ambulatory Visit
Payer: BLUE CROSS/BLUE SHIELD | Attending: Physical Medicine and Rehabilitation | Admitting: Physical Medicine and Rehabilitation

## 2019-07-11 VITALS — BP 143/66 | HR 80 | Temp 95.9°F | Ht 65.0 in | Wt 235.0 lb

## 2019-07-11 DIAGNOSIS — M1711 Unilateral primary osteoarthritis, right knee: Secondary | ICD-10-CM | POA: Insufficient documentation

## 2019-07-11 DIAGNOSIS — M1712 Unilateral primary osteoarthritis, left knee: Secondary | ICD-10-CM | POA: Insufficient documentation

## 2019-07-11 DIAGNOSIS — M170 Bilateral primary osteoarthritis of knee: Secondary | ICD-10-CM | POA: Insufficient documentation

## 2019-07-11 MED ORDER — METHYLPREDNISOLONE ACETATE 40 MG/ML IJ SUSP *I*
80.0000 mg | Freq: Once | INTRAMUSCULAR | Status: AC | PRN
Start: 2019-07-11 — End: 2019-07-11
  Administered 2019-07-11: 80 mg via INTRA_ARTICULAR

## 2019-07-11 MED ORDER — LIDOCAINE HCL 1 % IJ SOLN *I*
1.0000 mL | Freq: Once | INTRAMUSCULAR | Status: AC | PRN
Start: 2019-07-11 — End: 2019-07-11
  Administered 2019-07-11: 1 mL via INTRA_ARTICULAR

## 2019-07-11 MED ORDER — LIDOCAINE HCL 1 % IJ SOLN *I*
1.5000 mL | Freq: Once | INTRAMUSCULAR | Status: AC | PRN
Start: 2019-07-11 — End: 2019-07-11
  Administered 2019-07-11: 1.5 mL via INTRA_ARTICULAR

## 2019-07-11 MED ORDER — LIDOCAINE HCL 1 % IJ SOLN *I*
0.5000 mL | Freq: Once | INTRAMUSCULAR | Status: AC | PRN
Start: 2019-07-11 — End: 2019-07-11
  Administered 2019-07-11: .5 mL via INTRA_ARTICULAR

## 2019-07-11 MED ORDER — SODIUM HYALURONATE 60 MG/3ML IX PRSY *I*
60.0000 mg | PREFILLED_SYRINGE | Freq: Once | INTRA_ARTICULAR | Status: AC | PRN
Start: 2019-07-11 — End: 2019-07-11
  Administered 2019-07-11: 60 mg via INTRA_ARTICULAR

## 2019-07-11 MED ORDER — LIDOCAINE HCL 1 % IJ SOLN *I*
5.0000 mL | Freq: Once | INTRAMUSCULAR | Status: AC | PRN
Start: 2019-07-11 — End: 2019-07-11
  Administered 2019-07-11: 5 mL via INTRA_ARTICULAR

## 2019-07-11 NOTE — Patient Instructions (Signed)
PM&R Post Injection/Procedure Instructions    You received a steroid/cortisone injection today.  This injection has numbing medicine (Lidocaine) and steroid medication.  The numbing medicine may start to work right away and give you immediate pain relief.  However, this will wear off in several hours.  It can take 5-7 days and sometimes up to two weeks for the steroid medication to take effect.  Therefore, please give the injection at least a week to start to notice a benefit.     Notify the office [phone] if you experience any of the following after today’s procedure:  • Fever of 100 degrees Fahrenheit or 38 degrees Celsius or greater  • Excessive swelling or redness at the injection site  • Excessive pain or numbness that lasts longer than 72 hours after your procedure.  • Seek urgent medical care if staff is unavailable.   Care of the injection site:  • You may ice the injection site for local discomfort.  Ice should be covered with a cloth - Never place directly on skin.  • Apply ice for 15 minutes on then off for 1 hour.  Repeat as needed.  • Take band aid off any time.  • Do not submerge the area in water for 24 hours - showering is OK.  • Avoid strenuous activity for the first two days after injection.  Diabetic patients:  • Your blood sugar can be elevated for about a week due to the steroid used in the injection, so check your blood sugar regularly.  • Call your primary care physician if it goes above 300.  Common side effects of injections:  • Bruising and minor swelling at site  • Increased pain  • Numbness for the first 6-8 hours after injection - this should subside or return to baseline  • Sometimes the medication can cause skin pigment lightening or changes.      PM&R Post Injection/Procedure Instructions    You received a viscosupplementation ("gel") injection today.  This injection contains hyaluronic acid gel.  If numbing medicine (Lidocaine) was used, it may start to work right away and  give you immediate pain relief.  However, this will wear off in several hours.  It can take 1-3 months for the gel to take full effect.  Therefore, please give the injection until your 3 month follow up to determine how much it helped.    Notify the office [phone] if you experience any of the following after today’s procedure:  • Fever of 100 degrees Fahrenheit or 38 degrees Celsius or greater  • Excessive swelling or redness at the injection site  • Excessive pain or numbness that lasts longer than 72 hours after your procedure.  • Seek urgent medical care if staff is unavailable.   Care of the injection site:  • You may ice the injection site for local discomfort.  Ice should be covered with a cloth - Never place directly on skin.  • Apply ice for 15 minutes on then off for 1 hour.  Repeat as needed.  • Take band aid off any time  • Do not submerge the area in water for 24 hours - showering is OK  • Avoid strenuous activity for the next 48 hours (no hiking, running, or tennis)  Common side effects of injections:  • Bruising and minor swelling at site  • Increased pain  • Numbness for the first 6-8 hours after injection if numbing medicine was used - this   should subside or return to baseline

## 2019-07-11 NOTE — Procedures (Signed)
Large Joint Aspiration/Injection Procedure: R knee intra - articular    Date/Time: 07/11/2019  8:40 AM EST  Consent given by: patient  Site marked: site marked  Timeout: Immediately prior to procedure a time out was called to verify the correct patient, procedure, equipment, support staff and site/side marked as required     Procedure Details    Location: knee - R knee intra - articular  Preparation: The site was prepped using the usual aseptic technique.  Ultrasound guidance:  Ultrasound was utilized to improve needle visualization, injection accuracy, and anatomic localization.    Anesthetics administered: 1 mL lidocaine hcl 1 %; 1.5 mL lidocaine hcl 1 %; 0.5 mL lidocaine hcl 1 %  Intra-Articular Steroids administered: 80 mg methylPREDNISolone acetate 40 MG/ML  Dressing:  A dry, sterile dressing was applied.  Patient tolerance: patient tolerated the procedure well with no immediate complications      Patient Name: [name]  MRN: [id number]  Date: .07/11/2019  Procedure : Ultrasound guided injection  Laterality: Right Body part: Knee  Examiner : Inda Castle, MD  CPT Code : 20611  Indication : Reduction of procedural pain    Technique: The patient is placed in a supine position.  A bolster is placed behind the knee.  The site was appropriately marked, and a time-out was performed prior to the procedure.  The skin is prepped with chlorhexadine in the usual fashion. Linear transducer is prepped with chlorhexadine and utilized to visualize the anterior knee.  Linear transducer was used to visualize the joint via a lateral window with sterile ultrasound gel. Ultrasound imagery of the injection site in long and short axes were obtained and appropriated images were labeled, saved and permanently archived.    Findings : A limited diagnostic scan of the joint was done. Small fluid collection was seen. No disruption of tendons.     Procedure: Risks, benefits, and alternatives to injection were discussed  with the patient.  [name] specifically acknowledges the possibility of pain, bleeding, infection, color change of skin, systemic reaction and lack of clinical improvement.  Verbal informed consent obtained.  Pre-Procedure Check was done:  Correct patient- Correct procedure - Ultrasound. Correct site marked. Correct patient position..  The injection is done on the right.  Local anesthetic with 1% lidocine 5 mL was given.  Using ultrasound guidance a 20 gauge 1.5 inch needle was inserted through the skin and into the suprapatellar pouch via a superior-lateral portal with avoidance of bony prominences, blood vessels and other vulnerable structures.  Needle tip joint space verification was done with the ultrasound then 2cc of 40mg /cc depomedrol and 3cc 1% lidocaine was injected, slowly but with consistent pressure, with free flow of fluid into the joint visualized.  Joint distention visualized.  The needle is removed, skin is wiped with alcohol, hemostasis is achieved and a bandage is applied.  The patient tolerated the procedure well without difficulty.  Warning signs and routine aftercare are discussed with the patient.      Impression:  Ultrasound Real time guidance Right Knee Intra-Articular Injection for Right Knee osteoarthritis    Inda Castle, MD

## 2019-07-11 NOTE — Procedures (Signed)
Large Joint Aspiration/Injection Procedure: L knee intra - articular    Date/Time: 07/11/2019  8:40 AM EST  Consent given by: patient  Site marked: site marked  Timeout: Immediately prior to procedure a time out was called to verify the correct patient, procedure, equipment, support staff and site/side marked as required     Procedure Details    Location: knee - L knee intra - articular  Preparation: The site was prepped using the usual aseptic technique.  Ultrasound guidance:  Ultrasound was utilized to improve needle visualization, injection accuracy, and anatomic localization.    Anesthetics administered: 5 mL lidocaine hcl 1 %  Viscosupplementation & other medications administered: 60 mg sodium hyaluronate 60 MG/3ML  Dressing:  A dry, sterile dressing was applied.  Patient tolerance: patient tolerated the procedure well with no immediate complications      Patient Name: [name]  MRN: [id number]  Date: 07/11/2019  Procedure : Ultrasound guided injection  Laterality: Left  Body part: Knee  Examiner : Inda Castle, MD  CPT Code : 20611  Indication : Reduction of procedural pain    Technique: The patient is placed in a supine position.  A bolster is placed behind the knee.  The site was appropriately marked, and a time-out was performed prior to the procedure.  The skin is prepped with chlorhexadine in the usual fashion. Linear transducer is prepped with chlorhexadine and utilized to visualize the anterior knee.  Linear transducer was used to visualize the joint via a lateral window with sterile ultrasound gel. Ultrasound imagery of the injection site in long and short axes were obtained and appropriated images were labeled, saved and permanently archived.    Findings : A limited diagnostic scan of the joint was done. Small-moderate fluid collection was seen. No disruption of tendons.     Procedure: Risks, benefits, and alternatives to injection were discussed with the patient.  [name] specifically acknowledges the possibility of pain, bleeding, infection, color change of skin, systemic reaction and lack of clinical improvement.  Verbal informed consent obtained.  Pre-Procedure Check was done:  Correct patient- Correct procedure - Ultrasound. Correct site marked. Correct patient position..  The injection is done on the left.  Local anesthetic with 1% lidocine 5 mL was given.  Using ultrasound guidance a 20 gauge 1.5 inch needle was inserted through the skin and into the suprapatellar pouch via a superior-lateral portal with avoidance of bony prominences, blood vessels and other vulnerable structures.  Needle tip joint space verification was done with the ultrasound then 1 ampule of Durolane was injected, slowly but with consistent pressure, with free flow of fluid into the joint visualized.  Joint distention visualized.  The needle is removed, skin is wiped with alcohol, hemostasis is achieved and a bandage is applied.  The patient tolerated the procedure well without difficulty.  Warning signs and routine aftercare are discussed with the patient.      Impression:  Ultrasound Real time guidance Left Knee Intra-Articular Injection for Left Knee osteoarthritis.    Inda Castle, MD

## 2019-07-13 ENCOUNTER — Other Ambulatory Visit: Payer: Self-pay | Admitting: Urology

## 2019-07-13 DIAGNOSIS — Z0189 Encounter for other specified special examinations: Secondary | ICD-10-CM

## 2019-07-14 ENCOUNTER — Ambulatory Visit: Payer: BLUE CROSS/BLUE SHIELD | Admitting: Urology

## 2019-07-14 ENCOUNTER — Encounter: Payer: Self-pay | Admitting: Urology

## 2019-07-14 VITALS — Ht 65.0 in | Wt 235.0 lb

## 2019-07-14 DIAGNOSIS — R3129 Other microscopic hematuria: Secondary | ICD-10-CM | POA: Insufficient documentation

## 2019-07-14 DIAGNOSIS — Z0189 Encounter for other specified special examinations: Secondary | ICD-10-CM

## 2019-07-14 HISTORY — DX: Other microscopic hematuria: R31.29

## 2019-07-14 LAB — POCT URINALYSIS DIPSTICK
Blood,UA POCT: NEGATIVE
Glucose,UA POCT: NORMAL mg/dL
Ketones,UA POCT: NEGATIVE mg/dL
Leuk Esterase,UA POCT: NEGATIVE
Lot #: 44399003
Nitrite,UA POCT: NEGATIVE
PH,UA POCT: 8 (ref 5–8)
Protein,UA POCT: NEGATIVE mg/dL
Specific gravity,UA POCT: 1.005 (ref 1.002–1.030)

## 2019-07-14 NOTE — Progress Notes (Signed)
Urology Clinic Note:  07/14/2019     Chief complaint: Hematuria    History of present illness: Victoria Victoria is a 64 y.o. female with a history of OSA, depression, viatamin D deficiency, Gerd. She presents with hematuria. She has no chemical exposures. She does have a history of UTIs. Last UTI was a year ago.     No smoking history    No family history of bladder, kidney cancer    Medications:   Current Outpatient Medications   Medication    buPROPion (WELLBUTRIN XL) 150 MG 24 hr tablet    acetaminophen (TYLENOL) 325 MG tablet    fluticasone (FLONASE) 50 MCG/ACT nasal spray    omeprazole (PRILOSEC) 20 MG capsule    sucralfate (CARAFATE) 1 GM tablet    rizatriptan (MAXALT) 5 MG tablet    Calcium 500 MG tablet    Cholecalciferol (VITAMIN D3) 1000 UNIT tablet     No current facility-administered medications for this visit.        Allergies:   Allergies   Allergen Reactions    Codeine Nausea And Vomiting    Gabapentin Other (See Comments)     Loopy      Lexapro [Escitalopram] Other (See Comments)     Fatigue    Wellbutrin [Bupropion] Other (See Comments)     Hair loss       Past medical history:   Past Medical History:   Diagnosis Date    Abdominal pain 07/19/2012    Anxiety 3/12    Benign neoplasm of colon     Calcaneal bursitis 01/14/2012    right    Chronic cholecystitis 0000000    Complication of anesthesia     PONV    Depression     Diverticulosis 11/27/2013    mild    Internal hemorrhoids     Migraine     q2-3 months    Mitral valve prolapse     OA (osteoarthritis)     left knee    Osteoarthritis, knee 12/03/2010    Rosacea 04/05/2003          Ruptured cerebral aneurysm     age 65    Sleep apnea     does not use CPAP    Syncope     d/t migraines    Urinary tract infection     frequent  3 this year    Vertigo        Past surgical history:   Past Surgical History:   Procedure Laterality Date    CESAREAN SECTION, CLASSIC  '86 '88    spinal    CHOLECYSTECTOMY, LAPAROSCOPIC  11/16/2012     COLONOSCOPY  2012    COLONOSCOPY  11/24/2013    10/2018    COLONOSCOPY  05/19/2019    repeat 04/2024    KNEE ARTHROSCOPY Left 06/19/11    strabismus correction Right 1972, 1993    x2--child + 52 yo    [place]    4 wisdom and B/L bicuspids       Family history:   Family History   Problem Relation Age of Onset    Breast cancer Mother     Depression Mother     Stroke Father     Heart failure Father     Heart Disease Father     Breast cancer Sister     Heart Disease Brother     Asthma Sister     Ovarian cancer Maternal Grandmother  Colon cancer Maternal Grandfather     Depression Sister     Depression Brother     Depression Daughter     Stroke Paternal Grandmother     Stroke Paternal Grandfather     Substance abuse Sister         Drug - past    Substance abuse Brother         Alcohol - current       Social History:   Social History     Socioeconomic History    Marital status: Married     Spouse name: Not on file    Number of children: Not on file    Years of education: Not on file    Highest education level: Not on file   Occupational History    Not on file   Tobacco Use    Smoking status: Never Smoker    Smokeless tobacco: Never Used   Substance and Sexual Activity    Alcohol use: Yes     Types: 1 Glasses of wine per week     Comment: 2 drinks/month    Drug use: No    Sexual activity: Yes     Partners: Male     Birth control/protection: None   Social History Narrative    She is employed as a Software engineer. She is married and lives with her husband Herbie [place]).  They have 2 children.  She exercises using a recumbent bike.  She enjoys golfing.          The patient denies:  Systemic: Denies recent weight loss or gain or fatigue.  Eyes: Denies change in vision  ENT: Denies hearing problems, or loss. Denies swollen glands or stiff neck.  Chest: Denies recent cold, flu or upper respiratory illness. Denies cough or dyspnea.  Heart: Denies recent heart trouble, chest  pain or palpitations.  GI: Denies constipation, diarrhea, acid reflux or bloody stool.  GU: per HPI  Musculoskeletal: Denies any muscle or joint pain stiffness or arthritis.  Skin: Denies rash, ulcers or skin problems.  Neuro: Denies numbness, tingling, dizziness or headaches.  Pysch: Denies depression, anxiety or psychosis.  Endocrine: Denies , hypothyroidism or hyperthyroidism.  Hematology: Denies recent bleeding, bruising or anemia.  Allergy/Immunological: Denies runny nose, allergic rhinitis, or itching eyes.    Vital signs: Last menstrual period 03/19/2012.    Physical examination:  GENERAL:  No acute distress, well developed, well nourished  NEUROLOGIC:  Oriented to person, place, time, and situation  PSYCHIATRIC:  Normal mood and affect  HEENT:  Normocephalic, atraumatic.  Oropharynx clear.Marland Kitchen  SKIN:  Normal color, turgor, texture, hydration  BACK/ORTHO:  No costovertebral angle tenderness.  No tenderness of the axial skeleton.  No obvious back deformities.  ABDOMINAL:  Abdomen soft,nontender, nondistended, and without masses;    EXTREMITIES:  Without cyanosis, clubbing, or edema. Calves nontender    Patient needs formal workup for hematuria.  Some new guidelines internationally now state microhematuria should be worked up with even 1 UA showing greater or equal to 3 red cells per high-power field. Gross hematuria should have CT scan with and without contrast, urine cytology and cystoscopy.  microhematuria, especially in low risk patients , can have ultrasound instead of CT scan. Discussed 20% chance of finding significant GU pathology with gross hematuria, 8% chance with microhematuria. Will obtain  Imaging  first in case any pathology found that warrants cystoscopy under anesthesia. Discussed risks and benefits of CT scan and cystoscopy.     Assessment  1. Microhematuria    Plan  1. CT scan -hematuria protocol  2. Urine cytology/culture  3. Cystoscopy      Thank you for allowing me to participate in [facility] care.    Alda Berthold, MD 07/14/2019 6:46 AM

## 2019-07-18 ENCOUNTER — Other Ambulatory Visit: Payer: Self-pay | Admitting: Primary Care

## 2019-07-18 MED ORDER — FLUTICASONE PROPIONATE 50 MCG/ACT NA SUSP *I*
1.0000 | Freq: Two times a day (BID) | NASAL | 3 refills | Status: AC
Start: 2019-07-18 — End: 2020-12-06

## 2019-07-24 ENCOUNTER — Other Ambulatory Visit
Admission: RE | Admit: 2019-07-24 | Discharge: 2019-07-24 | Disposition: A | Discharge status: Home / Self Care | Payer: BLUE CROSS/BLUE SHIELD | Source: Ambulatory Visit | Attending: Urology | Admitting: Urology

## 2019-07-24 DIAGNOSIS — R3129 Other microscopic hematuria: Secondary | ICD-10-CM | POA: Insufficient documentation

## 2019-07-24 LAB — BASIC METABOLIC PANEL
Anion Gap: 11 (ref 7–16)
CO2: 28 mmol/L (ref 20–28)
Calcium: 9.7 mg/dL (ref 8.6–10.2)
Chloride: 100 mmol/L (ref 96–108)
Creatinine: 0.89 mg/dL (ref 0.51–0.95)
GFR,Black: 81 *
GFR,Caucasian: 70 *
Glucose: 91 mg/dL (ref 60–99)
Lab: 12 mg/dL (ref 6–20)
Potassium: 4.6 mmol/L (ref 3.3–5.1)
Sodium: 139 mmol/L (ref 133–145)

## 2019-07-31 ENCOUNTER — Ambulatory Visit
Admission: RE | Admit: 2019-07-31 | Discharge: 2019-07-31 | Disposition: A | Discharge status: Home / Self Care | Payer: BLUE CROSS/BLUE SHIELD | Source: Ambulatory Visit | Attending: Urology | Admitting: Urology

## 2019-07-31 DIAGNOSIS — R3129 Other microscopic hematuria: Secondary | ICD-10-CM | POA: Insufficient documentation

## 2019-07-31 MED ORDER — IOHEXOL 350 MG/ML (OMNIPAQUE) IV SOLN *I*
1.0000 mL | Freq: Once | INTRAVENOUS | Status: AC
Start: 2019-07-31 — End: 2019-07-31
  Administered 2019-07-31: 150 mL via INTRAVENOUS

## 2019-08-08 ENCOUNTER — Other Ambulatory Visit: Payer: Self-pay | Admitting: Gastroenterology

## 2019-08-09 ENCOUNTER — Encounter: Payer: Self-pay | Admitting: Gastroenterology

## 2019-08-10 ENCOUNTER — Encounter: Payer: Self-pay | Admitting: Primary Care

## 2019-08-24 ENCOUNTER — Other Ambulatory Visit: Payer: Self-pay | Admitting: Adult Health

## 2019-08-24 DIAGNOSIS — M1712 Unilateral primary osteoarthritis, left knee: Secondary | ICD-10-CM

## 2019-08-31 ENCOUNTER — Other Ambulatory Visit: Payer: Self-pay | Admitting: Urology

## 2019-08-31 DIAGNOSIS — R3989 Other symptoms and signs involving the genitourinary system: Secondary | ICD-10-CM

## 2019-08-31 NOTE — Progress Notes (Signed)
Cystoscopy Procedure Note    Indications and Pre-procedure Diagnosis: Hematuria  Post-oprocedure Diagnosis: same  Surgeon: Alda Berthold, MD    Anesthesia: no  Procedure Details:   The risks, benefits, complications, treatment options, and expected outcomes were discussed with the patient. The patient concurred with the proposed plan, giving informed consent.  Cystoscopy was performed today, using sterile technique. The patient was placed in the lithotomy position, prepped  and draped in the usual sterile fashion. Cystoscopy was carried out with a flexible cytoscope with video guidance.  Findings:  Anterior urethra: normal without strictures and without scarring.   Bladder: Normal mucosa, no lesions.  Right ureteral orifice was in normal position and normal appearance. Left ureteral orifice was in normal position and normal appearance. Clear efflux was noted from both orifice.  Specimens: bladder wash and urine sent for cytology  Disposition: To home     CT urogram  Kidneys and Collecting Systems: 1.2 cm right renal upper pole cyst. No renal calculi or renal masses.  IMPRESSION:  No evidence of renal calculi or renal masses.    Negative hematuria work up

## 2019-09-01 ENCOUNTER — Encounter: Payer: Self-pay | Admitting: Urology

## 2019-09-01 ENCOUNTER — Ambulatory Visit: Payer: BLUE CROSS/BLUE SHIELD | Attending: Urology | Admitting: Urology

## 2019-09-01 VITALS — BP 156/83 | HR 77 | Ht 65.0 in | Wt 235.0 lb

## 2019-09-01 DIAGNOSIS — N281 Cyst of kidney, acquired: Secondary | ICD-10-CM | POA: Insufficient documentation

## 2019-09-01 DIAGNOSIS — R3989 Other symptoms and signs involving the genitourinary system: Secondary | ICD-10-CM | POA: Insufficient documentation

## 2019-09-01 LAB — POCT URINALYSIS DIPSTICK
Glucose,UA POCT: NORMAL mg/dL
Ketones,UA POCT: NEGATIVE mg/dL
Leuk Esterase,UA POCT: NEGATIVE
Lot #: 44399003
Nitrite,UA POCT: NEGATIVE
PH,UA POCT: 8 (ref 5–8)
Protein,UA POCT: NEGATIVE mg/dL
Specific gravity,UA POCT: 1.015 (ref 1.002–1.030)

## 2019-09-01 NOTE — Procedures (Signed)
PRE-PROCEDURE TIME OUT    What procedure is being performed? cysto  Correct procedure? yes  Correct Patient (use 2 identifiers)? yes  Correct Site? yes  Site Marked? N/A  Correct Side? N/A  Correct patient position? yes  Consent verified? yes  Tests results available? N/A  Imaging results available? N/A  List participants involved in time out. Dr. Alda Berthold, Thurnell Garbe LPN  Availability of correct instruments and any special equipment or requirements? yes

## 2019-09-01 NOTE — Patient Instructions (Signed)
Cystoscopy Frequently Asked Questions    Indication:  To evaluate blood in the urine, to assess possible causes of incontinence.    Do we use a numbing agent?   We may use 2% lidocaine jelly, depending on your diagnosis.  It is a gel that is placed into the urethra.  No needle is used.  The numbing effect will last 1 to 2 hours.  Patient will still be able to feel the urge to urinate.    Should I eat or drink the day of the procedure?  Yes!  Please eat and drink as normal the day of the procedure.  Being well hydrated will help the doctor better visualize the ureter and kidney function.    Are there any medications I should stop taking before the procedure?  No.  You may take all your medications.    How long will the procedure take?  You should plan to be in the office for at least an hour to allow time for paperwork and prep.  From the time the doctor enters the room, the procedure itself takes between 5 to 15 minutes to complete.    What will happen during my procedure?  You will be asked to provide a urine specimen prior to the procedure to ensure there is no current infection.  If you are found to have an infection, the procedure may be rescheduled at the Doctors discretion.  You will be brought back to a procedure room, vital signs will be taken, and you will be asked to sign a consent form.  You will then be asked to undress from the waist down, and you will lie down on the procedure table.  Your genitals will be cleansed with an antibacterial soap, and the lidocaine gel will be instilled into the urethra.  The doctor will insert the cystoscope into the bladder via the urethra.  He will be able to visualize the urethra, the bladder and the ureters.  A sample of the fluid taken from the bladder will be sent to pathology to look for any abnormal cells.  The result takes 7-10 days to come back.  We will either call you with the result, or your doctor may have you return to the office to discuss the results of  all tests.    What kind of scope is used?  We use a flexible cystoscope in our office.    Will it hurt?  Most people feel a large amount of pressure, a strong urge to urinate and a little burning along the urethra, although the experience is is different for everyone.      Will I have antibiotics afterward?  This will be at the discretion of your doctor.    How long before I may resume sexual activity?  The same day.      Will I have any after effects?  You may experience burning with urination and a small amount of blood in the urine for a few days afterwards.  This is normal.  Drink plenty of fluids for a few days to keep the urinary tract flushed out.    Contact your Doctor if you develop the following:  · Excessive pain  · Prolonged excessive bleeding or temperature greater then 101 degrees fahrenheit     Will I need to be out of work or restrict my activities for any length of time afterward?  You may return to all normal activities the same day.

## 2019-09-01 NOTE — Addendum Note (Signed)
Addended byGwenith Spitz on: 09/01/2019 08:21 AM     Modules accepted: Orders

## 2019-09-04 ENCOUNTER — Ambulatory Visit: Payer: BLUE CROSS/BLUE SHIELD | Admitting: Orthopedic Surgery

## 2019-09-04 ENCOUNTER — Ambulatory Visit
Admission: RE | Admit: 2019-09-04 | Discharge: 2019-09-04 | Disposition: A | Discharge status: Home / Self Care | Payer: BLUE CROSS/BLUE SHIELD | Source: Ambulatory Visit | Attending: Adult Health | Admitting: Adult Health

## 2019-09-04 ENCOUNTER — Encounter: Payer: Self-pay | Admitting: Orthopedic Surgery

## 2019-09-04 VITALS — BP 150/98 | Ht 64.75 in | Wt 227.2 lb

## 2019-09-04 DIAGNOSIS — M170 Bilateral primary osteoarthritis of knee: Secondary | ICD-10-CM | POA: Insufficient documentation

## 2019-09-04 DIAGNOSIS — M1712 Unilateral primary osteoarthritis, left knee: Secondary | ICD-10-CM | POA: Insufficient documentation

## 2019-09-04 DIAGNOSIS — M25562 Pain in left knee: Secondary | ICD-10-CM

## 2019-09-04 DIAGNOSIS — G8929 Other chronic pain: Secondary | ICD-10-CM

## 2019-09-04 NOTE — Progress Notes (Signed)
Dr. Christiana Fuchs TKA  PG:[id number], [phone], Depuy equip 2 hours, anes pending    Threasa Alpha, RN CNL

## 2019-09-04 NOTE — Patient Instructions (Signed)
I have given your information to our Orthopedic Total Joint Replacement Nurse Navigator.      Please call the Nurse Navigators to schedule. Their number is [phone].    Please go to [email or web address] for more information about total joint replacement.    To contact Dr Launa Flight Office please use Mychart or call [phone]. During the pandemic you will likely get voicemail but messages will be regularly checked.

## 2019-09-04 NOTE — Progress Notes (Signed)
ORT ADULT NEW KNEE    [name] presents for a new patient visit with complaints of left knee pain. Symptoms began years ago. Complaining of pain which is exacerbated by activity and relieved by rest. There is pain at rest. Pain is up to a 10 out of 10 when at its worst.  There is moderate swelling, locking, instability. Modalities tried include injection, medication and therapy. Prior left knee arthroscopy. Xrays have been taken.  There is a history of previous similar problems.     Past Medical History:   Past Medical History:   Diagnosis Date    Abdominal pain 07/19/2012    Anxiety 3/12    Benign neoplasm of colon     Calcaneal bursitis 01/14/2012    right    Chronic cholecystitis 0000000    Complication of anesthesia     PONV    Depression     Diverticulosis 11/27/2013    mild    Internal hemorrhoids     Migraine     q2-3 months    Mitral valve prolapse     OA (osteoarthritis)     left knee    Osteoarthritis, knee 12/03/2010    Rosacea 04/05/2003          Ruptured cerebral aneurysm     age 79    Sleep apnea     does not use CPAP    Syncope     d/t migraines    Urinary tract infection     frequent  3 this year    Vertigo          Past Surgical History:   Past Surgical History:   Procedure Laterality Date    CESAREAN SECTION, CLASSIC  '86 '88    spinal    CHOLECYSTECTOMY, LAPAROSCOPIC  11/16/2012    COLONOSCOPY  2012    COLONOSCOPY  11/24/2013    10/2018    COLONOSCOPY  05/19/2019    repeat 04/2024    KNEE ARTHROSCOPY Left 06/19/11    strabismus correction Right 1972, 1993    x2--child + 44 yo    [place]    4 wisdom and B/L bicuspids         Allergies:   Allergies   Allergen Reactions    Codeine Nausea And Vomiting    Gabapentin Other (See Comments)     Loopy      Lexapro [Escitalopram] Other (See Comments)     Fatigue    Wellbutrin [Bupropion] Other (See Comments)     Hair loss        Medications:   Current Outpatient Medications:     famotidine (PEPCID)  20 MG tablet, Take 20 mg by mouth 2 times daily as needed for Heartburn, Disp: , Rfl:     fluticasone (FLONASE) 50 MCG/ACT nasal spray, Spray 1 spray into nostril 2 times daily, Disp: 48 g, Rfl: 3    buPROPion (WELLBUTRIN XL) 150 MG 24 hr tablet, TAKE 1 TABLET BY MOUTH EVERY MORNING, Disp: 90 tablet, Rfl: 0    acetaminophen (TYLENOL) 325 MG tablet, Take 650 mg by mouth every 6 hours as needed for Pain, Disp: , Rfl:     omeprazole (PRILOSEC) 20 MG capsule, Take 20 mg by mouth daily (before breakfast) 30 minutes before meals , Disp: , Rfl:     sucralfate (CARAFATE) 1 GM tablet, Take 1 g by mouth nightly , Disp: , Rfl:     rizatriptan (MAXALT) 5 MG tablet, Take  1 tablet (5 mg total) by mouth as needed for Migraine   May repeat in 2 hours if needed.  Max 6 tabs/day., Disp: 10 tablet, Rfl: 3    Calcium 500 MG tablet, Take 1 tablet by mouth daily   , Disp: , Rfl:     Cholecalciferol (VITAMIN D3) 1000 UNIT tablet, Take 1,000 Units by mouth every morning   , Disp: , Rfl:      Family History:   Family History   Problem Relation Age of Onset    Breast cancer Mother     Depression Mother     Stroke Father     Heart failure Father     Heart Disease Father     Breast cancer Sister     Heart Disease Brother     Asthma Sister     Ovarian cancer Maternal Grandmother     Colon cancer Maternal Grandfather     Depression Sister     Depression Brother     Depression Daughter     Stroke Paternal Grandmother     Stroke Paternal Grandfather     Substance abuse Sister         Drug - past    Substance abuse Brother         Alcohol - current        Social History: Office work, 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, rides exercise bike, gave up sports due to knees     Review of Symptoms: Review of Systems    Constitutional        []  Fever        []  Chills        []  Loss of Appetite         []  Unexplained Weight Loss        []  Unusual Tiredness        []  Night Pains    Ears, Nose, Mouth, Throat        []  Dizziness        []  Mouth Sores        []   Sore Throat    Cardiovascular        []  High Blood Pressure        []  Angina        []  Trouble Breathing         []  Trouble Breathing When Flat        []  Ankle Swelling        []  Heart Attack        []  Congestive Heart Failure        []  Mitral Valve Prolapse        []  Abnormal Heart Rhythm        []  Heart Murmur    Respiratory        []  Heavy Cough        []  Cough Up Sputum        []  Cough Up Blood         []  Pneumonia        []  Asthma    Neurological        []  Fainting        []  Epilepsy        []  Stroke         []  Memory Problems    Eyes        []  Abnormal Vision        []  Glasses or Contacts    Gastrointestinal        []   Nausea        []  Stomach Pain        []  Vomiting         []  Vomiting Blood        []  Ulcers        []  Hiatal Hernia        []  Constipation        []  Diarrhea        []  Change in Bowel Habits        []  Blood in Stool        []  Black, Tarry Stools        [x]  Reflux        []  GERD    Genitourinary        []  Painful Urination        []  Impotence        []  One Kidney         []  Kidney Failure        []  Dialysis        []  Kidney Transplant        []  Change in Bladder Habits        []  Incontinence        []  Prostate Cancer    Musculoskeletal        []  Painful Joints        []  Swollen Joints        []  Redness of Joints        []  Joint Infection        []  Bone Infection        []  Gout        []  Osteoarthritis        []  Rheumatoid Arthritis        []  Ankylosing Spondylitis        []  Osteoporosis    Skin        []  Skin Sores        []  Skin Rash        []  Itching        []  Skin Cancer    Psychiatric        []  Depression        []  Anxiety    Endocrine        []  Diabetes        []  Thyroid Problem    Hematologic/Lymphatic        []  Unusual Bleeding        []  Easy Bruising        []  Mass        []  Swollen Glands        []  Anemia        []  Infection        []  Immunodeficiency        []  Hepatitis        []  Cancer       Exam:      The left knee has varus  alignment.  There is moderate swelling. A Bakers cyst  is  present.  The knee is tender to palpation over the medial side.  There isinstability with varus and valgus stresses. Anterior Drawers negative. Posterior Drawersnegative. Lachmans test is negative. McMurrays test is positive.  Range of motion is 3-105. There is crepitus with range of motion. There is pain with range of motion. With ambulation gait was antalgic.     Radiographs were obtained and personally reviewed. These show severe degenerative change.     Impression:  Left knee.            Plan:    Medical Decision Making:      High Problem Complexity: This person has severe, end stage chronic arthritis of the knee joint. The pain and disability, as a result of this arthritis, persist in spite of optimized non-operative management. Ongoing suffering with these symptoms poses a significant threat to ongoing satisfactory bodily function.    Extensive Data Order/Review:  X Rays, including AP, Lateral, Tunnel and Sunrise views of the involved knee, have been ordered and independently reviewed. These have been compared with prior x rays, also independently reviewed, where available.    High Morbidity Risk Discussion:  Based on the pain and disability profile, along with the likely failure of ongoing non-operative management, I have recommended consideration of total knee replacement.(CPT [phone]; ALLOW 2 HOURS) Depuy equipment. Implants rationale addressed.  We also discussed patient related risk factors such as BMI and pertinent co-existing medical conditions. Patient acknowledges complications may occur in spite of a technically properly performed procedure.    Scheduling arranged through Orthopedic Nurse Navigator pool.  Call [phone] for scheduling.    .    Orders Next Visit:   Answers for HPI/ROS submitted by the patient on 08/29/2019  What is your goals for today's visit?: To discuss knee replacement  Date of onset: : 11/29/2010  Was this the result of an injury?: No  What is your pain level?: 8/10  Please  describe the quality of your pain: : aching, instability, popping, shooting, stiffness, tenderness  What diagnostic workup have you had for this condition?: X-ray  What treatments have you tried for this condition?: acetominophen, aspirin, corticosteroids, home exercise program, NSAIDs, surgery  Progression since onset: : gradually worsening  Is this a work related condition? : No  Current work status: : usual activities  Fever: No  Chills: No  Weight loss: No  Malaise/fatigue: No  Rash: No  Dry skin: No  Itching: No  Wound : No  Headache: Yes  Hearing loss: No  Sore throat: No  Dental problem: No  Blindness: No  Visual disturbance: No  Chest pain: No  Palpitations: No  Leg cramps with exercise: No  Leg swelling: No  Fainting: No  Heartburn: Yes  Nausea: No  Vomiting: No  Diarrhea: No  Constipation: No  Blood in stool: No  Cough : No  Apnea: Yes  Shortness of breath: No  Frequency: No  Urgency: No  Blood in urine: Yes  Incomplete emptying: No  Falls: No  Neck pain: No  Back pain: No  Joint swelling: No  Muscle spasms: No  Muscle cramps: No  Easy to bruise/bleed: No  Frequent infections: No  Numbness: No  Tingling: No  Seizures: No  Loss of consciousness: No  Weakness: No  Depression: Yes  Nervous/anxious: No  Memory loss: No

## 2019-09-05 LAB — MEDICAL CYTOLOGY

## 2019-09-11 ENCOUNTER — Telehealth: Payer: Self-pay

## 2019-09-11 NOTE — Telephone Encounter (Signed)
Ortho Nurse Navigator Initial Intake Assessment Interview  Pt. Cleared to be scheduled  Yes     sent to scheduler  Yes        Met with pt  via phone interview     yes                      [facility] book to be sent via scheduler after surgical date confirmed -scheduler will send out.    Must have support person for d/c  to home   yes             D/C PLAN to Home YES                       equipment :   cane      walker               commode   if SDS must obtain equip before hand   Home Situation     Story,   #  2 Steps to get in home with rail  No   Plan to set up first/main floor set up with recliner, sofa or bed Yes      With bathroom  Yes  first floor  PCP must be seen before surgery   Specialist to see prior to surgery Other - PCP       Pre op anticoagulant therapy-medication                                                                                  BMI    DM   No   meds          recent A1C           or blood sugars        PT EDUCATION pre,op peri op, post op Total Joint Replacement       sent to patient via mychart   yes           emailed or mailed if no mychart  ON LINE video  LINK   [email or web address]      Ortho Scheduler will call you to give you a surgical date when we are booking  for your surgeon    pt will set up Primary care MD appt for at least 30 days before surgery date    Pt will also set up any specialist clearance appointments as directed by your surgeon or Nurse Navigator    If your Doctor is part of the [facility] erecord system please have them place a note in your chart.      IF outside of [facility] -have clearance letters faxed to [phone]     ?  Pre Op staff to call pt  for [facility] on site visit.21-30 days before surgery date     ?  Covid 19 Surgical patients will still need to have COVID Testing prior to surgery.   Our OR Secretaries will schedule the cases and our Presurgical Screening Office will  contact the patient to arrange for the testing. It is extremely important the phone number you provide is the  best phone number to reach the patient. Our Presurgical Screening team will contact the patient anywhere from 7 to 5 days prior to their surgery to arrange and give them instructions on the testing  Pre op  to call you prior to day of surgery between 130pm and 5pm for your OR time.  Plan on being here 2 1/2 hour prior to surgery time  ?  Please isolate in your home and within your bubble- 2 weeks prior to surgery. If working consider the environment that you are in and is it high risk for being exposed to people with Covid 19. Practice social distancing, wearing masks and hand washing to prevent.acquiring Covid.    Please restrict travel out of the area month prior to surgery. If you do travel or have family coming in please check the [place] covid travel advisery page about requirments. It is changing frequently on their changing of restriction  Flu vaccine please get at least 2 weeks prior to surgery or follow up to get 2 weeks post op.   Covid vaccine please plan for 5 weeks before surgical date-  and or wait until after OR date.   ?  COVID 19 UPDATES   You will receive a mask and have your temp checked-on entering hospital please wear it when staff enter your room or you are out in hallway.   ?  Visitation-HH   The policy, effective [place], August 14, 2019, goes back to allowing one Support Person per surgical patient. This person will now be allowed to stay in the Hospital while their loved one is in the OR. The Visitation Policy also states each patient can only have two designated people who are able to visit them. Only one of those designees is allowed per day and for no more than four hours. One very important change is all patients and visitors must wear medical grade masks when in the hospital. The masks can be provided to them as they enter the hospital if needed. No clothes masks only will be  allowed inside the hospital       If further questions please  call me with any questions pre op    Threasa Alpha, RN CNL  Nurse Navigator, [facility]

## 2019-09-15 ENCOUNTER — Telehealth: Payer: Self-pay | Admitting: Primary Care

## 2019-09-15 ENCOUNTER — Encounter: Payer: Self-pay | Admitting: Orthopedic Surgery

## 2019-09-15 NOTE — Telephone Encounter (Signed)
Patient has a left knee replacement planned for 5/18 2021 she will need a preoperative visit 3 to 4 weeks before hand with me.

## 2019-09-18 NOTE — Telephone Encounter (Signed)
Left detailed message for [name] to call me to schedule an appointment.

## 2019-09-20 ENCOUNTER — Encounter: Payer: Self-pay | Admitting: Gastroenterology

## 2019-09-21 ENCOUNTER — Encounter: Payer: Self-pay | Admitting: Primary Care

## 2019-09-21 ENCOUNTER — Other Ambulatory Visit: Payer: Self-pay | Admitting: Primary Care

## 2019-09-25 NOTE — Telephone Encounter (Signed)
Spoke with Malachy Mood:  Scheduled pre-op visit with Dr. Faustino Congress on April 28 at 7:20 am.

## 2019-10-24 NOTE — Progress Notes (Signed)
Pre-Visit Planning    Health Maintenance Due   Topic Date Due    IMM-PNEUMOCOCCAL VACCINE AGE 62-64 HIGH RISK  Never done    COVID-19 Vaccine (1) Never done       Notes:  - Last Mammogram: 08/08/2019     Completed on 10/24/19 by Corlis Hove

## 2019-10-25 ENCOUNTER — Ambulatory Visit: Payer: BLUE CROSS/BLUE SHIELD | Admitting: Primary Care

## 2019-10-25 ENCOUNTER — Encounter: Payer: Self-pay | Admitting: Primary Care

## 2019-10-25 VITALS — BP 126/80 | HR 90 | Wt 228.0 lb

## 2019-10-25 DIAGNOSIS — R0609 Other forms of dyspnea: Secondary | ICD-10-CM

## 2019-10-25 DIAGNOSIS — K219 Gastro-esophageal reflux disease without esophagitis: Secondary | ICD-10-CM

## 2019-10-25 DIAGNOSIS — R0600 Dyspnea, unspecified: Secondary | ICD-10-CM

## 2019-10-25 DIAGNOSIS — J310 Chronic rhinitis: Secondary | ICD-10-CM

## 2019-10-25 DIAGNOSIS — Z01818 Encounter for other preprocedural examination: Secondary | ICD-10-CM

## 2019-10-25 NOTE — Progress Notes (Signed)
Chief complaint:Pleasant female seen here for pre -op for left knee replacement 11/14/2019  as well as review of multiple issues we reviewed together family history past medical history medications allergies and immunizations.  Recent lab work reviewed together.         HPI:    Pt seen for left knee replacement 5/18--does have preop visit comiong up with ortho. Cycling 35 min  daily at a good pace with no chest pain shortness of breath palpitations however when she goes up and down stairs she feels short of breath as if no stamina.  I suspect this is related to the knee pain which she does not have one cycling.    gerd--famotidine and carafate at night    Rhinitis--flonase controls it    Migraines--rare and tryptan works    Mood--appropriately down with recent passing of mother and work stress.  Denies need for increased medication for mood at this time.      Current Outpatient Medications   Medication Sig Dispense Refill    famotidine (PEPCID) 40 MG tablet Take 40 mg by mouth 2 times daily      fluticasone (FLONASE) 50 MCG/ACT nasal spray Spray 1 spray into nostril 2 times daily 48 g 3    buPROPion (WELLBUTRIN XL) 150 MG 24 hr tablet TAKE 1 TABLET BY MOUTH EVERY MORNING 90 tablet 0    acetaminophen (TYLENOL) 325 MG tablet Take 650 mg by mouth every 6 hours as needed for Pain      sucralfate (CARAFATE) 1 GM tablet Take 1 g by mouth nightly       rizatriptan (MAXALT) 5 MG tablet Take 1 tablet (5 mg total) by mouth as needed for Migraine   May repeat in 2 hours if needed.  Max 6 tabs/day. 10 tablet 3    Calcium 500 MG tablet Take 1 tablet by mouth daily         Cholecalciferol (VITAMIN D3) 1000 UNIT tablet Take 1,000 Units by mouth every morning          No current facility-administered medications for this visit.        Patient Active Problem List    Diagnosis Date Noted    Microhematuria 07/14/2019    Current use of proton pump inhibitor 06/04/2019    Hiatal hernia 12/12/2018    Chronic GERD 12/12/2018     Osteoarthritis of right knee 04/28/2017    Primary osteoarthritis of left knee 08/26/2015    Tear of medial cartilage or meniscus of knee, current 12/03/2010    Depression 11/07/2010    Vitamin D deficiency 07/23/2010    Sleep apnea 02/22/2007     [facility] Annotation: Mar 15 2007  9:05PM - Dillan Lunden, [place]: dr Niue --not on cpap        Overweight 04/05/2003             Migraine headache 04/05/2003             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                 Past Medical History:   Diagnosis Date    Abdominal pain 07/19/2012    Anxiety 3/12    Benign neoplasm of colon     Calcaneal bursitis 01/14/2012    right    Chronic cholecystitis 0000000    Complication of anesthesia     PONV    Depression     Diverticulosis 11/27/2013    mild    Internal  hemorrhoids     Migraine     q2-3 months    Mitral valve prolapse     OA (osteoarthritis)     left knee    Osteoarthritis, knee 12/03/2010    Rosacea 04/05/2003          Ruptured cerebral aneurysm     age 43    Sleep apnea     does not use CPAP    Syncope     d/t migraines    Urinary tract infection     frequent  3 this year    Vertigo         Past Surgical History:   Procedure Laterality Date    CESAREAN SECTION, CLASSIC  '86 '88    spinal    CHOLECYSTECTOMY, LAPAROSCOPIC  11/16/2012    COLONOSCOPY  2012    COLONOSCOPY  11/24/2013    10/2018    COLONOSCOPY  05/19/2019    repeat 04/2024    KNEE ARTHROSCOPY Left 06/19/11    strabismus correction Right 1972, 1993    x2--child + 32 yo    [place]    4 wisdom and B/L bicuspids        Family History   Problem Relation Age of Onset    Breast cancer Mother     Depression Mother     Stroke Father     Heart failure Father     Heart Disease Father     Breast cancer Sister     Heart Disease Brother     Asthma Sister     Ovarian cancer Maternal Grandmother     Colon cancer Maternal Grandfather     Depression Sister     Depression Brother     Depression Daughter      Stroke Paternal Grandmother     Stroke Paternal Grandfather     Substance abuse Sister         Drug - past    Substance abuse Brother         Alcohol - current       Review of systems:    Generally feeling:  Okay but stamina is low  Feeling tired: mild  and sleep not great last few weeks  Fever:  No  Chills:  No  Recent weight change:  No  Headache:  rare  Vision problems:  No  Hearing loss:  No  Nasal symptoms:  controlled  Sore throat:  No  Chest pain:  No  Palpitations:  No  Leg pain with exercise (claudication):  No  Shortness of breath:  Not with cycling but with stairs  Cough:  No   wheezing:  No  Change in appetite:  No  Heartburn:  controlled  Nausea:  No  Vomiting:  No  Abdominal pain:  No  Change in the stool:  No  Diarrhea:  No  Constipation:  No  Blood in  urine:  No  Increased urinary frequency:  No  Pain during urination:  No  Excessive thirst or fluid intake:  No  Excessive sweating:  No  Easy bleeding:  No  Easy bruising tendency:  No  Back pain:  None  Generalized muscle aches:  No  Localized joint pain:  Left knee>>>>right  Dizziness:  Rare vertigo--positional  Lightheadedness:  No  Fainting:  No  Memory loss:  No  Skin rash:  No  Skin lesion:  No    Social history extended    Caffeine [id number]  Tobacco [id number]  Alcohol use:1 a month  Drug [id number]  married  Full time      Vitals:    10/25/19 0720   BP: 126/80   Pulse: 90   Weight: 103.4 kg (228 lb)     Body mass index is 38.23 kg/m.    Physical exam:    General appearance:    Well-developed, well groomed.  Appears stated age.  No acute distress.  Color good.  Mental status:  Appears alert and oriented.  Affect appropriate.  Skin:  Skin color and turgor normal.  Head:  Normocephalic  Ears:  External ears without scars, masses, or lesions.  External auditory canal intact, clear, and without lesions.  TMs intact with normal light reflex and landmarks.  Acuity to conversational tones good.  Eyes:  extraocular movements intact.  Lids without  defect, conjunctiva and sclera appear normal.  Nose:  Nasal mucosa and turbinates pink, septum midline, no lesions.  Mouth:  Teeth in good repair.  Gums pink without lesions.  Normal appearing mucosa, palate, and tongue.  Oropharynx:  Moist without exudate, erythema, or swelling.  Neck:  Symmetric, trachea midline.  Thyroid nontender without enlargement or masses.  Carotids with no bruits bilaterally.  No cervical lymphadenopathy.  Chest:  Respirations unlabored.  Chest was symmetric with no masses.  Breath sounds clear bilaterally without wheezes, rubs, rales, or rhonchi.  CV:  Normal S1 and S2 without murmur, rub, gallop, or click.  No edema, clubbing, or cyanosis.  Dorsalis pedis pulses full and symmetrical.  Abdomen:  Abdomen soft with normal bowel sounds.    No palpable masses or tenderness.  Liver without tenderness or enlargement.  No aortic widening.             Assessment and plan:         Pleasant female seen here for upcoming knee replacement in the 18th she cycles on a regular basis without issues however she has noted some dyspnea and decreased stamina with stairs I suspect this is likely related to her knee pain however with that in mind we will check a dobutamine echo before this elective procedure.  As long as stress test okay she will be considered medically maximized for upcoming procedure.  Patient reminded to avoid NSAIDs prior to procedure at least 1 week before hand.        EKG pending    GERD continue famotidine and Carafate at night    Rhinitis continue fluticasone    Mood appropriately down some with loss of mom declines the need for anything to help sleep mood more than baseline bupropion at this time    Migraines uses Maxalt when needed    Vitamin D deficiency on supplementation    Total time of care date of service 40 minutes

## 2019-10-27 ENCOUNTER — Telehealth: Payer: Self-pay

## 2019-10-27 ENCOUNTER — Other Ambulatory Visit: Payer: BLUE CROSS/BLUE SHIELD

## 2019-10-27 NOTE — Telephone Encounter (Signed)
LM on pt's AM     Pt schedule for DSE on 5/3 at ER.  Looking to see if we can move her to [place] same time.  Spot has an exception on it.Will need ER spot

## 2019-10-27 NOTE — Telephone Encounter (Signed)
Message left on on dedicated voice mail.  Dobutamine stress test instructions:  Will require IV for medication infusion to stimulate heart.   Will be laying on left side on a table while sonographer takes ultrasound pictures of the heart. Test can take up to 2 hours to complete, wear comfortable clothing.  Do not have anything to eat for 4 hours prior, please drink fluids.      Phones go off at 4:30 today and on again on Monday at 8 am.  Call back if questions.     MyChart message sent.

## 2019-10-27 NOTE — Telephone Encounter (Signed)
Message left on dedicated voice mail to call back for dobutamine instructions.

## 2019-10-30 ENCOUNTER — Ambulatory Visit
Admission: RE | Admit: 2019-10-30 | Discharge: 2019-10-30 | Disposition: A | Discharge status: Home / Self Care | Payer: BLUE CROSS/BLUE SHIELD | Source: Ambulatory Visit | Attending: Cardiology | Admitting: Cardiology

## 2019-10-30 ENCOUNTER — Encounter: Payer: Self-pay | Admitting: Primary Care

## 2019-10-30 DIAGNOSIS — R0600 Dyspnea, unspecified: Secondary | ICD-10-CM

## 2019-10-30 DIAGNOSIS — R0609 Other forms of dyspnea: Secondary | ICD-10-CM

## 2019-10-30 LAB — DOBUTAMINE STRESS ECHO COMPLETE
AV Area (LVOT SR Mtd): 1.89 cm2
AV CWD Velocity (Peak): 143 cm/s
AV Gradient (peak): 5.2 mmHg
Aortic Arch Diameter: 2.6 cm
Aortic Diameter (mid tubular): 2.5 cm
BMI: 38.4 kg/m2
BP Diastolic: 90 mmHg
BP Systolic: 120 mmHg
BSA: 2.19 m2
E/A ratio: 1.06
Echo RV Stroke Work Index Estimate: 1250.2 mmHg•mL/m2
Heart Rate: 60 {beats}/min
Height: 65 in
LA Systolic Diameter: 3.78 cm
LA Systolic Vol BSA Index: 21.9 mL/m2
LA Systolic Vol Height Index: 29.1 mL/m
LA Systolic Volume: 48 mL
LV ASE Mass BSA Index: 83.5 gm/m2
LV ASE Mass Height 2.7 Index: 47.2 gm/m2.7
LV ASE Mass Height Index: 110.7 gm/m
LV ASE Mass: 182.8 gm
LV CO BSA Index: 2.03 L/min/m2
LV Cardiac Output: 4.44 L/min
LV Diastolic Volume Index: 50.2 mL/m2
LV Posterior Wall Thickness: 0.9 cm
LV SV - LVOT SV Diff: 8.37 mL
LV SV BSA Index: 33.8 mL/m2
LV SV Height Index: 44.8 mL/m
LV Septal Thickness: 1.03 cm
LV Stroke Volume: 74 mL
LV Systolic Volume Index: 16.4 mL/m2
LV wall/cavity ratio: 0.37
LVED Diameter BSA Index: 2.36 cm/m2
LVED Diameter Height Index: 3.13 cm/m
LVED Diameter: 5.16 cm
LVED Volume BSA Index: 50 ml/m2
LVED Volume BSA Index: 50.2 mL/m2
LVED Volume Height Index: 66.6 mL/m
LVED Volume: 110 mL
LVEF (Volume): 67 %
LVES Diameter BSA Index: 1.6 cm/m2
LVES Diameter Height Index: 2.12 cm/m
LVES Diameter: 3.5 cm
LVES Volume BSA Index: 16 ml/m2
LVES Volume BSA Index: 16.4 mL/m2
LVES Volume Height Index: 21.8 mL/m
LVES Volume: 36 mL
LVOT + AV Gradient (peak): 8.2 mmHg
LVOT Area (calculated): 3.14 cm2
LVOT Cardiac Index: 1.8 L/min/m2
LVOT Cardiac Output: 3.94 L/min
LVOT Diameter: 2 cm
LVOT PWD VTI: 20.9 cm
LVOT PWD Velocity (mean): 66.2 cm/s
LVOT PWD Velocity (peak): 85.9 cm/s
LVOT SV BSA Index: 29.97 mL/m2
LVOT SV Height Index: 39.7 mL/m
LVOT Stroke Rate (mean): 207.9 mL/s
LVOT Stroke Rate (peak): 269.7 mL/s
LVOT Stroke Volume: 65.63 cc
LVOT/AV Velocity Ratio: 0.6
MPHR: 159 {beats}/min
MR Regurgitant Fraction (LV SV Mtd): 0.11
MR Regurgitant Volume (LV SV Mtd): 8.4 mL
MV Peak A Velocity: 95.3 cm/s
MV Peak E Velocity: 101 cm/s
Mitral Annular E/Ea Vel Ratio: 8.08
Mitral Annular Ea Velocity: 12.5 cm/s
Peak DBP: 80 mmHg
Peak Gradient - TR: 37 mmHg
Peak HR: 147 {beats}/min
Peak SBP: 140 mmHg
Percent MPHR: 92.5 %
Pulmonary Vascular Resistance Estimate: 8.3 mmHg
RA Pressure Estimate: 3 mmHg
RPP: 20580 BPM x mmHG
RR Interval: 1000 ms
RV Peak Systolic Pressure: 40 mmHg
Stress Peak Stage: 3.67
Stress duration (min): 11 min
Stress duration (sec): 0 s
Weight (lbs): 230 [lb_av]
Weight: 3680 oz

## 2019-10-30 MED ORDER — DOBUTAMINE 4 MG/ML IN D5W IV SOLN (ADULT STANDARD) *I*
5.0000 ug/kg/min | INTRAVENOUS | Status: DC
Start: 2019-10-30 — End: 2019-10-31
  Administered 2019-10-30: 5 ug/kg/min via INTRAVENOUS

## 2019-10-30 MED ORDER — ATROPINE SULFATE 0.1 MG/ML IJ SOLN *I*
0.5000 mg | INTRAMUSCULAR | Status: AC | PRN
Start: 2019-10-30 — End: 2019-10-30
  Administered 2019-10-30: 0.5 mg via INTRAVENOUS

## 2019-10-30 NOTE — Telephone Encounter (Signed)
Pt never called back and came for her test

## 2019-10-30 NOTE — Telephone Encounter (Signed)
S/w pt, reviewed the dobutamine test. Pt does not take a beta blocker, understands standard instructions for Dobutamine testing.    Pt was uncertain if she wanted to do the dobutamine or if she would be able to push through knee pain for a treadmill. She will think about and reach out to her PCP if she wishes to change the order.

## 2019-10-30 NOTE — Progress Notes (Signed)
Patient gave verbal consent for exercise portion of test after reviewing 'Consent for Cardiac Stress Test' form SH [id number].  For the extent of the COVID-19 pandemic, [facility] is avoiding physical signatures on consent forms in an effort to reduce transmission of COVID-19. I have reviewed the procedure, risks, and alternatives documented above with the patient. All questions have been answered. The patient expresses understanding and has verbally consented to the procedure.

## 2019-10-30 NOTE — Discharge Instructions (Signed)
The test done today will be read by our cardiologist and the results will be forwarded to the doctor that ordered the test.  Follow up with your doctor for the results after 2-3 business days.

## 2019-11-01 ENCOUNTER — Encounter: Payer: Self-pay | Admitting: Orthopedic Surgery

## 2019-11-01 NOTE — Progress Notes (Signed)
CMS PRE-OP CHECKLIST    SITE:  Knee  SIDE: left                                 DIAGNOSIS: Advanced degenerative changes seen on radiographs  HISTORY:  (See Dictated Office Note)  Description of pain: The patient rates their pain a 10  Onset: Gradual  Duration: Years                  Aggravating factors: Work, Activity and Recreation                             Relieving factors: Rest, Ice and Medication  Limitation of ADLs:  Walking: No  Driving: No Stair Climbing: No Getting Dressed: No  Safety Issues (falls): No  Contraindication to non-surgical treatments: No  Failed non-surgical treatments:  Medications/NSAIDs:   Tylenol and NSAIDs      Weight loss: Current  Physical therapy:yes    Intra-articular injection of knee: Yes    Braces/orthotics or assistive devices:  No    PHYSICAL EXAM:  Exam:     The left knee has varus alignment. There is moderate swelling. A Bakers cyst is present. The knee istender to palpation over the medial side.  There isinstability with varus and valgus stresses. Anterior Drawers negative. Posterior Drawersnegative. Lachmans test is negative. McMurrays test is positive.  Range of motion is 3-105. There is crepitus with range of motion. There is pain with range of motion. With ambulation gait was antalgic.      INVESTIGATIONS:   Radiographs demonstrate advanced DJD    REASONS FOR DEVIATING FROM A STEPPED-CARE APPROACH:  None

## 2019-11-02 ENCOUNTER — Ambulatory Visit
Admission: RE | Admit: 2019-11-02 | Discharge: 2019-11-02 | Disposition: A | Discharge status: Home / Self Care | Payer: BLUE CROSS/BLUE SHIELD | Source: Ambulatory Visit | Attending: Orthopedic Surgery | Admitting: Orthopedic Surgery

## 2019-11-02 ENCOUNTER — Other Ambulatory Visit: Payer: Self-pay | Admitting: Orthopedic Surgery

## 2019-11-02 ENCOUNTER — Ambulatory Visit
Admission: RE | Admit: 2019-11-02 | Discharge: 2019-11-02 | Disposition: A | Discharge status: Home / Self Care | Payer: BLUE CROSS/BLUE SHIELD | Source: Ambulatory Visit

## 2019-11-02 DIAGNOSIS — Z01818 Encounter for other preprocedural examination: Secondary | ICD-10-CM

## 2019-11-02 DIAGNOSIS — M170 Bilateral primary osteoarthritis of knee: Secondary | ICD-10-CM | POA: Insufficient documentation

## 2019-11-02 DIAGNOSIS — M21752 Unequal limb length (acquired), left femur: Secondary | ICD-10-CM | POA: Insufficient documentation

## 2019-11-02 DIAGNOSIS — M1712 Unilateral primary osteoarthritis, left knee: Secondary | ICD-10-CM | POA: Diagnosis present

## 2019-11-02 HISTORY — DX: Other specified postprocedural states: Z98.890

## 2019-11-02 HISTORY — DX: Nausea with vomiting, unspecified: R11.2

## 2019-11-02 LAB — HEMOGLOBIN A1C: Hemoglobin A1C: 5.9 % — ABNORMAL HIGH

## 2019-11-02 LAB — CBC
Hematocrit: 41 % (ref 34–45)
Hemoglobin: 12.6 g/dL (ref 11.2–15.7)
MCH: 27 pg (ref 26–32)
MCHC: 31 g/dL — ABNORMAL LOW (ref 32–36)
MCV: 86 fL (ref 79–95)
Platelets: 182 10*3/uL (ref 160–370)
RBC: 4.7 MIL/uL (ref 3.9–5.2)
RDW: 15.9 % — ABNORMAL HIGH (ref 11.7–14.4)
WBC: 5 10*3/uL (ref 4.0–10.0)

## 2019-11-02 LAB — COMPREHENSIVE METABOLIC PANEL
ALT: 15 U/L (ref 0–35)
AST: 18 U/L (ref 0–35)
Albumin: 4.1 g/dL (ref 3.5–5.2)
Alk Phos: 68 U/L (ref 35–105)
Anion Gap: 11 (ref 7–16)
Bilirubin,Total: 0.3 mg/dL (ref 0.0–1.2)
CO2: 28 mmol/L (ref 20–28)
Calcium: 9 mg/dL (ref 8.6–10.2)
Chloride: 103 mmol/L (ref 96–108)
Creatinine: 0.79 mg/dL (ref 0.51–0.95)
GFR,Black: 93 *
GFR,Caucasian: 81 *
Glucose: 99 mg/dL (ref 60–99)
Lab: 10 mg/dL (ref 6–20)
Potassium: 4.5 mmol/L (ref 3.3–5.1)
Sodium: 142 mmol/L (ref 133–145)
Total Protein: 6.7 g/dL (ref 6.3–7.7)

## 2019-11-02 LAB — ORTHOPEDICS MRSA NASAL COMPLETE PCR: Orthopedics MRSA nasal complete PCR: 0

## 2019-11-02 LAB — ORTHOPEDICS SA NASAL COMPLETE PCR: Orthopedics SA nasal complete PCR: 0

## 2019-11-02 LAB — APTT: aPTT: 30 s (ref 25.8–37.9)

## 2019-11-02 LAB — PROTIME-INR
INR: 1 (ref 0.9–1.1)
Protime: 11.1 s (ref 10.0–12.9)

## 2019-11-02 LAB — TYPE AND SCREEN
ABO RH Blood Type: O POS
Antibody Screen: NEGATIVE

## 2019-11-02 NOTE — Anesthesia Preprocedure Evaluation (Addendum)
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  Electrophysiology/AICD/Pacer:  25 October 2019 -- NS R@ 63 bpm, no pathologic QRS/ST/T changes, no significant change from baseline.    .  By Connye Burkitt, PA at 7:44 AM on 11/02/2019    Anesthesia Evaluation Information Source: patient, records, family     ANESTHESIA HISTORY    + history of anesthetic complications          PONV  Pertinent(-):  No Family hx of anesthetic complications    GENERAL    + Obesity (BMI approx 38)  Pertinent (-):  No substance abuse or Family Hx of Anesthetic Complications    HEENT    + Visual Impairment          corrective lens for ADL    + Sinus Issues            PND  Pertinent (-):  No neck pain PULMONARY    + Currently Active Environmental Allergies    + Snoring    + Sleep apnea (mild, no CPAP)  Pertinent(-):  No smoking, asthma or recent URI    CARDIOVASCULAR  Good(4+METs) Exercise Tolerance    + Cardiac Testing          stress echo, more details above, normal function  Pertinent(-):  No hypertension, past MI, angina, valvular heart disease, dysrhythmias, CHF or hx of DVT    Comment: Ride stationary bike 45 minutes daily, climbs stairs    GI/HEPATIC/RENAL    NPO Status  NPO    > 2hrs ago (clears) and > 8hrs ago (solids)    + GERD          well controlled   + Esophageal Issues         hiatal hernia    + Alcohol use          social  Pertinent(-):  No liver  issues, renal issues or urinary issues  NEURO/PSYCH    + Headaches          migraines and infrequent    + Dizziness/Motion  Sickness    + Psychiatric Issues          depression, anxiety    + Cerebrovascular event (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13, left side paralysis resolved for many yrs, 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)    + Positioning issues  Pertinent(-):  No syncope, seizures, neuromuscular disease or gait/mobility issues    ENDO/OTHER     Denies endo issues  Pertinent(-):  No diabetes mellitus, thyroid disease    HEMATOLOGIC    + Arthritis          knees  Pertinent(-):  No bruising/bleeding easily         Physical Exam    Airway            Mouth opening: normal            Mallampati: II            TM distance (fb): >3 FB  Neck ROM: full            Airway Impression: easy  Dental   Normal Exam   Cardiovascular  Normal Exam           Rhythm: regular           Rate: normal  No murmur      Neurologic    Normal Exam    General Survey    Normal Exam   Pulmonary   Normal Exam    breath sounds clear to auscultation    No wheezes    Mental Status   Normal Exam    oriented to person, place and time       ________________________________________________________________________  PLAN  ASA Score  3  Anesthetic Plan general    Protocols/Care Pathways (Total Joint Arthroplasty) Induction (routine IV) General Anesthesia/Sedation Maintenance Plan (inhaled agents, propofol infusion and IV bolus); Airway (LMA); Line ( use current access); Monitoring (standard ASA); Positioning (supine and arms out); PONV Plan (dexamethasone, haloperidol, no N2O, propofol infusion and no muscle relaxants); Pain (nerve block, intraop local and per surgical team- Bupiv SAB per protocol); PostOp (PACU)    Informed Consent     Risks:          Risks discussed were commensurate with the plan listed above with the following specific points: N/V, aspiration and sore throat, Damage to: eyes, nerves, blood vessels and teeth, allergic Rx, unexpected serious injury, awareness and death, HA, BA, hypotension, high or inadequate block, nerve  damage, bleeding, infection.    Anesthetic Consent:         Anesthetic plan (and risks as noted above) were discussed with patient and spouse    Blood products Consent:        Use of blood products discussed with: patient and spouse and they consented    Responsible Anesthesia Attestation:  I attest that the patient or proxy understands and accepts the risks and benefits of the anesthesia plan. I also attest that I have personally performed a pre-anesthetic examination and evaluation, and prescribed the anesthetic plan for this particular location within 48 hours prior to the anesthetic as documented. Simonne Come, MD  11/14/19, 11:36 AM

## 2019-11-02 NOTE — Discharge Instructions (Signed)
Pre-Operative Instructions - Total Joint Replacement                FOLLOW YOUR SURGEON'S INSTRUCTIONS IF DIFFERENT THAN BELOW.                   PRIOR TO SURGERY    Five days before surgery, please STOP taking if surgeon does not specify:    · Anti-inflammatory medications (Ibuprofen, Motrin, Advil, Mobic, Meloxicam, Aleve, Naproxen, Voltaren, etc.)  · Vitamins and herbal supplements, including herbal teas     · YOU MAY TAKE ACETAMINOPHEN (TYLENOL) as needed    · Directions regarding your prescribed blood thinners, including aspirin, must be approved by your cardiologist or prescribing doctor.      THREE NIGHTS BEFORE SURGERY FOR INFECTION CONTROL    • We ask you to shower for 3 evenings before your surgery using the 4% Chlorhexidine soap the nurse has given you.     • Each night take a shower using your normal soap and shampoo.    • Afterward, while still in the shower, pour approximately1/3 of the 4% Chlorhexidine soap on a washcloth and wash your body from the neck down. DO NOT wash your head, face, eyes and ears with the Chlorhexidine soap. This soap does not lather. Let the soap sit on your skin for 2 minutes.  Rinse thoroughly.     • Dry off with a clean, fresh towel each evening.    • Do not apply lotion after your showers. Do not shave below your waist for one week before your surgery.    • If you experience itching or redness on application, wash it off and do not use it again. Continue with an over-the-counter antibacterial soap (such as Dial).    • After showering, dress in freshly laundered clothes each evening and sleep on clean bedsheets.    ARRIVAL/SURGICAL TIME    · Staff from the [facility] will call you between 130PM and 4PM on the day before surgery to inform you of your arrival and surgery times. The call will be on Friday if your surgery is on Monday.    DAY BEFORE  SURGERY    · Keep yourself well hydrated to aid in the placement of your IV and for your general well-being.  · Do not eat anything after midnight the night before your surgery (including candy or gum).                                                                                         DAY OF SURGERY    · DO NOT CONSUME FOOD OF ANY KIND.    · Only Gatorade, clear apple juice or water from midnight until 2 hours before your scheduled surgery.    · DO NOT WEAR: HEAVY MAKEUP, DARK NAIL POLISH, HAIR PINS, BODY LOTION OR SCENTS.      · Please understand that rings and body piercings need to be removed and left at home. If they are not removed, your surgery is at risk of being delayed or cancelled.     · When you come to [facility], please try   not bring any valuable items with the exception of your phone to keep you in contact with your family members.     ·  Valuables such as jewelry, cash and credit cards are best left at home during your stay.  Please send them home with family members.  If you are alone a staff member will assist you in storing your belongings.    · We ask that your cell phone be with you and turned on, so that the Preop and OR RNs may phone you directly with instructions on the morning of surgery if necessary.    · [facility] does not assume responsibility for items brought with you on the day of surgery.    · If wearing eyeglasses, please bring a case.  DO NOT WEAR CONTACT LENSES.    · You may brush your teeth, shower (with Chlorhexidine soap) and use deodorant.    · Patient visitation is restricted at this time due to COVID-19 concerns.     SURGICAL SITE INFECTION MEASURES PERFORMED ON THE DAY OF SURGERY:       Neck to toes Chlorhexidine bath-6 prepackaged wipes applied to the skin surface   Nasal Antiseptic-4 swabs used to clean nostrils    Chlorhexidine mouth rinse for 30 seconds      MEDICATIONS: DAY OF SURGERY    · Take your medications as directed according to your printed, verbal or  MyChart instructions.    · Anxiety and pain medications may be taken as prescribed at any time prior to arrival.   · Medications from the [facility] will be administered to you under the direction of your surgeon. Please leave your prescriptions at home with the exception of your inhalers.      AT THE HOSPITAL ON THE DAY OF SURGERY    · Park in the Main Ramp garage.  Enter the building through the Main Lobby.     · Please stop at the Information Desk for directions to the Surgery Center on [address].    [facility]    As a convenience, prior to your discharge we will fill any discharge medications you require.      The pharmacy is open from 9am to 5:30pm on weekdays and 10am-2pm on Saturdays.     If you will be alone on the day of surgery and want to leave with your prescribed medication, you may:  · Bring a check made out to [facility]  · Use a credit card to pay for your prescription  · Have your family call the pharmacy with a credit card number  · Only bring cash to the hospital as a last resort. Prescriptions cannot be filled without payment. Thank you for your consideration.    QUESTIONS?    · Question about these instructions? Call [phone], select option 2, and leave a message at any time.   A nurse will return your call during our regular business hours.  · Any questions regarding specifics about your surgery or recovery? Please call your surgeon’s office.

## 2019-11-07 NOTE — Comprehensive Assessment (Signed)
11/07/19 [place]   Spokesperson Name Yeilani Wolle   Relationship to Patient  Spouse   Phone Number(s) [phone] [facility]) [phone] (Cell)   Alternate Spokesperson? No   Emergency Contact other than spokesperson? No   Is spokesperson the patient's caregiver after discharge? Yes   Discharge transportation   Ride to/from facility [facility] to Transport   Relationship to Patient  Spouse      11/07/19 [place]   Marital status Married   Ethnicity/Race Caucasian   Primary Language English   Primary Care Taker of? No one   Living Situation   Lives With Spouse   Can they assist patient after discharge? Yes   Home Geography   Type of Home 2 Story home   Bedroom Second floor   Bathroom Second floor - full;First floor - full   Utilitites Working Yes   Psychosocial   Person assessed Patient   Coping Status Well   Current Goal of Care Curative   Baseline ADL functioning   Transfers Independent   Ambulation Independent   Assistive Device none   Bathing/Grooming Independent   Meal Prep Independent   Able to feed self? Yes   Household maintenance/chores Independent   Able to drive? Yes   Income Information   Vocational Full time employment   Health and safety inspector of Network   ([place]   Prescription Coverage and has   Pharmacy Used [facility]   Current home equipment available Walker;Rollator;Raised toilet seat;Shower chair   SW Home oxygen   Do you have home oxygen? No   Time Spent   Time Spent with Patient (min) 15       SW spoke with pt via telephone to complete Adult SW screen. Pt reports that she lives in a 2 story home with her husband, Herbie Baltimore, with 2 steps to enter home. Pt will have a first floor set up for after surgery, with full bath and sleeping arrangement on the 1st floor. Pt has a walk-in shower with bench, raised toilet seat, and rollator available at home. Pt is also borrowing a 2WW for after surgery.      Pt is independent with ADLs at baseline without the use of equipment or oxygen. Pt is agreeable to a [facility] referral. Pt's husband, Herbie [place], will be dropping off/picking up after surgery. All additional questions answered at this time. SW will continue to follow for discharge planning needs.     [place], MSW  11/07/19  3:06 PM  Pager # [phone]

## 2019-11-09 ENCOUNTER — Ambulatory Visit: Payer: BLUE CROSS/BLUE SHIELD | Attending: Orthopedic Surgery

## 2019-11-09 DIAGNOSIS — Z20822 Contact with and (suspected) exposure to covid-19: Secondary | ICD-10-CM | POA: Insufficient documentation

## 2019-11-09 DIAGNOSIS — Z01812 Encounter for preprocedural laboratory examination: Secondary | ICD-10-CM | POA: Insufficient documentation

## 2019-11-09 DIAGNOSIS — Z20828 Contact with and (suspected) exposure to other viral communicable diseases: Secondary | ICD-10-CM | POA: Insufficient documentation

## 2019-11-09 DIAGNOSIS — Z01818 Encounter for other preprocedural examination: Secondary | ICD-10-CM | POA: Insufficient documentation

## 2019-11-09 LAB — COVID-19 NAAT (PCR): COVID-19 NAAT (PCR): NEGATIVE

## 2019-11-09 LAB — COVID-19 PCR

## 2019-11-13 NOTE — Invasive Procedure Plan of Care (Signed)
[facility] [facility]  OR SURGICAL PROCEDURE                            Patient Name: [name]  [facility] [id number] MR                                                              DOB: 1955/12/18         Please read this form or have someone read it to you.   It's important to understand all parts of this form. If something isn't clear, ask [place] to explain.   When you sign it, that means you understand the form and give [place] permission to do this surgery or procedure.     I agree for Reggie Pile, MD , and [facility], [facility] 9 Assistants or Nurse practitioners along with any assistants* they may choose, to treat the following condition(s): Arthritisoif the Knee joint   By doing this surgery or procedure on me: Replacement of Knee Joint using computer guided instrumentation   This is also known as: Total Knee Replacement with Computer guidance   Laterality: Left     *if you'd like a list of the assistants, please ask. We can give that to you.    1. The care provider has explained my condition to me. They have told me how the procedure can help me. They have told me about other ways of treating my condition. I understand the care provider cannot guarantee the result of the procedure. If I don't have this procedure, my other choices are: Regular knee replacement, no surgery, pain medication, nerve ablation, ambulatory aids, scooter      2. The care provider has told me the risks (problems that can happen) of the procedure. I understand there may be unwanted results. The risks that are related to this procedure include: Infection, Nerve injury, Fracture, Dislocation, Implant failure,  Joint stiffness Instability. Lateral numbness, clunking and crackling, Bleeding, Swelling, Bruising, Blisters and other skin reactions, wound breakdown, Blood clot, pulmonary embolism, heart and lung problems, stroke, urinary retention,  urethral trauma,  major organ failure, anesthetic complications, loss of limb and life, other unknown issues    3. I understand that during the procedure, my care provider may find a condition that we didn't know about before the treatment started. Therefore, I agree that my care provider can perform any other treatment which they think is necessary and available.    4. I understand the care provider may remove tissue, body parts, or materials during this procedure. These materials may be used to help with my diagnosis and treatment. They might also be used for teaching purposes or for research studies that I have separately agreed to participate in. Otherwise they will be disposed of as required by law.    5. My care provider might want a representative from a [place] to be there during my procedure. I understand that person works for:  [employer]        The ways they might help my care provider during my procedure include:        Helping the operating room staff prepare the special device  my care provider wants to use. and Providing information and support to operating room staff regarding the device.    6. Here are my decisions about receiving blood, blood products, or tissues. I understand my decisions cover the time before, during and after my procedure, my treatment, and my time in the hospital. After my procedure, if my condition changes a lot, my care provider will talk with me again about receiving blood or blood products. At that time, my care provider might need me to review and sign another consent form, about getting or refusing blood.    I understand that the blood is from the community blood supply. Volunteers donated the blood, the volunteers were screened for health problems. The blood was examined with very sensitive and accurate tests to look for hepatitis, HIV/AIDS, and other diseases. Before I receive blood, it is tested again to make sure it is the correct type.    My chances  of getting a sickness from blood products are small. But no transfusion is 100% safe. I understand that my care provider feels the good I will receive from the blood is greater than the chances of something going wrong. My care provider has answered my questions about blood products.      My decision  about blood or  blood products   Yes, I agree to recieve blood or blood products if my care provider thinks they're needed.        My decision   about tissue  Implants     Yes, I agree to recieve tissue implants if my care provider thinks they're needed.          I understand this  form.    My care provider  or his/her  assistants have  explained:   What I am having done and why I need it.  What other choices I can make instead of having this done.  The benefits and possible risks (problems) to me of having this done.  The benefits and possible risks (problems) to me of receiving transplants, blood, or blood products.  There is no guarantee of the results.  The care provider may not stay with me the entire time that I am in the operating or procedure room.  My provider has explained how this may affect my procedure. My provider has answered my  questions about this.         I give my  permission for  this surgery or  procedure.            _______________________________________________                                     My signature  (or parent or other person authorized to sign for you, if you are unable to sign for  yourself or if you are under 34 years old)        ______           Date        _____        Time   Electronic Signatures will display at the bottom of the consent form.    Care provider's statement: I have discussed the planned procedure, including the possibility for transfusion of blood  products or receipt of tissue as necessary; expected benefits; the possible complications and risks; and possible alternatives  and their benefits and risks with the patients or  the patient's surrogate. In my opinion,  the patient or the patient's surrogate  understands the proposed procedure, its risks, benefits and alternatives.              Electronically signed by: Reggie Pile, MD                                                11/13/2019         Date        1:51 PM        Time

## 2019-11-13 NOTE — Invasive Procedure Plan of Care (Signed)
[facility]  OR SURGICAL PROCEDURE                            Patient Name: [name]  [facility] [id number] MR                                                              DOB: [birth year]         Please read this form or have someone read it to you.   It's important to understand all parts of this form. If something isn't clear, ask [place] to explain.   When you sign it, that means you understand the form and give [place] permission to do this surgery or procedure.     I agree for Reggie Pile, MD along with any assistants* they may choose, to treat the following condition(s): Osteoarthritis, Knee   By doing this surgery or procedure on me: Placement of Artificial Knee Joint   This is also known as: Total knee replacement   Laterality: Left     *if you'd like a list of the assistants, please ask. We can give that to you.    1. The care provider has explained my condition to me. They have told me how the procedure can help me. They have told me about other ways of treating my condition. I understand the care provider cannot guarantee the result of the procedure. If I don't have this procedure, my other choices are: No surgery, pain medication, nerve ablation, ambulatory aids, scooter    2. The care provider has told me the risks (problems that can happen) of the procedure. I understand there may be unwanted results. The risks that are related to this procedure include: Infection, Nerve injury, Fracture, Dislocation, Implant failure,  Joint stiffness Instability. Lateral numbness, clunking and crackling, Bleeding, Swelling, Bruising, Blisters and other skin reactions, wound breakdown, Blood clot, pulmonary embolism, heart and lung problems, stroke, urinary retention, urethral trauma,  major organ failure, anesthetic complications, loss of limb and life, other unknown issues. We have discussed concerns regarding total joint surgery  during the Covid outbreak. They are aware of the presence of Covid in the community and willing to undergo procedure given the unreasonable pain and suffering that would be caused by deferring surgery until after the pandemic. Reaffirmed that every possible safety measure is being performed to prevent exposure during and after the hospitalization, but they are potentially more exposed to Covid than if they remained sheltering at home, and willingly accept this small risk. Reassured that the regulations are constantly being reviewed by hospital officials who have allowed the surgery to be performed during this pandemic.      3. I understand that during the procedure, my care provider may find a condition that we didn't know about before the treatment started. Therefore, I agree that my care provider can perform any other treatment which they think is necessary and available.    4. I understand the care provider may remove tissue, body parts, or materials during this procedure. These materials may be used to help with my diagnosis and treatment. They might also be used for teaching purposes or for research  studies that I have separately agreed to participate in. Otherwise they will be disposed of as required by law.    5. My care provider might want a representative from a [place] to be there during my procedure. I understand that person works for:  [employer]        The ways they might help my care provider during my procedure include:        Helping the operating room staff prepare the special device my care provider wants to use. and Providing information and support to operating room staff regarding the device.    6. Here are my decisions about receiving blood, blood products, or tissues. I understand my decisions cover the time before, during and after my procedure, my treatment, and my time in the hospital. After my procedure, if my condition changes a lot, my care provider will talk with me again  about receiving blood or blood products. At that time, my care provider might need me to review and sign another consent form, about getting or refusing blood.    I understand that the blood is from the community blood supply. Volunteers donated the blood, the volunteers were screened for health problems. The blood was examined with very sensitive and accurate tests to look for hepatitis, HIV/AIDS, and other diseases. Before I receive blood, it is tested again to make sure it is the correct type.    My chances of getting a sickness from blood products are small. But no transfusion is 100% safe. I understand that my care provider feels the good I will receive from the blood is greater than the chances of something going wrong. My care provider has answered my questions about blood products.      My decision  about blood or  blood products   Yes        My decision   about tissue  Implants     Yes          I understand this  form.    My care provider  or his/her  assistants have  explained:   What I am having done and why I need it.  What other choices I can make instead of having this done.  The benefits and possible risks (problems) to me of having this done.  The benefits and possible risks (problems) to me of receiving transplants, blood, or blood products.  There is no guarantee of the results.  The care provider may not stay with me the entire time that I am in the operating or procedure room.  My provider has explained how this may affect my procedure. My provider has answered my  questions about this.         I give my  permission for  this surgery or  procedure.            _______________________________________________                                     My signature  (or parent or other person authorized to sign for you, if you are unable to sign for  yourself or if you are under 76 years old)        ______           Date        _____        Time   Clinical biochemist  will display at the bottom of the  consent form.    Care provider's statement: I have discussed the planned procedure, including the possibility for transfusion of blood  products or receipt of tissue as necessary; expected benefits; the possible complications and risks; and possible alternatives  and their benefits and risks with the patients or the patient's surrogate. In my opinion, the patient or the patient's surrogate  understands the proposed procedure, its risks, benefits and alternatives.              Electronically signed by: Reggie Pile, MD                                                11/13/2019         Date        1:51 PM        Time

## 2019-11-14 ENCOUNTER — Inpatient Hospital Stay: Payer: BLUE CROSS/BLUE SHIELD

## 2019-11-14 ENCOUNTER — Inpatient Hospital Stay: Payer: BLUE CROSS/BLUE SHIELD | Admitting: Anesthesiology

## 2019-11-14 ENCOUNTER — Encounter
Admission: RE | Disposition: A | Discharge status: Home Health | Payer: Self-pay | Source: Ambulatory Visit | Attending: Orthopedic Surgery

## 2019-11-14 ENCOUNTER — Inpatient Hospital Stay
Admission: RE | Admit: 2019-11-14 | Discharge: 2019-11-15 | Disposition: A | Discharge status: Home Health | Payer: BLUE CROSS/BLUE SHIELD | Source: Ambulatory Visit | Attending: Orthopedic Surgery | Admitting: Orthopedic Surgery

## 2019-11-14 ENCOUNTER — Inpatient Hospital Stay: Payer: BLUE CROSS/BLUE SHIELD | Admitting: Internal Medicine

## 2019-11-14 ENCOUNTER — Other Ambulatory Visit: Payer: Self-pay

## 2019-11-14 DIAGNOSIS — G8918 Other acute postprocedural pain: Secondary | ICD-10-CM

## 2019-11-14 DIAGNOSIS — M1712 Unilateral primary osteoarthritis, left knee: Secondary | ICD-10-CM | POA: Diagnosis present

## 2019-11-14 DIAGNOSIS — M65862 Other synovitis and tenosynovitis, left lower leg: Secondary | ICD-10-CM | POA: Insufficient documentation

## 2019-11-14 DIAGNOSIS — M25762 Osteophyte, left knee: Secondary | ICD-10-CM | POA: Insufficient documentation

## 2019-11-14 LAB — BASIC METABOLIC PANEL
Anion Gap: 11 (ref 7–16)
CO2: 24 mmol/L (ref 20–28)
Calcium: 9 mg/dL (ref 8.6–10.2)
Chloride: 106 mmol/L (ref 96–108)
Creatinine: 0.59 mg/dL (ref 0.51–0.95)
GFR,Black: 114 *
GFR,Caucasian: 99 *
Glucose: 165 mg/dL — ABNORMAL HIGH (ref 60–99)
Lab: 8 mg/dL (ref 6–20)
Potassium: 4.3 mmol/L (ref 3.3–5.1)
Sodium: 141 mmol/L (ref 133–145)

## 2019-11-14 LAB — POCT GLUCOSE: Glucose POCT: 91 mg/dL (ref 60–99)

## 2019-11-14 LAB — HCT AND HGB
Hematocrit: 42 % (ref 34–45)
Hemoglobin: 12.9 g/dL (ref 11.2–15.7)

## 2019-11-14 LAB — MCHC: MCHC: 31 g/dL — ABNORMAL LOW (ref 32–36)

## 2019-11-14 SURGERY — ARTHROPLASTY, KNEE, TOTAL, USING COMPUTER-ASSISTED NAVIGATION
Anesthesia: General | Site: Leg | Laterality: Left | Wound class: Clean

## 2019-11-14 MED ORDER — LIDOCAINE HCL (PF) 1 % IJ SOLN *I*
0.1000 mL | INTRAMUSCULAR | Status: DC | PRN
Start: 2019-11-14 — End: 2019-11-14
  Administered 2019-11-14: 0.1 mL via SUBCUTANEOUS
  Filled 2019-11-14: qty 2

## 2019-11-14 MED ORDER — ENOXAPARIN SODIUM 40 MG/0.4ML IJ SOSY *I*
40.0000 mg | PREFILLED_SYRINGE | INTRAMUSCULAR | Status: DC
Start: 2019-11-15 — End: 2019-11-15
  Administered 2019-11-15: 40 mg via SUBCUTANEOUS
  Filled 2019-11-14: qty 0.4

## 2019-11-14 MED ORDER — CALCIUM CARBONATE ANTACID 500 MG PO CHEW *I*
1000.0000 mg | CHEWABLE_TABLET | Freq: Three times a day (TID) | ORAL | Status: DC | PRN
Start: 2019-11-14 — End: 2019-11-15

## 2019-11-14 MED ORDER — LACTATED RINGERS IV SOLN *I*
125.0000 mL/h | INTRAVENOUS | Status: DC
Start: 2019-11-14 — End: 2019-11-14
  Administered 2019-11-14: 125 mL/h via INTRAVENOUS

## 2019-11-14 MED ORDER — SODIUM CHLORIDE 0.9 % FLUSH FOR PUMPS *I*
0.0000 mL/h | INTRAVENOUS | Status: DC | PRN
Start: 2019-11-14 — End: 2019-11-14

## 2019-11-14 MED ORDER — BISACODYL 10 MG RE SUPP *I*
10.0000 mg | Freq: Every day | RECTAL | Status: DC | PRN
Start: 2019-11-14 — End: 2019-11-15

## 2019-11-14 MED ORDER — SENNOSIDES 8.6 MG PO TABS *I*
2.0000 | ORAL_TABLET | Freq: Every evening | ORAL | 0 refills | Status: DC
Start: 2019-11-14 — End: 2020-01-04
  Filled 2019-11-14: qty 100, 50d supply, fill #0

## 2019-11-14 MED ORDER — BUPIVACAINE IN DEXTROSE 0.75-8.25 % IT SOLN *I*
INTRATHECAL | Status: DC | PRN
Start: 2019-11-14 — End: 2019-11-14
  Administered 2019-11-14: 1.4 mL via INTRATHECAL

## 2019-11-14 MED ORDER — KETOROLAC TROMETHAMINE 30 MG/ML IJ SOLN *I*
INTRAMUSCULAR | Status: DC | PRN
Start: 2019-11-14 — End: 2019-11-14
  Administered 2019-11-14: 61 mL via INTRA_ARTICULAR

## 2019-11-14 MED ORDER — DOCUSATE SODIUM 100 MG PO CAPS *I*
200.0000 mg | ORAL_CAPSULE | Freq: Every day | ORAL | 0 refills | Status: DC
Start: 2019-11-14 — End: 2020-01-04
  Filled 2019-11-14: qty 100, 50d supply, fill #0

## 2019-11-14 MED ORDER — SUCRALFATE 1 GM PO TABS *I*
1.0000 g | ORAL_TABLET | Freq: Every evening | ORAL | Status: DC
Start: 2019-11-14 — End: 2019-11-15
  Administered 2019-11-14: 1 g via ORAL
  Filled 2019-11-14: qty 1

## 2019-11-14 MED ORDER — HALOPERIDOL LACTATE 5 MG/ML IJ SOLN *I*
INTRAMUSCULAR | Status: DC | PRN
Start: 2019-11-14 — End: 2019-11-14
  Administered 2019-11-14: 1 mg via INTRAVENOUS

## 2019-11-14 MED ORDER — ASPIRIN 81 MG PO TBEC *I*
81.0000 mg | DELAYED_RELEASE_TABLET | Freq: Two times a day (BID) | ORAL | Status: DC
Start: 2019-11-14 — End: 2019-11-14

## 2019-11-14 MED ORDER — FAMOTIDINE 20 MG PO TABS *I*
40.0000 mg | ORAL_TABLET | Freq: Two times a day (BID) | ORAL | Status: DC
Start: 2019-11-14 — End: 2019-11-15
  Administered 2019-11-14 – 2019-11-15 (×2): 40 mg via ORAL
  Filled 2019-11-14 (×2): qty 2

## 2019-11-14 MED ORDER — GABAPENTIN 300 MG PO CAPSULE *I*
300.0000 mg | ORAL_CAPSULE | Freq: Once | ORAL | Status: AC
Start: 2019-11-14 — End: 2019-11-14
  Administered 2019-11-14: 300 mg via ORAL
  Filled 2019-11-14: qty 1

## 2019-11-14 MED ORDER — GABAPENTIN 300 MG PO CAPSULE *I*
600.0000 mg | ORAL_CAPSULE | Freq: Every evening | ORAL | Status: DC
Start: 2019-11-14 — End: 2019-11-14

## 2019-11-14 MED ORDER — PROPOFOL 10 MG/ML IV EMUL (INTERMITTENT DOSING) WRAPPED *I*
INTRAVENOUS | Status: DC | PRN
Start: 2019-11-14 — End: 2019-11-14
  Administered 2019-11-14: 150 mg via INTRAVENOUS
  Administered 2019-11-14: 50 mg via INTRAVENOUS

## 2019-11-14 MED ORDER — HYDROMORPHONE HCL PF 1 MG/ML IJ SOLN *WRAPPED*
0.5000 mg | INTRAMUSCULAR | Status: DC | PRN
Start: 2019-11-14 — End: 2019-11-14

## 2019-11-14 MED ORDER — FENTANYL CITRATE 50 MCG/ML IJ SOLN *WRAPPED*
INTRAMUSCULAR | Status: AC
Start: 2019-11-14 — End: 2019-11-14
  Filled 2019-11-14: qty 2

## 2019-11-14 MED ORDER — MIDAZOLAM HCL 1 MG/ML IJ SOLN *I* WRAPPED
INTRAMUSCULAR | Status: AC
Start: 2019-11-14 — End: 2019-11-14
  Filled 2019-11-14: qty 2

## 2019-11-14 MED ORDER — CELECOXIB 200 MG PO CAPS *I*
200.0000 mg | ORAL_CAPSULE | Freq: Once | ORAL | Status: AC
Start: 2019-11-14 — End: 2019-11-14
  Administered 2019-11-14: 200 mg via ORAL
  Filled 2019-11-14: qty 1

## 2019-11-14 MED ORDER — MELOXICAM 7.5 MG PO TABS *I*
15.0000 mg | ORAL_TABLET | Freq: Every day | ORAL | Status: DC
Start: 2019-11-15 — End: 2019-11-15
  Administered 2019-11-15: 15 mg via ORAL
  Filled 2019-11-14: qty 2

## 2019-11-14 MED ORDER — POLYETHYLENE GLYCOL 3350 PO PACK 17 GM *I*
17.0000 g | PACK | Freq: Every day | ORAL | Status: DC
Start: 2019-11-14 — End: 2019-11-15
  Administered 2019-11-14 – 2019-11-15 (×2): 17 g via ORAL
  Filled 2019-11-14 (×2): qty 17

## 2019-11-14 MED ORDER — BUPIVACAINE-EPINEPHRINE 0.25 % IJ SOLUTION *WRAPPED*
INTRAMUSCULAR | Status: DC | PRN
Start: 2019-11-14 — End: 2019-11-14
  Administered 2019-11-14: 15 mL via PERINEURAL

## 2019-11-14 MED ORDER — OXYCODONE HCL 5 MG PO TABS *I*
5.0000 mg | ORAL_TABLET | ORAL | Status: DC | PRN
Start: 2019-11-14 — End: 2019-11-15
  Administered 2019-11-14 – 2019-11-15 (×2): 5 mg via ORAL
  Filled 2019-11-14 (×3): qty 1

## 2019-11-14 MED ORDER — TRANEXAMIC ACID 1000 MG / 0.7% NACL 100 ML IVPB *I*
INTRAVENOUS | Status: AC
Start: 2019-11-14 — End: 2019-11-14
  Filled 2019-11-14: qty 100

## 2019-11-14 MED ORDER — SODIUM CHLORIDE 0.9 % 25 ML IV SOLN *I*
6.2500 mg | Freq: Once | INTRAVENOUS | Status: DC | PRN
Start: 2019-11-14 — End: 2019-11-14

## 2019-11-14 MED ORDER — SODIUM CHLORIDE 0.9 % 25 ML IV SOLN *I*
6.2500 mg | Freq: Four times a day (QID) | INTRAVENOUS | Status: DC | PRN
Start: 2019-11-14 — End: 2019-11-15

## 2019-11-14 MED ORDER — TRANEXAMIC ACID 1000 MG / 0.7% NACL 100 ML IVPB *I*
1000.0000 mg | Freq: Once | INTRAVENOUS | Status: AC
Start: 2019-11-14 — End: 2019-11-14
  Administered 2019-11-14: 1000 mg via INTRAVENOUS

## 2019-11-14 MED ORDER — DEXAMETHASONE SODIUM PHOSPHATE 4 MG/ML INJ SOLN *WRAPPED*
INTRAMUSCULAR | Status: DC | PRN
Start: 2019-11-14 — End: 2019-11-14
  Administered 2019-11-14: 8 mg via INTRAVENOUS

## 2019-11-14 MED ORDER — MORPHINE SULFATE ER 15 MG PO TBCR *I*
15.0000 mg | ORAL_TABLET | Freq: Two times a day (BID) | ORAL | Status: DC
Start: 2019-11-14 — End: 2019-11-15
  Administered 2019-11-14 – 2019-11-15 (×2): 15 mg via ORAL
  Filled 2019-11-14 (×2): qty 1

## 2019-11-14 MED ORDER — ONDANSETRON HCL 2 MG/ML IV SOLN *I*
4.0000 mg | Freq: Four times a day (QID) | INTRAMUSCULAR | Status: DC | PRN
Start: 2019-11-14 — End: 2019-11-15

## 2019-11-14 MED ORDER — ONDANSETRON HCL 2 MG/ML IV SOLN *I*
4.0000 mg | Freq: Once | INTRAMUSCULAR | Status: DC | PRN
Start: 2019-11-14 — End: 2019-11-14

## 2019-11-14 MED ORDER — PROPOFOL 10 MG/ML IV EMUL (INTERMITTENT DOSING) WRAPPED *I*
INTRAVENOUS | Status: AC
Start: 2019-11-14 — End: 2019-11-14
  Filled 2019-11-14: qty 20

## 2019-11-14 MED ORDER — CAMPHOR-MENTHOL 0.5-0.5 % EX LOTN *I*
TOPICAL_LOTION | CUTANEOUS | Status: DC | PRN
Start: 2019-11-14 — End: 2019-11-15

## 2019-11-14 MED ORDER — SODIUM CHLORIDE 0.9 % IR SOLN *I*
Status: DC | PRN
Start: 2019-11-14 — End: 2019-11-14
  Administered 2019-11-14: 250 mL

## 2019-11-14 MED ORDER — BUPIVACAINE-EPINEPHRINE 0.25 % IJ SOLUTION *WRAPPED*
INTRAMUSCULAR | Status: AC
Start: 2019-11-14 — End: 2019-11-14
  Filled 2019-11-14: qty 60

## 2019-11-14 MED ORDER — ACETAMINOPHEN 500 MG PO TABS *I*
1000.0000 mg | ORAL_TABLET | Freq: Three times a day (TID) | ORAL | Status: DC
Start: 2019-11-14 — End: 2019-11-15
  Administered 2019-11-14 – 2019-11-15 (×3): 1000 mg via ORAL
  Filled 2019-11-14 (×4): qty 2

## 2019-11-14 MED ORDER — BUPROPION HCL 150 MG PO TB24 *I*
150.0000 mg | ORAL_TABLET | Freq: Every morning | ORAL | Status: DC
Start: 2019-11-15 — End: 2019-11-15
  Administered 2019-11-15: 150 mg via ORAL
  Filled 2019-11-14: qty 1

## 2019-11-14 MED ORDER — CHLORHEXIDINE GLUCONATE 0.12 % MT SOLN *STORES SUPPLIED*
15.0000 mL | Freq: Once | OROMUCOSAL | Status: AC
Start: 2019-11-14 — End: 2019-11-14
  Administered 2019-11-14: 15 mL via OROMUCOSAL

## 2019-11-14 MED ORDER — CEFAZOLIN 2000 MG IN STERILE WATER 20ML SYRINGE *I*
2000.0000 mg | PREFILLED_SYRINGE | Freq: Three times a day (TID) | INTRAVENOUS | Status: AC
Start: 2019-11-14 — End: 2019-11-15
  Administered 2019-11-14 – 2019-11-15 (×2): 2000 mg via INTRAVENOUS
  Filled 2019-11-14 (×2): qty 20

## 2019-11-14 MED ORDER — SODIUM CHLORIDE 0.9 % IV SOLN WRAPPED *I*
125.0000 mL/h | Status: DC
Start: 2019-11-14 — End: 2019-11-15
  Administered 2019-11-14: 125 mL/h via INTRAVENOUS
  Administered 2019-11-14 (×3): 125 mL/h
  Administered 2019-11-14: 125 mL/h via INTRAVENOUS
  Administered 2019-11-14: 125 mL/h
  Administered 2019-11-14: 20 mL/h
  Administered 2019-11-14 – 2019-11-15 (×2): 125 mL/h

## 2019-11-14 MED ORDER — CEFAZOLIN 2000 MG IN STERILE WATER 20ML SYRINGE *I*
2000.0000 mg | PREFILLED_SYRINGE | Freq: Once | INTRAVENOUS | Status: AC
Start: 2019-11-14 — End: 2019-11-14
  Administered 2019-11-14: 2000 mg via INTRAVENOUS
  Filled 2019-11-14: qty 20

## 2019-11-14 MED ORDER — BUPIVACAINE HCL 0.5 % IJ SOLUTION *WRAPPED*
INTRAMUSCULAR | Status: AC
Start: 2019-11-14 — End: 2019-11-14
  Filled 2019-11-14: qty 30

## 2019-11-14 MED ORDER — TRAZODONE HCL 50 MG PO TABS *I*
50.0000 mg | ORAL_TABLET | Freq: Every evening | ORAL | Status: DC | PRN
Start: 2019-11-14 — End: 2019-11-15

## 2019-11-14 MED ORDER — ACETAMINOPHEN 500 MG PO TABS *I*
1000.0000 mg | ORAL_TABLET | Freq: Once | ORAL | Status: AC
Start: 2019-11-14 — End: 2019-11-14
  Administered 2019-11-14: 1000 mg via ORAL
  Filled 2019-11-14: qty 2

## 2019-11-14 MED ORDER — BUPIVACAINE-EPINEPHRINE 0.5 % IJ SOLUTION *WRAPPED*
INTRAMUSCULAR | Status: DC | PRN
Start: 2019-11-14 — End: 2019-11-14
  Administered 2019-11-14: 20 mL via PERINEURAL

## 2019-11-14 MED ORDER — SODIUM CHLORIDE 0.9 % FLUSH FOR PUMPS *I*
0.0000 mL/h | INTRAVENOUS | Status: DC | PRN
Start: 2019-11-14 — End: 2019-11-15

## 2019-11-14 MED ORDER — FENTANYL CITRATE 50 MCG/ML IJ SOLN *WRAPPED*
INTRAMUSCULAR | Status: DC | PRN
Start: 2019-11-14 — End: 2019-11-14
  Administered 2019-11-14 (×2): 50 ug via INTRAVENOUS

## 2019-11-14 MED ORDER — LIDOCAINE HCL 1 % IJ SOLN *I*
INTRAMUSCULAR | Status: DC | PRN
Start: 2019-11-14 — End: 2019-11-14
  Administered 2019-11-14: 3 mL via SUBCUTANEOUS

## 2019-11-14 MED ORDER — DEXTROSE 5 % FLUSH FOR PUMPS *I*
0.0000 mL/h | INTRAVENOUS | Status: DC | PRN
Start: 2019-11-14 — End: 2019-11-14

## 2019-11-14 MED ORDER — PROPOFOL INFUSION 10 MG/ML *I*
INTRAVENOUS | Status: DC | PRN
Start: 2019-11-14 — End: 2019-11-14
  Administered 2019-11-14: 100 ug/kg/min via INTRAVENOUS
  Administered 2019-11-14: 50 ug/kg/min via INTRAVENOUS

## 2019-11-14 MED ORDER — LIDOCAINE HCL 2 % (PF) IJ SOLN *I*
INTRAMUSCULAR | Status: AC
Start: 2019-11-14 — End: 2019-11-14
  Filled 2019-11-14: qty 5

## 2019-11-14 MED ORDER — MIDAZOLAM HCL 1 MG/ML IJ SOLN *I* WRAPPED
INTRAMUSCULAR | Status: DC | PRN
Start: 2019-11-14 — End: 2019-11-14
  Administered 2019-11-14: 2 mg via INTRAVENOUS

## 2019-11-14 MED ORDER — LACTATED RINGERS IV SOLN *I*
20.0000 mL/h | INTRAVENOUS | Status: DC
Start: 2019-11-14 — End: 2019-11-14
  Administered 2019-11-14: 20 mL/h via INTRAVENOUS

## 2019-11-14 MED ORDER — POVIDONE-IODINE 5 % EX SOLN *I*
Freq: Once | CUTANEOUS | Status: AC
Start: 2019-11-14 — End: 2019-11-14

## 2019-11-14 MED ORDER — DEXTROSE 5 % FLUSH FOR PUMPS *I*
0.0000 mL/h | INTRAVENOUS | Status: DC | PRN
Start: 2019-11-14 — End: 2019-11-15

## 2019-11-14 MED ORDER — LIDOCAINE HCL 2 % IJ SOLN *I*
INTRAMUSCULAR | Status: DC | PRN
Start: 2019-11-14 — End: 2019-11-14
  Administered 2019-11-14: 60 mg via INTRAVENOUS

## 2019-11-14 MED ORDER — SENNOSIDES 8.6 MG PO TABS *I*
2.0000 | ORAL_TABLET | Freq: Every day | ORAL | Status: DC
Start: 2019-11-14 — End: 2019-11-15
  Administered 2019-11-14 – 2019-11-15 (×2): 2 via ORAL
  Filled 2019-11-14 (×2): qty 2

## 2019-11-14 MED ORDER — OXYCODONE HCL 5 MG PO TABS *I*
10.0000 mg | ORAL_TABLET | ORAL | Status: DC | PRN
Start: 2019-11-14 — End: 2019-11-15
  Administered 2019-11-15: 10 mg via ORAL
  Filled 2019-11-14: qty 2

## 2019-11-14 MED ORDER — ASPIRIN 81 MG PO TBEC *I*
81.0000 mg | DELAYED_RELEASE_TABLET | Freq: Two times a day (BID) | ORAL | 0 refills | Status: AC
Start: 2019-11-14 — End: 2019-12-13
  Filled 2019-11-14: qty 56, 28d supply, fill #0

## 2019-11-14 MED ORDER — DOCUSATE SODIUM 100 MG PO CAPS *I*
200.0000 mg | ORAL_CAPSULE | Freq: Every evening | ORAL | Status: DC
Start: 2019-11-14 — End: 2019-11-15
  Administered 2019-11-14: 200 mg via ORAL
  Filled 2019-11-14: qty 2

## 2019-11-14 MED ORDER — SODIUM CHLORIDE 0.9 % IV SOLN WRAPPED *I*
20.0000 mL/h | Status: DC
Start: 2019-11-14 — End: 2019-11-14

## 2019-11-14 SURGICAL SUPPLY — 49 items
APPLICATOR CHLORAPREP STER  ORANGE 26ML (Supply) ×2 IMPLANT
BASEPLATE TIB SIZE 5 CEMENTED FIXED BEARING S  TECHNOLOGY ATTUNE KNEE REV SYS (Implant) ×2 IMPLANT
BLADE CLIPPER SURG (Supply) ×1
BLADE RECIPRO DBL SID (Supply) IMPLANT
BLADE SAG DUAL CUT 18 X 90 X 1.27MM (Supply) ×2 IMPLANT
BLADE SUR CLIPPER W37.2MM CUT H0.23MM GEN PURP EXISTING CLP HNDL DISP (Supply) ×1 IMPLANT
BLADE SUR W37.2MM CUT H0.23MM GEN PURP EXISTING CLP HNDL DISP (Supply) ×1 IMPLANT
CEMENT PALACOS HI VISCOSITY SGL (Implant) ×4 IMPLANT
COVER FOOT IMPAD REGULAR NL (Supply) ×2 IMPLANT
CUFF TOURNIQUET QUICK ADULT 34X4IN DISP (Supply) ×2 IMPLANT
DRAPE SHEET 70X100 (Drape) ×1
DRAPE SPLIT 77X120 (Drape) ×1
DRAPE STERI TOWEL 1010 NL (Drape) ×2 IMPLANT
DRAPE SUR W70XL100IN STD SMS POLYPR FULL SHT W/O FLD PCH DISP (Drape) ×1 IMPLANT
DRAPE SUR W77XL120IN SMS SPL W/ ADH DISP (Drape) ×1 IMPLANT
DRAPE SURG IOBAN 2 ANTIMIC LG 23 X 17IN (Drape) IMPLANT
FILTER NEPTUNE 4PORT MANIFOLD (Supply) ×2 IMPLANT
GLOVE BIOGEL PI MICRO IND UNDER SZ 7.5 LF (Glove) ×2 IMPLANT
GLOVE BIOGEL PI MICRO IND UNDER SZ 8.0 LF (Glove) ×2 IMPLANT
GLOVE BIOGEL PI MICRO SZ 8.5 (Glove) ×2 IMPLANT
GLOVE BIOGEL PI ORTHOPRO SZ 7.5 LF (Glove) ×8 IMPLANT
GLOVE BIOGEL PI PRO-FIT SZ 6.0 LF (Glove) ×6 IMPLANT
GLOVE SURG BIOGEL PI ULTRATOUCH SZ 7.5 (Glove) ×6 IMPLANT
GLOVE SURG BIOGEL PI ULTRATOUCH SZ 8.0 (Glove) ×6 IMPLANT
GLOVE SURG DERMAPRENE ULTRA SZ8.5 PF LF (Glove) ×2 IMPLANT
HOOD SURG ISOLATION FLYTE SURGICOOL PEEL-AWAY (Supply) ×2 IMPLANT
IMPLANT FEM ATTUNE CR LT SZ 5 CEM (Implant) ×2 IMPLANT
IMPLANT PATELLA ATTUNE MEDIAL DOME 32MM (Implant) ×2 IMPLANT
INSERT ATTUNE CR FB SZ 5 5MM (Implant) ×2 IMPLANT
MIXER CEMENT W/FEM NOZZLE (Other) ×2 IMPLANT
PACK CUSTOM TOTAL KNEE (Pack) ×2 IMPLANT
PACK FIXATION INCLUDES 4 UNIVERSAL KNEE 2 THRD HEADED PIN ATTUNE (Supply) ×2 IMPLANT
PACK OR ORTHO BOLSTER (Pack) ×2 IMPLANT
PAD KNEE POSITIONER BOOT STERILE (Supply) ×2 IMPLANT
SOL H2O IRRIG STER 1000ML BTL (Solution) ×2
SOL POVIDONE STERILE 10 PCT 3/4OZ (Solution) ×2 IMPLANT
SOL SOD CHL IRRIG 1000ML BTL (Solution) ×1
SOL SOD CHL IV .9PCT 1000ML BAG (Drug) ×2 IMPLANT
SOL SODIUM CHLORIDE IRRIG 1000ML BTL (Solution) ×1 IMPLANT
SOL WATER IRRIG STERILE 1000ML BTL (Solution) ×2 IMPLANT
SPHERE REFLECTIVE DISP (Supply) ×8 IMPLANT
SPIKE MINI DISPENSING PIN (BBRAUN DP1000) (Supply) ×2 IMPLANT
SUTR STRATAFIX SPIRAL MONO 3-0 PS-1 45CM (Suture) ×5 IMPLANT
SUTR VICRYL ANTIB 1 CTX 18 POP VIOLET (Suture) ×6 IMPLANT
SYRINGE  LUER LOCK  STERILE  60ML (Other) ×2
SYRINGE 60ML  LUER LOCK  STERILE (Other) ×2 IMPLANT
SYRINGE TB SAFETYGLIDE 1CC 27G X 1/2IN (Syringe) ×2 IMPLANT
SYSTEM CLOSURE SKIN PRINEO DERMABOND 22CM (Dressing) ×2 IMPLANT
TAPE SURG TRANSPORE 1IN X 10YD LF (Supply) ×2 IMPLANT

## 2019-11-14 NOTE — Progress Notes (Signed)
Report Given To  Haze Rushing 6 RN            Descriptive Sentence / Reason for Admission     Procedure: LEFT ARTHROPLASTY, KNEE, TOTAL, USING COMPUTER-ASSISTED NAVIGATION (Left Leg) Diagnosis:        Primary osteoarthritis of left knee       (Primary osteoarthritis of left knee [M17.12])   Surgeons: Reggie Pile, MD Attending Anesthesia: Simonne Come, MD         Active Issues / Relevant Events     Patient received Bupivacaine spinal and nerve block  Has sensation at level L4  Last pain score 0/10  Oxygen is 95-98% on room air   Medications administered: none   Comorbidities include: mild OSA (does not use CPAP), GERD, migraines, vertigo, depression, anxiety, frequent UTI  Xray complete in PACU   Bladder scanned for: 220mL               To Do List    Floor orders       Anticipatory Guidance / Discharge Planning

## 2019-11-14 NOTE — Progress Notes (Signed)
Patient interview -  [facility]) completed with husband Herbie [place] via telephone    Home visit address/ contact phone confirmed: Yes/Yes her cell best # [phone]    (1) Do you have any of these symptoms of a respiratory infection: fever (>100F), cough, sore throat, or shortness of breath? No    (2) Are you experiencing diarrhea, body aches, or chills? No    (3) Do you have new loss of taste or smell? No    (4) Have you had contact with someone in the last 14 days with a confirmed diagnosis of COVID-19 (positive test result) or who is currently under investigation for COVID-19,        or ill with a respiratory illness? No    (5) Has anyone currently in your home traveled outside of [place] in the past 14 days? No    (6) Have you had a test (swab) sent for COVID-19 in the last 14 days?  yes on 5/13 pre-surgery result negative    Does Patient have technology in the home capable of zoom visits (ie: smart phone, Ipad, tablet, computer with camera) Yes  and does patient have the ability to use it?  Yes    Pets/ Smoking: No/No    Home set up/ lives with: 2 strory have set up a 1st floor bedroom, has a 1st fl full bath/husband Herbie [place]    The patient's identified caregiver is/phone #: [phone]     ADL needs:  Independent Prior to Surgery     Current DME or supplies in home: Rolling walker, rollator, Raised toilet seat, shower chair     Medication management method: Independent Set up in a weekly pill box     Pneumococcal vaccine given and date: No         Influenza vaccine given and date: 03/18/2019  Covid vaccine given and date: Pfizer-has received both vaccines in March 2021    Community or [facility] programs active with patient: None is new to [facility]     Confirmed Attending MD: Reggie Pile agrees to sign all home care orders     Confirmed Attending MD office address for home care orders as: [place]      MD office Care Manager or clinical contact: N/A pre-approved to  sign  Phone #: [phone]    Insurance Information:  Patient/family informed of home care/ DME copays   not applicable      Patient has been screened for Medicare as a Secondary Payer (MSP)   eligible for [facility]   not eligible X N/A    MVA or WC case No    Mickeal Needy RN [place]  [address]  [phone]

## 2019-11-14 NOTE — Progress Notes (Signed)
Orthopaedic Surgery Post-Operative Note for 11/14/2019    Patient:[name]  MRN: [id number]  DOA: 11/14/2019    Subjective: Patient seen post-op. Pain well controlled. Tolerating oral intake without nausea. Hasn't voided yet post-op. Patient denies fever/chills, SOB, chest pain, nausea/vomiting, or numbness/weakness.     Objective:  Temp:  [36.6 C (97.9 F)-37.3 C (99.1 F)] 36.7 C (98.1 F)  Heart Rate:  [55-95] 58  Resp:  [12-21] 16  BP: (112-140)/(52-73) 112/52    Exam:  NAD  No respiratory distress  LLE: Ace wrap c/d/i, SILT sp/dp/saph/sur/tib nerve distributions, SILT over tips of toes, +ADF/APF/EHL/Toe f/e, brisk CR, palpable DP    Assessment and Plan:  64 y.o. female with PMH GERD, Depression, PONV, L knee OA admitted on 11/14/2019 for surgery now POD#Day of Surgery s/p L TKA    Active hospital problems:   1. Post-operative Recovery  WBAT LLE  Pain control: multimodal  Dietary nutrition supplements adult: HH; Ensure High Protein  Diet clear liquids- advance diet as tolerated (and additional linked orders)  Bowel regimen: senna and colace  DVT ppx with Lovenox, then ASA 81 mg BID  Prineo dressing, ace down POD1  Post op Ancef x 24 hrs  Post op Radiographs reviewed  PT/OT/OOB  Dispo: home pending PT, pain control    Sharyn Dross, MD, PhD on 11/14/2019 at 3:26 PM

## 2019-11-14 NOTE — Plan of Care (Signed)
Problem: Impaired Bed Mobility  Goal: STG - IMPROVE BED MOBILITY  Note:   Patient will perform bed mobility without rails and the head of bed flat with Modified independence     Time frame: 1-3 days     Problem: Impaired Transfers  Goal: STG - IMPROVE TRANSFERS  Note:   Patient will complete Sit to stand transfers using a rolling walker with Modified independence     Time frame: 1-3 days     Problem: Impaired Ambulation  Goal: LTG - IMPROVE AMBULATION  Note:   Patient will ambulate 150-299 feet using a rolling walker with Modified independence    Time frame: 3-5 days       Problem: Impaired Stair Navigation  Goal: LTG - IMPROVE STAIR NAVIGATION  Note:   Patient will navigate less than 4 steps with No rail(s) and least restrictive assistive device and Modified independence     Time frame: 3-5 days

## 2019-11-14 NOTE — Op Note (Signed)
Operative Note (Surgical Case/Log ID: [id number])       Date of Surgery: 11/14/2019       Surgeons: Surgeon(s) and Role:     * Reggie Pile, MD - Primary     * Gerilyn Pilgrim Gearldine Shown, MD - Resident - Assisting     * Ouida Sills, Kerman Passey, MD, PhD - Resident - Assisting       Pre-op Diagnosis: Pre-Op Diagnosis Codes:     * Primary osteoarthritis of left knee [M17.12]       Post-op Diagnosis: Post-Op Diagnosis Codes:     * Primary osteoarthritis of left knee [M17.12]       Procedure(s) Performed: Procedure(s) (LRB):  LEFT ARTHROPLASTY, KNEE, TOTAL, USING COMPUTER-ASSISTED NAVIGATION (Left)       Anesthesia Type: General        Fluid Totals: I/O this shift:  05/18 0700 - 05/18 1459  In: 1400 (13.5 mL/kg) [I.V.:1400]  Out: - (0 mL/kg)   Net: 1400  Weight: 103.9 kg        Estimated Blood Loss: * No values recorded between 11/14/2019 10:07 AM and 11/14/2019 11:53 AM *       Specimens to Pathology:  * No specimens in log *       Temporary Implants:        Packing:                 Patient Condition: good       Indications: This patient has severe pain in the joint which is refractory to non-surgical means of treatment and is also causing debilitating loss of function and disruption of satisfactory lifestyle. The patient has requested this surgical procedure in an attempt to alleviate the aforementioned complaints. The patient has acknowledged that risks are associated with the procedure and has expressed satisfaction that they are willing to accept such risks in view of the potential benefits of the offered surgical procedure. The patient has acknowledged repeated and  ample opportunity to discuss risk and has had questions addressed by myself and my staff.         Findings (Including unexpected complications): Severe degenerative changes with loss of cartilage, eburnated bone, synovitis, osteophyte formation and excessive synovial fluid.       Description of Procedure: Title of Procedure: Total Knee  Replacement. Computer assist.    Description of Procedure:     The patient was identified,   anesthetized, and placed supine on the operating table. Laterality confirmed using the time-out ritual.  The knee and lower   extremity was prepped and draped in the usual sterile fashion.   Perioperative antibiotics were given.  A tourniquet was used.      A standard midline incision was used.  Dissection proceeded through the   subcutaneous fat until the extensor mechanism was identified.  Then an   anteromedian capsulotomy was carried out using sharp dissection and   dissecting proximally just through the medial edge of the quadriceps   tendon.  The medial capsular layer was developed and the medial periosteum   stripped from the proximal medial tibia to perform a medial release.  Now,   the lateral tissue was also resected from behind the patellar tendon to   allow lateral dislocation of the patella and then dis-articulation of the   knee as it was brought into flexion. Tibial and femoral pins placed.     Further soft tissue debridement was carried out to remove the anterior   cruciate ligament,  also  some   of the anterolateral fat pad.  Retractors were placed. Any patellofemoral ligaments laterally were also divided if necessary.     Computer arrays placed and registration performed.     Computer usd to calculate femoral size and cut. Now femoral preparation was carried out using the appropriate guides and   jigs.  Femoral sulcus cuts were performed Posterior cruciate ligament removed and femoral notch created if necessary.    Computer used to calculate tibial cut. Now the oscillating saw was used to perform the tibial cut.  The proximal   cut fragment was resected using the Bovie.     Cuts checked using computer navigation  guidance.    Finally all trial components were placed. Soft tissue   releases were performed until the knee had a good range of motion and was   stable through flexion and extension with  appropriate patellar tracking. Stability and range confirmed using the computer.  Final stability was assessed and accepted.     Femoral and tibial pins and computer arrays removed.    The tibial preparation was now carried out.  While the knee was in extension, the patella was everted and debrided using the Bovie and the rongeurs.  Now the patella preparation was carried out for the appropriate patellar component.      Pulse lavage irrigation of the bony surfaces was performed, the cement   placed on the bony surfaces and the final components malleted into position   and the trial tibial insert placed. All excessive cement was meticulously   identified and removed. The appropriate sized tibial insert was then   placed.  Final assessment was made.      The tourniquet was released to further evaluate patellar tracking and any   further necessary releases were performed. Initial hemostasis was now   performed.      Thorough irrigation was performed, pins removed and closure in layers with staples to the   skin.  A bulky sterile dressing was applied.       The patient was transferred to the gurney and then the recovery room.         POSTOPERATIVE INSTRUCTIONS:  Follow knee replacement pathway.  Ambulate   weightbearing as tolerated.  Review in office in 6 weeks.     PASSIVE RANGE:   0-120        degrees of flexion        Implants     Type Name Action Serial No.      Implant CEMENT PALACOS HI VISCOSITY SGL - [id number] Implanted      Implant INSERT ATTUNE CR FB SZ 5 5MM - [id number] Implanted      Implant BASEPLATE TIB SIZE 5 CEMENTED FIXED BEARING S  TECHNOLOGY ATTUNE KNEE REV SYS - [id number] Implanted      Implant IMPLANT FEM ATTUNE CR LT SZ 5 CEM - [id number] Implanted      Implant IMPLANT PATELLA ATTUNE MEDIAL DOME 32MM [id number] Implanted              Signed:  Reggie Pile, MD  on 11/14/2019 at 11:53 AM

## 2019-11-14 NOTE — Anesthesia Case Conclusion (Signed)
CASE CONCLUSION  [facility]  Actions:  LMA removed  Criteria Used for Airway Removal:  Following commands, acceptable O2 saturation and adequate Tv & RR  Assessment:  Routine  Transport  Directly to: PACU  Position:  Recumbent  Patient Condition on Handoff  Level of Consciousness:  Mildly sedated  Patient Condition:  Stable  Handoff Report to:  RN

## 2019-11-14 NOTE — Anesthesia Procedure Notes (Signed)
----------------------------------------------------------------------------------------------------------------------------------------    iPACK Nerve Block      Date of Procedure: 11/14/2019 10:05 AM    Laterality:  Left    Injection Technique: Single-shot    Reason for Block: Post-op pain management and At Surgeon's request        Patient Location: Pre-op  CONSENT AND TIMEOUT     Consent:  Obtained per policy    Timeout Documented by Nursing  METHOD     Patient Position:  Supine    Monitoring:  Blood pressure, Continuous pulse oximetry and EKG    Sedation Used:  Yes    Level of Sedation: Minimal              for meds injected see MAR portion of chart    Prep:  Aseptic technique per protocol and Chloraprep    Approach:  In-plane and Medial and Posterior    Technique: Ultrasound guided       Attempts:  1   NEEDLE     Type:  Short-bevel     Gauge: 22 G     Length: 8 cm  BLOCK EVENTS      No Paresthesia with needle     No Paresthesia with injection     No significant resistance to injection     No significant pain on injection     No blood aspirated     No intravascular injection     Well Tolerated  STAFF     Performed by: Attending Anesthesiologist personally performed    Attending Attestation: I personally performed the procedure     Attending: Simonne Come, MD  ----------------------------------------------------------------------------------------------------------------------------------------

## 2019-11-14 NOTE — Preop H&P (Signed)
UPDATES TO PATIENT'S CONDITION on the DAY OF SURGERY/PROCEDURE    I. Updates to Patient's Condition (to be completed by a provider privileged to complete a H&P, following reassessment of the patient by the provider):    Day of Surgery/Procedure Update:  History  History reviewed and no change    Physical  Physical exam updated and no change            II. Procedure Readiness   I have reviewed the patient's H&P and updated condition. By completing and signing this form, I attest that this patient is ready for surgery/procedure.    III. Attestation   I have reviewed the updated information regarding the patient's condition and it is appropriate to proceed with the planned surgery/procedure.    During pandemic pens are not being shared, therefore signed consent not performed today. Verbal consent  for surgery from this person has been obtained by me today.    Trig Mcbryar, MD

## 2019-11-14 NOTE — Anesthesia Procedure Notes (Signed)
---------------------------------------------------------------------------------------------------------------------------------------    NEURAXIAL BLOCK PLACEMENT  Single Shot Spinal    Date of Procedure: 11/14/2019 10:15 AM    Patient Location:  OR    Reason for Block: at surgeon's request    CONSENT AND TIMEOUT     Consent:  Obtained per policy    Timeout: patient identified (name/DOB) , nerve block procedure/site/side verified by patient or family, proper patient position verified, operative procedure/site/side verified by patient or family , needed equipment, monitors, medications and access verified as present and functioning , operative procedure/side/site verified against surgical consent, allergies reviewed with patient/record , operative procedure/side/site verified against surgical schedule, all members of the block team participated in the timeout and anticoagulation/antiplatelet status reviewed    Staff involved in Time Out (excluding authors of note):  Tori, RN  METHOD:    Patient Position: sitting    Monitoring: continuous pulse oximetry and blood pressure    Sedation Used: yes            For medications used, please see MAR    Level of Sedation: mild      Prep: aseptic technique per protocol, povidone-iodine and patient draped      Successful Approach: midline    Successful Location: L2-3    Attempts (Skin Punctures):  1  SPINAL NEEDLE:      Introducer: 20 G    Type: Pencan    Gauge: 25 G    Length: 3.5 in    CSF Appearance: clear  OBSERVATIONS:    Block Completion:  Successfully completed    Sensory Levels: bilateral    Paresthesia: none  Neuraxial Blood: blood not aspirated      Motor Block: dense    Patient Reaction to Block: tolerated procedure well and vitals remained stable  STAFF     Performed by: attending anesthesiologist personally performed    Attending Attestation: I personally performed the procedure     Attending: Simonne Come,  MD  ----------------------------------------------------------------------------------------------------------------------------------------

## 2019-11-14 NOTE — Progress Notes (Signed)
Physical Therapy    Initial Eval Completed. Patient is a 64 y.o.female who presents POD # 0 s/p L TKA with Dr. Ernst Breach    Discharge recommendation:  Intermittent supervision/assist, Home PT, Anticipate return to prior living arrangement  Equipment recommendations upon discharge: Cane  Mobility recommendations for nursing while in hospital: 1A with the rolling walker    Past Medical History:   Diagnosis Date    Abdominal pain 07/19/2012    Anxiety 3/12    Benign neoplasm of colon     Calcaneal bursitis 01/14/2012    right    Chronic cholecystitis 10/2012    Chronic GERD [id number]    Complication of anesthesia     PONV    Current use of proton pump inhibitor 06/04/2019    Depression     Diverticulosis 11/27/2013    mild    Hiatal hernia 12/12/2018    Internal hemorrhoids     Microhematuria 07/14/2019    Migraine     q2-3 months    Migraine headache 04/05/2003          Mitral valve prolapse     OA (osteoarthritis)     left knee    Osteoarthritis of right knee 04/28/2017    Osteoarthritis, knee 12/03/2010    PONV (postoperative nausea and vomiting)     Primary osteoarthritis of left knee 08/26/2015    Rosacea 04/05/2003          Ruptured cerebral aneurysm     age 24    Sleep apnea     does not use CPAP    Syncope     d/t migraines    Urinary tract infection     frequent  3 this year    Vertigo     Vitamin D deficiency 07/23/2010       Past Surgical History:   Procedure Laterality Date    CESAREAN SECTION, CLASSIC  '86 '88    spinal    CHOLECYSTECTOMY, LAPAROSCOPIC  11/16/2012    COLONOSCOPY  2012    COLONOSCOPY  11/24/2013    10/2018    COLONOSCOPY  05/19/2019    repeat 04/2024    KNEE ARTHROSCOPY Left 06/19/11    strabismus correction Right 1972, 1993    x2--child + 65 yo    [place]    4 wisdom and B/L bicuspids       *Bold Indicates co-morbidities affecting treatment and recovery    Comorbidities affecting treatment/recovery in addition to those listed above:  Total knee  replacement    Personal factors affecting treatment/recovery:   Recent hospital admission   Needs assistance for mobility    Clinical presentation:   evolving       Patient complexity:  low level as indicated by above personal factors, environmental factors and comorbidities in addition to their impairments found on physical exam.    PT Adult Assessment - 11/14/19 1547        Prior Living     Prior Living Situation  Reported by patient     Lives With  Spouse     Receives Help From  Independent     Type of Home  2 Story Home     # Steps to [place]  2     # Rails to [employer] Home  0     Location of Bedrooms  1st floor     Location of Bathrooms  1st floor     Medical Equipment in Home  Rolling walker     Additional Comments  Patient's spouse can assist prn upon discharge        Prior Function Level    Prior Function Level  Reported by patient     Transfers  Independent     Transfer Devices  None     Walking  Independent     Walking assistive devices used  None     Stair negotiation  Independent        PT Tracking    PT TRACKING  PT Assigned     Type of Session  evaluation     SW Request?  No        Treatment Day    Treatment Day  1        Precautions/Observations    Precautions used  Yes     Weight Bearing Status  Left Lower Extremity - Weight bearing as tolerated     LDA Observation  IV lines     Was patient wearing a mask?  Yes     PPE worn by Warehouse manager;Face Shield;Mask     Fall Precautions  General falls precautions;Patient educated to use call light for nursing assist prior to getting up;White board updated with appropriate mobility status        Pain Assessment    *Is the patient currently in pain?  Yes     Pain (Before,During, After) Therapy  During     0-10 Scale  1;2     Pain Location  Knee     Pain Orientation  Left;Anterior     Pain Descriptors  Aching;Discomfort;Dull     Pain Intervention(s)  Cold applied;Repositioned;Refer to nursing for pain management        Vision     Current Vision  Wears  corrective lenses        Cognition    Cognition  Tested     Arousal/Alertness  Appropriate responses to stimuli     Orientation  Alert and oriented x3     Following Commands  Follows simple commands without difficulty        UE Assessment    UE Assessment  Full range RUE AROM;Full range LUE AROM        LE Assessment    LE Assessment  Impaired AROM LLE;Full AROM RLE        Sensation    Sensation  No apparent deficit        Coordination    Coordination  --    appears intact       Bed Mobility    Bed mobility  Tested     Supine to Sit  Verbal cues;1 person assist;Stand by assistance;Contact guard;Head of bed elevated;Side rails up (#)     Sit to Supine  Not tested     Additional comments  The patient required verbal/tactile cues to facilitate sequencing of transfer to the edge of the bed. No dizziness/lightheadedness with supine to sit transfer        Transfers    Transfers  Tested     Stand Pivot Transfers  Contact guard;1 person     Sit to Stand  Verbal cues;Contact guard;1 person assist     Stand to sit  Verbal cues;1 person assist;Contact guard     Transfer Assistive Device  rolling walker     Additional comments  The patient required verbal cues for safe hand and foot placement before reaching for the rolling walker in standing; tactile cues provided  for steadiness with transfer into standing. Patient was cued to pivot to the bedside commode where she voided. Increased time spent assisting patient with hygiene        Mobility    Mobility  Tested     Weight Bearing Status  LLE     Weight Bearing Status LLE  Left Lower Extremity - Weight bearing as tolerated     Gait Pattern  2 point;3 point;Decreased cadence;Decreased stance time L;Decreased R step length;Decreased R step height     Ambulation Assist  Contact guard;1 person assist     Ambulation Distance (Feet)  65 ft     Ambulation Assistive Device  rolling walker     Additional comments  The patient required verbal cues for correct sequencing of gait with the  rolling walker. Cues for heel to toe gait pattern with mobility. No overt loss of balance with mobility assessment        Family/Caregiver Training`    Patient/Family/Caregiver training  Yes     Patient training  Discharge planning;Role of physical therapy in hospital and plan for evaluation and follow up;Post-operative precautions;PT plan of care after evaluation;Purpose and importance of icing and recommendations for ice frequency and duration;Use of assistive device;Equipment recommendations for discharge;Benefits of oob activity and mobility during hospitalization, including frequency of ambulation and change of position;Call don't fall, purpose of bed/chair alarm, and recommendation for nursing assistance during all oob mobility        Balance    Balance  Tested     Sitting - Static  Standby assist;Unsupported     Standing - Static  Contact guard;Supported     Standing - Merchandiser, retail guard;Supported        Functional Outcome Measures    Functional Outcome Measures  Yes        PT AM-PAC Mobility    Turning over in bed?  3     Sitting down on and standing up from a chair with arms?  1     Moving from lying on back to sitting on the side of the bed?  3     Moving to and from a bed to a chair?  3     Need to walk in hospital room?  3     Climbing 3 - 5 steps with a railing?  3     Total Raw Score  16        Gait Speed    Assistive Device  rolling walker     Distance Walked (meters)  3.05 m     Time it took to walk distance (seconds)  17.24 sec     Gait speed (m/sec)  0.18 m/sec        Additional Comments    Additional comments  The patient was transferred to the bedside recliner after PT evaluation. Her personal items and call bell were within reach        Assessment    Brief Assessment  Appropriate for skilled therapy     Problem List  Impaired LE ROM;Impaired LE strength;Impaired endurance;Impaired balance;Impaired transfers;Impaired ambulation;Impaired functional status;Impaired mobility;Impaired bed  mobility;Impaired functional mobility     Patient / Family Goal  return home        Plan/Recommendation    Treatment Interventions  Restorative PT;Assess functional mobility     PT Frequency  5-7x/wk;BID Visit     Mobility Recommendations  1A with the rolling walker     Referral Recommendations  OT;SW  Discharge Recommendations  Intermittent supervision/assist;Home PT;Anticipate return to prior living arrangement     PT Discharge Equipment Recommended  Cane     Assessment/Recommendations Reviewed With:  Patient;Nursing     Next PT Visit  progress per protocol        Time Calculation    PT Timed Codes  0     PT Untimed Codes  28     PT Unbilled Time  0     PT Total Treatment  28        Plan and Onset date    Plan of Care Date  11/14/19     Onset Date  11/14/19     Treatment Start Date  11/14/19         AMPAC Score Interpretation:  Adapted from Worthy Flank, et al Association of AM-PAC 6 Clicks Basic Mobility and Daily Activity Scores with discharge destination, 2021  AM-PAC Raw Score of > 16 (standardized score > 40.78, % impairment <  54%) indicates a high likelihood of discharge to home     Adapted from [place] Prediction of disposition within 48-hours of hospital admission using patient mobility scores, 2019  AM-PAC raw score  ? 15 (standardized score ? 39, % impairment  < 58%) indicates a high probability of a discharge to home  AM-PAC raw score 9-14 (standardized score ? 31, % impairment 81%-61%) and age < 73 is more likely to discharge to home  AM-PAC raw score 9-14 (standardized score ? 31, % impairment 81%-61%) and age > 61 is more likely to require post-acute care  AM-PAC raw score < 9 (standardized score < 31, % impairment > 81%) indicates pt is more likely to require post-acute care    Ermalene Searing. Anice Paganini, PT, DPT  Web page or secure chat Yvonne Kendall

## 2019-11-14 NOTE — Anesthesia Procedure Notes (Signed)
---------------------------------------------------------------------------------------------------------------------------------------    AIRWAY   GENERAL INFORMATION AND STAFF    Patient location during procedure: OR       Date of Procedure: 11/14/2019 10:37 AM  CONDITION PRIOR TO MANIPULATION     Current Airway/Neck Condition:  Normal        For more airway physical exam details, see Anesthesia PreOp Evaluation  AIRWAY METHOD     Patient Position:  Sniffing    Preoxygenated: yes      Induction: IV and Routine  Mask Difficulty Assessment:  2 - vent by mask + OPA/NPA    Number of Attempts at Approach:  2    Number of Other Approaches Attempted:  0  FINAL AIRWAY DETAILS    Final Airway Type:  LMA    Final LMA: Unique    LMA Size: 4  ----------------------------------------------------------------------------------------------------------------------------------------

## 2019-11-14 NOTE — Progress Notes (Addendum)
Physical Therapy    Treatment session completed.      Discharge recommendation:  Intermittent supervision/assist, Home PT, Anticipate return to prior living arrangement  Equipment recommendations upon discharge: Cane  Mobility recommendations for nursing while in hospital: 1A with the rolling walker    PT Adult Assessment - 11/14/19 1730        PT Tracking    PT TRACKING  PT Assigned     Type of Session  follow up/treatment     SW Request?  No        Treatment Day    Treatment Day  1        Precautions/Observations    Precautions used  Yes     Weight Bearing Status  Left Lower Extremity - Weight bearing as tolerated     LDA Observation  IV lines     Was patient wearing a mask?  Yes     PPE worn by Warehouse manager;Face Shield;Mask     Fall Precautions  General falls precautions;Patient educated to use call light for nursing assist prior to getting up;White board updated with appropriate mobility status        Pain Assessment    *Is the patient currently in pain?  Yes     Pain (Before,During, After) Therapy  During     0-10 Scale  5     Pain Location  Knee     Pain Orientation  Left;Anterior     Pain Descriptors  Aching;Discomfort;Dull     Pain Intervention(s)  Cold applied;Repositioned;Refer to nursing for pain management        Cognition    Cognition  Tested     Arousal/Alertness  Appropriate responses to stimuli     Orientation  Alert and oriented x3     Following Commands  Follows simple commands without difficulty        Bed Mobility    Bed mobility  Not tested     Additional comments  Patient greeted and exited in the bedside recliner        Transfers    Transfers  Tested     Stand Pivot Transfers  Not tested     Sit to Stand  Verbal cues;Stand by assistance;1 person assist     Stand to sit  Verbal cues;Stand by assistance;1 person assist     Transfer Assistive Device  rolling walker     Additional comments  Cues for hand and foot placement when transferring with the rolling walker        Mobility    Mobility   Tested     Weight Bearing Status  LLE     Weight Bearing Status LLE  Left Lower Extremity - Weight bearing as tolerated     Gait Pattern  2 point;3 point;Decreased cadence;Decreased stance time L;Decreased R step length;Decreased R step height     Ambulation Assist  Contact guard;1 person assist     Ambulation Distance (Feet)  95 ft     Ambulation Assistive Device  rolling walker     Additional comments  Cues for more typical 2 point step through gait pattern with the rolling walker. Patient progressing well. Educated on importance of pain management post surgery. Will continue to follow        Family/Caregiver Training`    Patient/Family/Caregiver training  Yes     Patient training  Discharge planning;Role of physical therapy in hospital and plan for evaluation and follow up;Post-operative precautions;PT plan of care after  evaluation;Purpose and importance of icing and recommendations for ice frequency and duration;Use of assistive device;Equipment recommendations for discharge;Benefits of oob activity and mobility during hospitalization, including frequency of ambulation and change of position;Call don't fall, purpose of bed/chair alarm, and recommendation for nursing assistance during all oob mobility        Therapeutic Exercises    Exercises Performed  Total knee replacement     Heel Slides, Supine  Left     Heel Slides, Supine-Assist  Active     Heel Slides, Supine-Reps  10     Quad Sets  Left     Quad Sets-Reps  10     SLR  Left     SLR-Assist  Active     SLR-Reps  10        Balance    Balance  Tested     Sitting - Static  Independent ;Unsupported     Standing - Static  Standby assist;Supported     Standing - Dynamic  Contact guard;Supported        Functional Outcome Measures    Functional Outcome Measures  Yes        PT AM-PAC Mobility    Turning over in bed?  3     Sitting down on and standing up from a chair with arms?  1     Moving from lying on back to sitting on the side of the bed?  3     Moving to and  from a bed to a chair?  3     Need to walk in hospital room?  3     Climbing 3 - 5 steps with a railing?  3     Total Raw Score  16        Gait Speed    Assistive Device  rolling walker     Distance Walked (meters)  3.05 m     Time it took to walk distance (seconds)  12.38 sec     Gait speed (m/sec)  0.25 m/sec        Assessment    Brief Assessment  Remains appropriate for skilled therapy     Patient / Family Goal  return home        Plan/Recommendation    Treatment Interventions  Restorative PT     PT Frequency  5-7x/wk;BID Visit     Mobility Recommendations  1A with the rolling walker     Referral Recommendations  OT;SW     Discharge Recommendations  Intermittent supervision/assist;Home PT;Anticipate return to prior living arrangement     PT Discharge Equipment Recommended  Cane     Assessment/Recommendations Reviewed With:  Patient;Nursing     Next PT Visit  progress per protocol        Time Calculation    PT Timed Codes  12     PT Untimed Codes  0     PT Unbilled Time  0     PT Total Treatment  12        Plan and Onset date    Plan of Care Date  11/14/19     Onset Date  11/14/19     Treatment Start Date  11/14/19         AMPAC Score Interpretation:  Adapted from Worthy Flank, et al Association of AM-PAC 6 Clicks Basic Mobility and Daily Activity Scores with discharge destination, 2021  AM-PAC Raw Score of > 16 (standardized score > 40.78, % impairment <  54%) indicates  a high likelihood of discharge to home     Adapted from [place] Prediction of disposition within 48-hours of hospital admission using patient mobility scores, 2019  AM-PAC raw score  ? 15 (standardized score ? 39, % impairment  < 58%) indicates a high probability of a discharge to home  AM-PAC raw score 9-14 (standardized score ? 31, % impairment 81%-61%) and age < 82 is more likely to discharge to home  AM-PAC raw score 9-14 (standardized score ? 31, % impairment 81%-61%) and age > 51 is more likely to require post-acute care  AM-PAC raw  score < 9 (standardized score < 31, % impairment > 81%) indicates pt is more likely to require post-acute care    Ermalene Searing. Anice Paganini, PT, DPT  Web page or secure chat Yvonne Kendall

## 2019-11-14 NOTE — Anesthesia Procedure Notes (Signed)
----------------------------------------------------------------------------------------------------------------------------------------    Adductor Canal Nerve Block      Date of Procedure: 11/14/2019 10:03 AM    Laterality:  Left    Injection Technique: Single-shot    Reason for Block: Post-op pain management and At Surgeon's request        Patient Location: Pre-op  CONSENT AND TIMEOUT     Consent:  Obtained per policy    Timeout Documented by Nursing  METHOD     Patient Position:  Supine    Monitoring:  Blood pressure, Continuous pulse oximetry and EKG    Level of Sedation: Minimal              for meds injected see MAR portion of chart    Prep:  Aseptic technique per protocol and Chloraprep    Approach:  In-plane and Anterior and Lateral    Technique: Ultrasound guided       Attempts:  1   NEEDLE     Type:  Short-bevel     Gauge: 22 G     Length: 8 cm  BLOCK EVENTS      No Paresthesia with needle     No Paresthesia with injection     No significant resistance to injection     No significant pain on injection     No blood aspirated     No intravascular injection     Well Tolerated  STAFF     Performed by: Attending Anesthesiologist personally performed    Attending Attestation: I personally performed the procedure     Attending: Simonne Come, MD  ----------------------------------------------------------------------------------------------------------------------------------------

## 2019-11-14 NOTE — Plan of Care (Signed)
Gabapentin 600mg  nightly discontinued from patients post operative pain regimen as patient states it tends to make her feel "loopy" whenever she has taken it in the past. Ortho to monitor patients pain control and make adjustments as needed.    Phenergan 6.25mg  q6h PRN added as a second line antiemetic as patient says she has had nausea with narcotic use in the past.    Ortho to continue to monitor.    Ruben Im, NP

## 2019-11-14 NOTE — Progress Notes (Signed)
Home Health Assessment      Completed by: Nicoles Sedlacek, RN  Phone: [phone]      Referred by: Misty Forshay SW      Source of Information: Medical Record      Home Health indicators present: Anticipating need for SN and PT at this time, will evaluate if pt needs any other services      Barriers to discharge to be addressed: None at this time      [facility] has reviewed the patient's e-record. [facility] will follow this patient's hospital course for home care needs; as well as to assist with a planning a safe [facility] discharge, and effective home care plan. Please call for questions or concerns regarding the discharge plan. Thank you for this referral.       [name] Whitney-Hall RN [facility]  [facility]  [address]  [place], [place] [address]  [phone]

## 2019-11-14 NOTE — INTERIM OP NOTE (Signed)
Surgery: L TKA  Planned return to OR: None  Antibiotic regimen: Ancef x 24 hrs  Pain Management: multimodal  DVT prophylaxis: Lovenox, then ASA 81 mg BID  Weight bearing status and activity restrictions: WBAT LLE  Diet: Regular  Wound Care: Prineo and ace  Xrays/Imaging: AP L knee PACU  Additional Specifics: none        Interim Op Note (Surgical Log ID: [id number])       Date of Surgery: 11/14/2019       Surgeons: Surgeon(s) and Role:     * Reggie Pile, MD - Primary     * Lanelle Bal, MD - Resident - Assisting     * Sharyn Dross, MD, PhD - Resident - Assisting   Assistants:         Pre-op Diagnosis: Pre-Op Diagnosis Codes:     * Primary osteoarthritis of left knee [M17.12]       Post-op Diagnosis: Post-Op Diagnosis Codes:     * Primary osteoarthritis of left knee [M17.12]       Procedure(s) Performed: Procedure(s) (LRB):  LEFT ARTHROPLASTY, KNEE, TOTAL, USING COMPUTER-ASSISTED NAVIGATION (Left)       Anesthesia Type: General        Fluid Totals: I/O this shift:  05/18 0700 - 05/18 1459  In: 1800 (17.3 mL/kg) [I.V.:1800]  Out: 100 (1 mL/kg) [Blood:100]  Net: 1700  Weight: 103.9 kg        Estimated Blood Loss: 100 mL       Specimens to Pathology:  * No specimens in log *       Temporary Implants:        Packing:                 Patient Condition: good       Findings (Including unexpected complications): See operative note for details.        Signed:  Sharyn Dross, MD, PhD  on 11/14/2019 at 12:33 PM

## 2019-11-15 ENCOUNTER — Other Ambulatory Visit: Payer: Self-pay

## 2019-11-15 ENCOUNTER — Telehealth: Payer: Self-pay | Admitting: Student in an Organized Health Care Education/Training Program

## 2019-11-15 ENCOUNTER — Other Ambulatory Visit: Payer: Self-pay | Admitting: Student in an Organized Health Care Education/Training Program

## 2019-11-15 DIAGNOSIS — Z471 Aftercare following joint replacement surgery: Secondary | ICD-10-CM

## 2019-11-15 DIAGNOSIS — Z96652 Presence of left artificial knee joint: Secondary | ICD-10-CM

## 2019-11-15 DIAGNOSIS — G8918 Other acute postprocedural pain: Secondary | ICD-10-CM

## 2019-11-15 MED ORDER — ONDANSETRON 4 MG PO TBDP *I*
4.0000 mg | ORAL_TABLET | Freq: Four times a day (QID) | ORAL | 0 refills | Status: DC | PRN
Start: 2019-11-15 — End: 2020-01-04

## 2019-11-15 MED ORDER — MORPHINE SULFATE ER 15 MG PO TBCR *I*
15.0000 mg | ORAL_TABLET | Freq: Two times a day (BID) | ORAL | 0 refills | Status: AC
Start: 2019-11-15 — End: 2019-11-22
  Filled 2019-11-15: qty 14, 7d supply, fill #0

## 2019-11-15 MED ORDER — ONDANSETRON 4 MG PO TBDP *I*
4.0000 mg | ORAL_TABLET | Freq: Four times a day (QID) | ORAL | 0 refills | Status: DC | PRN
Start: 2019-11-15 — End: 2019-11-15

## 2019-11-15 MED ORDER — OXYCODONE HCL 5 MG PO TABS *I*
5.0000 mg | ORAL_TABLET | ORAL | 0 refills | Status: DC | PRN
Start: 2019-11-15 — End: 2020-01-04
  Filled 2019-11-15: qty 40, 4d supply, fill #0

## 2019-11-15 MED ORDER — MELOXICAM 15 MG PO TABS *I*
15.0000 mg | ORAL_TABLET | Freq: Every day | ORAL | 0 refills | Status: AC
Start: 2019-11-15 — End: 2019-11-29
  Filled 2019-11-15: qty 14, 14d supply, fill #0

## 2019-11-15 MED ORDER — ADJUSTABLE ALUMINUM CANE MISC
1.0000 [IU] | 0 refills | Status: DC
Start: 2019-11-15 — End: 2020-04-17

## 2019-11-15 MED ORDER — ACETAMINOPHEN 500 MG PO TABS *I*
1000.0000 mg | ORAL_TABLET | Freq: Three times a day (TID) | ORAL | 0 refills | Status: DC
Start: 2019-11-15 — End: 2020-09-10
  Filled 2019-11-15: qty 100, 16d supply, fill #0

## 2019-11-15 NOTE — Progress Notes (Signed)
HOME CARE DISCHARGE PLAN    The following services have been arranged with [facility] [id number]:  __X____Nursing   __X____Physical Therapy      The agency will call to schedule first home visit date for skilled nursing and PT services requested within 24-48 hours of facility discharge, and pt/family are in agreement.    Special instructions for scheduling first visit - please call patient [phone] or [phone] to schedule visits    The following medical equipment and/or supplies have been arranged thru [facility] [phone] for delivery on 5/19 to the hospital  ___X___cane     Blood draw is not applicable _X____    Focus of care and identified patient/caregiver teaching needs at home- Left TKA    Follow-up MD appts scheduled: Dr Reggie Pile - patient to call to schedule visits within 6 weeks postoperatively      The above arrangements are based on an in-hospital evaluation. It is short-term and will be re-evaluated by the [facility] Nurse in the home on a regular basis, as per physicians order.

## 2019-11-15 NOTE — Progress Notes (Signed)
Anesthesiology Nerve Block Progress Note    Pain Consult  LOS: [id number]           Currently there is mild, moderate pain in the left TKA after surgery.    Overnight, the pain has been well controlled.    Doing well, nerve blocks fading but still effective    Significant 24hr Events:  Pruritus: no     Nausea: no     Positional Headache: no  Confusion: no   Sedation: no   Respiratory Depression: no  Shortness of Breath: no       Most Recent Vitals:  Last Filed Vitals    11/15/19 0809   BP: 135/62   Pulse: 69   Resp: 16   Temp: 36.4 C (97.5 F)   SpO2: 99%     Last Nursing documented pain:  0-10 Scale: 6 (11/15/19 0806 : Alben Deeds, RN)     Mental Status: awake, alert and appropriate    Sensory Level:     Right lower extremity has Normal sensation   Left lower extremity has Decreased sensation    Covers Pain Area: yes  Motor:    Right lower extremity Grade 5: Normal Strength/5       Left lower extremity Grade 5: Normal Strength/5      Scheduled Medications:    buPROPion  150 mg Oral QAM    famotidine  40 mg Oral BID    sucralfate  1 g Oral Nightly    docusate sodium  200 mg Oral Nightly    senna  2 tablet Oral Daily    polyethylene glycol  17 g Oral Daily    acetaminophen  1,000 mg Oral 3 times per day    morphine  15 mg Oral 2 times per day    meloxicam  15 mg Oral Daily    enoxaparin  40 mg Subcutaneous Q24H       PRN Medications:   sodium chloride  0-500 mL/hr Intravenous PRN    dextrose  0-500 mL/hr Intravenous PRN    bisacodyl  10 mg Rectal Daily PRN    traZODone  50 mg Oral QHS PRN    ondansetron  4 mg Intravenous Q6H PRN    oxyCODONE  5 mg Oral Q4H PRN    Or    oxyCODONE  10 mg Oral Q4H PRN    calcium carbonate  1,000 mg Oral TID PRN    camphor-menthol   Topical PRN    promethazine  6.25 mg Intravenous Q6H PRN       Meds Administered in the Last 24 Hours:  acetaminophen (TYLENOL) tablet 1,000 mg     Date Action Dose Route User    11/15/2019 0524 Given 1,000 mg Oral Britt Bolognese, RN     11/14/2019 2028 Given 1,000 mg Oral Britt Bolognese, RN      bupivacaine 0.25%- EPINEPHrine 1:200,000 60 mL with ketorolac 15 mg     Date Action Dose Route User    11/14/2019 1140 Given 61 mL Intra-articular (Surgical Site) Sharyn Dross, MD, PhD      buPROPion (WELLBUTRIN XL) 24 hr tablet 150 mg     Date Action Dose Route User    11/15/2019 0803 Given 150 mg Oral Colpoys, Erin, RN      ceFAZolin (ANCEF) 2,000 mg in sterile water (PF) IV syringe 20 mL     Date Action Dose Route User    11/15/2019 0235 Given 2,000 mg Intravenous Britt Bolognese, RN  11/14/2019 1732 Given 2,000 mg Intravenous Shelda Pal, RN      docusate sodium (COLACE) capsule 200 mg     Date Action Dose Route User    11/14/2019 2029 Given 200 mg Oral Isp[place], Suszanne Conners, RN      enoxaparin (LOVENOX) injection 40 mg     Date Action Dose Route User    11/15/2019 0802 Given 40 mg Subcutaneous (Abdominal Tissue) Colpoys, Erin, RN      famotidine (PEPCID) tablet 40 mg     Date Action Dose Route User    11/15/2019 0803 Given 40 mg Oral Alben Deeds, RN    11/14/2019 1732 Given 40 mg Oral Shelda Pal, RN      fentaNYL (SUBLIMAZE) 50 mcg/mL injection     Date Action Dose Route User    11/14/2019 1048 Given 50 mcg Intravenous Simonne Come, MD      haloperidol lactate (HALDOL) injection     Date Action Dose Route User    11/14/2019 1218 Given 1 mg Intravenous Simonne Come, MD      Lactated Ringers Infusion     Date Action Dose Route User    11/14/2019 1107 New Bag (none) Intravenous Simonne Come, MD      Lactated Ringers Infusion     Date Action Dose Route User    11/14/2019 1240 New Bag 125 mL/hr Intravenous Carmie Kanner, RN      meloxicam [facility]) tablet 15 mg     Date Action Dose Route User    11/15/2019 0802 Given 15 mg Oral Colpoys, Erin, RN      morphine (MS CONTIN) 12 hr tablet 15 mg     Date Action Dose Route User    11/15/2019 0806 Given 15 mg Oral Alben Deeds, RN    11/14/2019 2029 Given 15 mg Oral Britt Bolognese, RN       oxyCODONE (ROXICODONE) IR tablet 5 mg     Date Action Dose Route User    11/15/2019 0803 Given 5 mg Oral Alben Deeds, RN    11/14/2019 1734 Given 5 mg Oral Shelda Pal, RN      polyethylene glycol [facility]) powder 17 g     Date Action Dose Route User    11/15/2019 0806 Given 17 g Oral Alben Deeds, RN    11/14/2019 1732 Given 17 g Oral Shelda Pal, RN      povidone-iodine (BETADINE) in NS for irrigation (1:1)     Date Action Dose Route User    11/14/2019 1149 Given 250 mL Wound Instillation (Surgical Site) Sharyn Dross, MD, PhD      senna [facility]) tablet 2 tablet     Date Action Dose Route User    11/15/2019 0804 Given 2 tablet Oral Alben Deeds, RN    11/14/2019 1732 Given 2 tablet Oral Shelda Pal, RN      sodium chloride 0.9 % IV     Date Action Dose Route User    11/15/2019 0238 Restarted 125 mL/hr (none) Britt Bolognese, RN    11/14/2019 2142 New Bag 125 mL/hr Intravenous Britt Bolognese, RN    11/14/2019 2109 Rate/Dose Change 20 mL/hr (none) Britt Bolognese, RN    11/14/2019 2043 Restarted 125 mL/hr (none) Britt Bolognese, RN    11/14/2019 1809 Restarted 125 mL/hr (none) Ispahany, Leila, RN      sucralfate (CARAFATE) tablet 1 g     Date Action Dose Route User    11/14/2019 2029 Given 1 g Oral Britt Bolognese, RN  Lab Results:   Most recent platelet count:       Lab results: 11/02/19  0758   Platelets 182     Most recent GFR:       Lab results: 11/14/19  1653   GFR,Black 114   GFR,Caucasian 99     Most recent creatinine:       Lab results: 11/14/19  1653   Creatinine 0.59     Most recent PT/INR:       Lab results: 11/02/19  0758   Protime 11.1   INR 1.0         Assessment/Plan:   Patient is postoperative day # 1 after Procedure(s):  LEFT ARTHROPLASTY, KNEE, TOTAL, USING COMPUTER-ASSISTED NAVIGATION , with pain well controlled with a adductor canal and iPACK nerve block.       Tylenol     1 gm q 8 hrs  continue    NSAIDS     meloxicam:   continue    Opioids    For  breakthrough: oxycodone IR q 2hrs prn   continue        For long-acting (bid dosing):  MS contin  continue      Author: Simonne Come, MD as of 11/15/2019  at 10:43 AM

## 2019-11-15 NOTE — Discharge Summary (Signed)
Name: [name] MRN: [id number] DOB: [birth year]     Admit Date: 11/14/2019   Date of Discharge: 11/15/2019     Patient was accepted for discharge to   [facility] [6]         Discharge Attending Physician: Reggie Pile, MD      Hospitalization Summary    CONCISE NARRATIVE: Morrison Moerman was admitted for elective arthroplasty. Their post operative course was without significant events. Patient was discharged to home.          OR PROCEDURE: Left Total Knee Arthroplasty 11/14/2019              SIGNIFICANT MED CHANGES: Yes  Chemical DVT prophylaxis was initiated. Detailed in [facility]. Analgesics tailored to tolerance and adequate pain management.          Signed: Etter Sjogren Susan Arana, PA  On: 11/15/2019  at: 10:43 AM

## 2019-11-15 NOTE — Anesthesia Postprocedure Evaluation (Signed)
Anesthesia Post-Op Note    Patient: [name]    Procedure(s) Performed:  Procedure Summary  Date:  11/14/2019 Anesthesia Start: 11/14/2019 10:07 AM Anesthesia Stop: 11/14/2019 12:31 PM Room / Location:  [id number]_OR_[id number] / [facility]   Procedure(s):  LEFT ARTHROPLASTY, KNEE, TOTAL, USING COMPUTER-ASSISTED NAVIGATION Diagnosis:  Primary osteoarthritis of left knee [M17.12] Surgeon(s):  Reggie Pile, MD  Sharyn Dross, MD, PhD  Lanelle Bal, MD Responsible Anesthesia Provider:  Simonne Come, MD         Recovery Vitals  BP: 135/62 (11/15/2019  8:09 AM)  Heart Rate: 69 (11/15/2019  8:09 AM)  Heart Rate (via Pulse Ox): 62 (11/14/2019  3:25 PM)  Resp: 16 (11/15/2019  8:09 AM)  Temp: 36.4 C (97.5 F) (11/15/2019  8:09 AM)  SpO2: 99 % (room air) (11/15/2019  8:09 AM)   0-10 Scale: 6 (11/15/2019  8:06 AM)    Anesthesia type:  general  Complications Noted During Procedure or in PACU:  None   Comment:    Patient Location:  Med Surgical Floor  Level of Consciousness:    Recovered to baseline, awake, oriented and alert  Patient Participation:     Able to participate  Temperature Status:    Normothermic  Oxygen Saturation:    Within patient's normal range  Cardiac Status:   Within patient's normal range and stable  Fluid Status:    Stable  Airway Patency:     Yes  Pulmonary Status:    Baseline  Neuraxial Block Evaluation:    No residual motor or sensory symptoms  Pain Management:    Satisfactory to patient and adequate analgesia  Nausea and Vomiting:  None    Post Op Assessment:    Tolerated procedure well and no evidence of recall  Attending Attestation:  All indicated post anesthesia care provided         Complications Noted During Recovery Period:      None  Recovery from Anesthesia:      Recovered to baseline level of consciousness and No residual motor or sensory symptoms following neuraxial anesthesia  Condition of patient:      Satisfactory  Doing well, neurologically at baseline except  for nerve blocks still effective, very pleased with anesthesiology care.

## 2019-11-15 NOTE — Plan of Care (Signed)
Chart reviewed and social work spoke with patient prior to surgery.  Patient plans on discharge to home with home care services from [place].  Patient's spouse to assist patient at home.  A cane has been ordered and will be delivered to the room prior to discharge, patient has other recommended equipment at home.  No further social work intervention necessary at this time unless initiated by patient and/or medical staff.      Sherrilyn Rist, [place]  Pager [phone]  11/15/2019  10:21 AM

## 2019-11-15 NOTE — Discharge Instructions (Signed)
Surgical date: 11/14/2019 PR TOTAL KNEE ARTHROPLASTY [[id number]] (ARTHROPLASTY, KNEE, TOTAL)    Thank you for choosing [facility] for your joint replacement.  Please adhere to the following instructions in order to maximize your recovery.    Please follow the instructions next to any check box that has been checked i.e. [x]     Activity:   You should be weight bearing as tolerated on your operative extremity. Walk and stair-climb as tolerated.    Use assistive devices (walker, crutches, cane) as instructed by your Physical Therapist.    Do not lift, carry, push or pull objects greater than 10 pounds until cleared by your surgeon.    Driving is permitted once you are functionally cleared to do so by your surgeon AND you are no longer taking narcotic pain medications.     Diet   You may resume your previous diet, as tolerated.    Please aim for protein intake of approximately 100 grams per day to promote healing. (Meats, fish, peanut butter, and ensure/boost shakes are all good sources of protein.)   If you feel nauseous or develop vomiting, go back to just clear liquids, increasing to a soft diet and then regular solid foods as tolerated.   Smoking and/or nicotine use are associated with poor wound healing and increased risk of infection, therefore, it is recommended you not smoke or use nicotine for as long as possible following-surgery.   If you are diabetic, it is important to keep your blood sugars well-controlled, poorly controlled blood sugars are associated with poor wound healing and increased risk of infection.     Labs  [x]    None required.      Preventing Blood Clots (Deep Vein Thrombosis/Pulmonary Embolism or DVT/PE):   Please wear your anti-embolism stockings as directed by your surgeon.     You will be prescribed a blood thinner to help minimize the increased risk of blood clots following surgery. Please refer to your MEDICATION LIST for details. While taking blood  thinners, you may find you bruise or develop nose bleeds more easily. You should watch for signs of gastrointestinal bleeding, which may include dark, tar-like stools or vomit with the appearance of coffee-grounds. You should call your surgeon if you notice these signs.   You have been instructed to take 81mg  of enteric coated aspirin twice daily for 4 weeks for deep vein thrombosis prophylaxis (blood clot prevention).     Managing your Pain:   Please refer to your MEDICATION LIST for detailed instructions on your personalized pain protocol.   You should apply ice for 30 minutes every 1-2 hours and elevate your extremity above the level of your heart 3 x day and during rest to help reduce swelling and pain.   Narcotic medications are often necessary for a short time after total joint surgery. While you are taking narcotics:  o Do NOT drive or operate heavy machinery.  o Do NOT drink alcohol.  o Do NOT make any important personal, business, or economic decisions or sign legal documents.  o Narcotics can cause side effects such as sedation, lethargy, body wide itching, and constipation.  o To prevent/alleviate constipation, you should walk as much as you can tolerate and drink plenty of fluids. You should also use a stool softener such as Colace (docusate sodium) and a laxative such as Senokot (senna) unless you develop diarrhea or loose stool.  These two medications have been prescribed to you.  See your MEDICATION LIST for  details.    - If you have not had a bowel movement by your 3rd day after surgery please consider adding miralax, or taking magnesium citrate to help facilitate a bowel movement. Utilize your pharmacist, or please call your PCP/surgeon's office, if you have any concerns about the addition of these medications to your current home regime   You should work to gradually wean off of narcotic pain medications as your pain improves, first by decreasing the dose, and then by increasing the amount of  time in between doses.     It is important to dispose of all narcotics when you are certain you no longer need them. The first choice for all drug disposal is an authorized medication disposal bin which are located at the [place], most retail pharmacies, and law enforcement stations. If you are unable to access one of these bins, you should flush un-used narcotics down the toilet.    Caring for your incision:    Keep your incision clean. Do not apply any lotions, creams, or ointments to your incision.   Your incision may be left open to air as long as there is no drainage.   If you have drainage: Call your surgeons office and do not shower. Cover and start daily dry dressing changes.     So long as there is no drainage, you may shower with incision open to air.  Let the soapy water run over the incision, rinse and pat dry.  Do not scrub, do not spray shower hose directly on incision. Do not soak your wound in a bath, whirlpool, or in any kind of standing water.    Your surgical site has been closed with the [place]. The [place] is a combination of a mesh and a liquid adhesive that allows the incision to be held together during the healing process.    Prior to removal do not scratch, rub or pick at the mesh. You may notice that lint may become adherent to the [place]; this is only a cosmetic problem and does not affect the wound healing process.     In the event you notice that the [place] is beginning to loosen or may be coming off, contact your surgeons office. If the edges of the [place] begin to lift up you may trim with scissors.    The [place] should remain in place for 3 weeks post-operatively. After 3 weeks you should gently lift off the mesh using the following instructions:   Removal Instructions:   Wash hands   Gently grasp the edge of the Dermabond Prineo skin closure  system at one edge of the wound. If the edge of the device is still adhered to the skin, gently pick at the edge until it begins to peel away from the skin.    Slowly peel the Dermabond Prineo system away from the skin along the length of the wound. Do not pull mesh straight up from skin. The Dermabond Prineo system should be pulled back along the length of the wound close to the skin. Use the other hand to stabilize the wound as the mesh is peeled off. A petroleum jelly (ie. Vaseline) may be used to make removal of the mesh layer easier.    Once the entire length of Dermabond Prineo system has been removed, discard the device in the trash.    Any residual adhesive and/or dried wound scabs can be  cleaned with mild soap and water solution and gentle pressure.     Follow-up:   Call your surgeons office at [phone] to make a follow-up appointment.     You or your caregiver should call the surgeons office at [phone] (or [phone] after business hours) for:   Redness around incisions   Continuous drainage or bleeding from incisions (a small amount of drainage is expected initially)   Temperature above 101 F   Increasing swelling, numbness, or pain not controlled by your pain medications   Any other worrisome condition  There is always someone on call, 24/7 to address your concerns.  Please dont hesitate to reach out with your questions. In the event that you are unable to contact the office or the answering service, please go to [facility] for evaluation.    Your surgeon requests that you do not receive the Covid vaccine until 4 weeks have passed since your surgical date

## 2019-11-15 NOTE — Progress Notes (Signed)
Current state of emergency related to COVID-19. Documentation completed per hospital emergency response standard.

## 2019-11-15 NOTE — Progress Notes (Signed)
Patient has cleared PT, OT, is voiding spontaneously, and has had x-ray completed.  Patient has achieved adequate pain control and is amenable to discharge.  Discharge instructions reviewed with patient and patient stated understanding.  IV removed.  Patient discharged to home as per order. Malacai Grantz Wendlandt, RN

## 2019-11-15 NOTE — Progress Notes (Addendum)
Physical Therapy    Treatment session completed.    Cleared    Discharge recommendation:  Intermittent supervision/assist, Home PT, Anticipate return to prior living arrangement  Equipment recommendations upon discharge: Cane  Mobility recommendations for nursing while in hospital: Supervision with RW    PT Adult Assessment - 11/15/19 0907        PT Tracking    PT TRACKING  PT Assigned     Type of Session  follow up/treatment     SW Request?  No        Treatment Day    Treatment Day  2        Precautions/Observations    Precautions used  Yes     Weight Bearing Status  Left Lower Extremity - Weight bearing as tolerated     LDA Observation  None     Was patient wearing a mask?  Yes     PPE worn by Warehouse manager;Face Shield;Mask     Fall Precautions  General falls precautions;Patient educated to use call light for nursing assist prior to getting up;White board updated with appropriate mobility status        Pain Assessment    *Is the patient currently in pain?  Yes     Pain (Before,During, After) Therapy  Before     0-10 Scale  2;3     Pain Location  Knee     Pain Orientation  Left;Anterior     Pain Descriptors  Aching;Sharp     Pain Intervention(s)  Cold applied;Repositioned        Cognition    Cognition  Tested     Arousal/Alertness  Appropriate responses to stimuli     Orientation  Alert and oriented x4     Following Commands  Follows simple commands without difficulty        Bed Mobility    Bed mobility  Not tested     Additional comments  Pt. has no concern with getting in and out of bed.         Transfers    Transfers  Tested     Sit to Stand  Modified independent (device);Verbal cues     Stand to sit  Modified independent (device);Verbal cues     Transfer Assistive Device  rolling walker     Additional comments  Pt. was given verbal cues for proper hand placement for safe transfers.         Mobility    Mobility  Tested     Weight Bearing Status  LLE     Weight Bearing Status LLE  Left Lower Extremity - Weight  bearing as tolerated     Gait Pattern  Decreased cadence;Decreased stance time L;Decreased L step height;Decreased L step length;Decreased R step height;Decreased R step length;2 point     Ambulation Assist  Modified independent (device)     Ambulation Distance (Feet)  400     Ambulation Assistive Device  rolling walker     Stairs Assistance  Modified independent (device)     Stair Management Technique  Step to pattern;No rail;With cane     Number of Stairs  6     Additional comments  Pt. was educated on posture and proper gait sequence with stairs. Completed safely with no concerns. Pt. reports husband will assist getting up stairs, Probation officer provided hand held assistance.         Family/Caregiver Training`    Patient/Family/Caregiver training  Yes     Patient  training  Discharge planning;Role of physical therapy in hospital and plan for evaluation and follow up;Post-operative precautions;PT plan of care after evaluation;Purpose and importance of icing and recommendations for ice frequency and duration;Use of assistive device;Equipment recommendations for discharge;Benefits of oob activity and mobility during hospitalization, including frequency of ambulation and change of position;Call don't fall, purpose of bed/chair alarm, and recommendation for nursing assistance during all oob mobility;Breathing technique during activity;Therapeutic exercises, including recommendation of frequency of therapeutic exercises to be completed by patient throughout day        Balance    Balance  Tested     Sitting - Static  Independent ;Unsupported     Standing - Static  Independent;Supported     Standing - Dynamic  Independent;Supported        PT AM-PAC Mobility    Turning over in bed?  3     Sitting down on and standing up from a chair with arms?  3     Moving from lying on back to sitting on the side of the bed?  3     Moving to and from a bed to a chair?  3     Need to walk in hospital room?  4     Climbing 3 - 5 steps with a  railing?  4     Total Raw Score  20     Standardized Score - Calculated  47.67     % Functional Impairment - Calculated  36%        Additional Comments    Additional comments  Educated pt. on HEP and proper technique and frequency.         Assessment    Brief Assessment  Patient demonstrates adequate mobility skills to return home     Problem List  Impaired LE ROM;Impaired LE strength;Impaired balance;Impaired transfers;Impaired ambulation;Impaired functional status;Impaired mobility;Impaired bed mobility;Impaired stair navigation     Patient / Family Goal  return home         Plan/Recommendation    Treatment Interventions  Restorative PT     PT Frequency  5-7x/wk     Mobility Recommendations  Supervision with RW     Referral Recommendations  OT;SW     Discharge Recommendations  Intermittent supervision/assist;Home PT;Anticipate return to prior living arrangement     PT Discharge Equipment Recommended  Cane     Assessment/Recommendations Reviewed With:  Patient;Nursing     Next PT Visit  cleared        Time Calculation    PT Timed Codes  26     PT Untimed Codes  0     PT Unbilled Time  0     PT Total Treatment  26        Plan and Onset date    Plan of Care Date  11/14/19     Onset Date  11/14/19     Treatment Start Date  11/14/19         AMPAC Score Interpretation:  Adapted from Worthy Flank, et al Association of AM-PAC 6 Clicks Basic Mobility and Daily Activity Scores with discharge destination, 2021  AM-PAC Raw Score of > 16 (standardized score > 40.78, % impairment <  54%) indicates a high likelihood of discharge to home     Adapted from [place] Prediction of disposition within 48-hours of hospital admission using patient mobility scores, 2019  AM-PAC raw score  ? 15 (standardized score ? 39, % impairment  < 58%) indicates a  high probability of a discharge to home  AM-PAC raw score 9-14 (standardized score ? 31, % impairment 81%-61%) and age < 82 is more likely to discharge to home  AM-PAC raw score 9-14  (standardized score ? 31, % impairment 81%-61%) and age > 80 is more likely to require post-acute care  AM-PAC raw score < 9 (standardized score < 31, % impairment > 81%) indicates pt is more likely to require post-acute care    Mehamed Abdurahim, SPTA       I was present during the entire physical therapy treatment.  I have reviewed and agreed with the physical therapy assistant student's treatment notes as well as patient education as documented in the chart.  Cora Daniels, PTA

## 2019-11-15 NOTE — Telephone Encounter (Signed)
Orthopaedic Surgery Brief Note    Patient calls answering service d/t post op nausea    She is s/p L TKA 11/14/19 with Dr. Ernst Breach. Did not have any issues with nausea post op until she got home earlier today. Had emesis x 1 earlier after dinner, and again while we were on the phone. Feeling better after emesis now. Thinks related to oxycodone, as she took 2 pills instead of 1 due to some inc in pain since being home.     Prescribed zofran prn to her preferred pharmacy. She will try to take it 30 min before narcotics. We discussed she should hold her PO morphine for now. Can also space out medications to decrease pill burden at once. Try to stay hydrated. She is in agreement. She will call back if zofran fails to help and we can try a different anti-nausea medication if needed.    Hyacinth Meeker, MD  Orthopaedic Surgery  11/15/2019, 7:51 PM

## 2019-11-15 NOTE — Progress Notes (Addendum)
Orthopaedic Surgery Progress Note for 11/15/2019    Patient:[name]  MRN: [id number]  DOA: 11/14/2019    Subjective: NAEO, AVSS. Pain well controlled overnight. Tolerating oral intake. Voiding adequately. Ambulated with physical therapy. Patient denies fever/chills, SOB, chest pain, nausea/vomiting, or numbness/weakness.     Objective:  Temp:  [36.2 C (97.2 F)-37.3 C (99.1 F)] 36.4 C (97.5 F)  Heart Rate:  [52-95] 52  Resp:  [12-21] 16  BP: (112-140)/(52-73) 116/58    Recent Labs   Lab 11/14/19  1653   Hemoglobin 12.9   Hematocrit 42     Recent Labs   Lab 11/14/19  1653   Sodium 141   Potassium 4.3   Chloride 106   CO2 24     No components found with this basename: BUN, CREATININE, LABGLOM, GLUCOSE, CALCIUM    No results for input(s): INR, PTT in the last 168 hours.    No components found with this basename: APTT  No results for input(s): ESR, CRP in the last 168 hours.    Exam:  NAD  No respiratory distress    LLE: ace taken down, Prineo c/d/i, SILT sp/dp/saph/sur/tib nerve distributions, SILT over tips of toes, +ADF/APF/EHL/Toe f/e, brisk CR, palpable DP    Assessment and Plan:  64 y.o. female with PMH GERD, depression, L knee OA admitted on 11/14/2019 for surgery now POD#1 s/p L TKA    Active hospital problems:   1. Post-operative recovery  WBAT LLE  Pain control: multimodal, no gabapentin   Dietary nutrition supplements adult: HH; Ensure High Protein  Diet regular  Bowel regimen: senna and colace  DVT ppx with Lovenox, home on ASA 81 mg BID  Prineo dressing  Post op Ancef x 24 hrs  Post op Radiographs reviewed  PT/OT/OOB  Dispo: home pending PT, plan today     Sharyn Dross, MD, PhD on 11/15/2019 at 5:05 AM     As a licensed physician I herby certify this patient's eligibility for home health services:    The patient needs intermittent Skilled Nursing care and Physical Therapy    The patient is confined to the home (that is, homebound)    - in need the aid of supportive devices such as  crutches, canes, wheelchairs, and walkers    - needs  the assistance of another person to leave their place of residence   - Leaving home requires a considerable and taxing effort    Absences from the home are one or more of the following:   -infrequent;  ?for periods of relatively short duration;  ?for the need to receive health care treatment;  ?for religious services;  ?to attend adult daycare programs; or  ?for other unique or infrequent events (e.g., funeral, graduation, trip to the barber).    A plan of care has been established and will be periodically reviewed by me, a licensed physician    Services will be furnished while the individual  is under the my care     This is a face-to-face encounter during this patient's current hospital admission for joint replacement. The certification period begins at the time of hospital discharge.    Patient seen and evaluated. Agree with above assessment, findings and plan.  Reggie Pile, M.D.

## 2019-11-15 NOTE — Progress Notes (Signed)
Occupational Therapy Evaluation    Initial Eval Completed.  Patient is a 64 y.o.female who presents with S/p L TKA. Pt is WBAT and using compression stockings.Pt demonstrates safe and adequate independence with ADL's to return home with intermittent supervision/assist from family as needed.  Marland Kitchen    Discharge recommendation:  Prior Living Environment, Intermittent supervision (intermittent assist)   Equipment recommendations:  (none)   Hospital Stay Recommendations for nursing: Ax1 with self cares, RW to and from bathroom    Past Medical History:   Diagnosis Date    Abdominal pain 07/19/2012    Anxiety 3/12    Benign neoplasm of colon     Calcaneal bursitis 01/14/2012    right    Chronic cholecystitis 10/2012    Chronic GERD [id number]    Complication of anesthesia     PONV    Current use of proton pump inhibitor 06/04/2019    Depression     Diverticulosis 11/27/2013    mild    Hiatal hernia 12/12/2018    Internal hemorrhoids     Microhematuria 07/14/2019    Migraine     q2-3 months    Migraine headache 04/05/2003          Mitral valve prolapse     OA (osteoarthritis)     left knee    Osteoarthritis of right knee 04/28/2017    Osteoarthritis, knee 12/03/2010    PONV (postoperative nausea and vomiting)     Primary osteoarthritis of left knee 08/26/2015    Rosacea 04/05/2003          Ruptured cerebral aneurysm     age 66    Sleep apnea     does not use CPAP    Syncope     d/t migraines    Urinary tract infection     frequent  3 this year    Vertigo     Vitamin D deficiency 07/23/2010       Past Surgical History:   Procedure Laterality Date    CESAREAN SECTION, CLASSIC  '86 '88    spinal    CHOLECYSTECTOMY, LAPAROSCOPIC  11/16/2012    COLONOSCOPY  2012    COLONOSCOPY  11/24/2013    10/2018    COLONOSCOPY  05/19/2019    repeat 04/2024    KNEE ARTHROSCOPY Left 06/19/11    strabismus correction Right 1972, 1993    x2--child + 17 yo    [place]    4 wisdom and B/L bicuspids       *Bold  Indicates co-morbidities affecting treatment and recovery    Occupational profile relating to the present problem:   none identified    In addition to the [place] and surgical history listed above, the comorbidities affecting treatment/recovery:   Joint pain: L knee   Total knee replacement      Performance deficits that result in activity limitations and/or performance restrictions:      Modification of tasks needed or assistance with assessments necessary to enable completion of evaluation component:    Patient complexity:  low level as indicated by  personal factors, environmental factors and comorbidities in addition to their impairments found on physical exam.    ([place]) Occupational Therapy Assessment - 11/15/19 1311        OT Tracking    OT Tracking  OT Discontinue     Type of Session  evaluation     SW Request?  No        OT Last  Visit    Visit (#) of Five  1        Precautions    Precautions used  Yes     Weight Bearing Status  Left Lower Extremity - Weight bearing as tolerated     Fall Precautions  General falls precautions        Home Living (Prior to Admission)    Prior Living Situation  Reported by patient;Obtained via chart review     Type of Home  2 Story home     Location of Bedroom  First floor     Location of Bathroom  First floor     # Steps to Enter Home  2     Bathroom Shower/Tub  Walk-in Environmental consultant in [place] Accessibility  Accessible via walker        Prior Function    Prior Function  Reported by patient;Obtained during chart review     Level of [place] with ADLs;Independent with assistive device;Independent with homemaking with ambulation;Working;Driving    husband assists    Lives With  [employer] Help From  Independent;Family     IADL  Needs assistance     Additional Comments  Pt reports spouse to assist with ADLs and IADLs as needed        Pain Assessment    *Is the patient  currently in pain?  Yes     Pain Location 1  Knee     Orientation of Pain 1  Left     Pain Scale 1  0     Pain 1  Before     Pain Scale 2  4     Pain 2  During;After     Pain Intervention(s) 2  Cold applied;Repositioned;Refer to nursing for pain management        Vision     Current Vision  Wears corrective lenses        Cognition    Cognition  No deficit noted     Level of Alertness  Appropriate responses to stimuli     Orientation  Alert and oriented x4        UE Assessment    UE Assessment  Full AROM RUE;Full AROM LUE     Additional Comments  BUE strength WFL for ADLs        Bed Mobility    Supine to Sit  Modified Independent;HOB flat     Additional Comments  Pt greeted in bed and left in recliner. Call bell in reach. Spouse present for portion of session and remains following session        Functional Transfers    Sit to Stand  Modified Independent     Stand to Sit  Modified Independent     Toilet Transfers  Modified Independent     Additional Comments  Pt is modified independent with RW to and from bathroom. NO LOB.        Balance    Sitting - Static  Independent      Sitting - Dynamic  Independent     Standing - Static  Independent     Standing - Dynamic  Independent     Standing Tolerance during Functional Task  good        ADL Assessment    Eating  Independent     Grooming  Modified independent     Where Grooming Assessed  Standing at sink     Assist Needed With:  Increased time to complete     Areas Assessed:  Washing face;Washing hands     UE Dressing  Independent     LE Dressing  Minimal Assist     Where  LE Dressing Assessed  Chair     Assist Needed With:  Socks    assist with socks and TEDs    Equipment Used  Walker     Bathing  Modified independence     Assist Needed With  Increased time to complete ;Decreased balance    compensatory techniques    Where Bathing Assessed  Standing at sink     Toileting  Modified independence     Where Toileting Assessed  Commode over toilet     Assist needed with   Increased time to complete     Additional Comments  Pt is modified independent with basic ADLs with spouse to assist with distal aspects as needed        IADL Assessment    Additional Comments  Spouse to assist as needed        Activity Tolerance    Endurance  Endurance does not limit participation in activity        Functional Outcomes Measures    Functional Outcome Measures  Yes        OT AM-PAC Self Care    Putting on and taking off regular lower body clothing?  3     Bathing (including washing, rinsing, drying)?  4     Toileting, which includes using toilet, bedpan, or urinal?  3     Putting on and taking off regular upper body clothing?  4     Taking care of personal grooming such as brushing teeth?  4     Eating Meals  4     Total Raw Score  22     CMS Score - Calculated  25.80%        Additional Comments    Additional Comments  Pt denies concerns re d/c home        Assessment    Assessment  Impaired ADL status;Impaired balance;Impaired self-care transfers    use of compensatory techniques       OT Treatment Assessment    Assessment  D/C acute OT        Plan    OT Frequency  One-time visit     No acute OT needs  Pt demonstrates adequate ADL skills for return to prior living environment;No acute OT goals identified    with intermittent supervision/assist       Recommendation    OT Discharge Recommendations  Prior Living Environment;Intermittent supervision    intermittent assist    OT Discharge Equipment Recommended  --    none    Hospital Stay Recommendations  Ax1 with self cares, RW to and from bathroom        Multidisciplinary Communication    Multidisciplinary Communication  RN,PT        Time Calculation    OT Untimed Codes  24     OT Total Treatment  24         The physicians co-signature indicates the evaluation has been reviewed and they are in agreement with the occupational therapy plan of care.    AMPAC Score Interpretation:  Adapted from Worthy Flank, et al Association of AM-PAC 6 Clicks Basic  Mobility and  Daily Activity Scores with discharge destination, 2021  AM-PAC Raw Score of > 19 (standardized score > 40.22 %) indicates a high likelihood of discharge to home     Adapted from [place] "Validity of h AM-PAC "6- Clicks" Inpatient Daily Activity and Basic Mobility Short Forms (2014)   AM-PAC raw score  ? 18 (standardized score ? 39, % impairment  < 47%) indicates a high probability of a discharge to home  AM-PAC raw score  ? 13 (standardized score ? 32.03, % impairment 63%) indicates pt is more likely to require IRF/SNF R  AM-PAC raw score <12 (standardized score < 30.6 % impairment > 66%) indicates pt is more likely to require [place] OTR/L  Pager [phone]

## 2019-11-16 ENCOUNTER — Telehealth: Payer: Self-pay

## 2019-11-16 NOTE — Telephone Encounter (Signed)
[employer] alerts generated in post op course.  Call to pt to review concerns and follow up.   Pain  Not taking pain meds as ordered and pain greater than 8  After ride home yesterday pt vomited x 2  was unable to take pain meds.  Called to resident on call and zofran qid prn started. Much better this am. Tolerating eggs and toast today with zofran on board. She will take pain meds only 1 tab prn oxy with pain greater than 5 with applesauce.   RN and PT out today and updated pt.   Calf pain  RN saw pt her today ted was bunched up . Reviewed teds on in am off at hs. Ace wrap on over surgical knee  No bm since surgery passing gas. Pt will add miralex tomm if no bm.   Poor protein intake was vomiting last night slightly better.

## 2019-12-07 ENCOUNTER — Encounter: Payer: Self-pay | Admitting: Gastroenterology

## 2019-12-20 ENCOUNTER — Encounter: Payer: Self-pay | Admitting: Gastroenterology

## 2019-12-21 ENCOUNTER — Encounter: Payer: Self-pay | Admitting: Orthopedic Surgery

## 2019-12-29 ENCOUNTER — Ambulatory Visit
Admission: RE | Admit: 2019-12-29 | Discharge: 2019-12-29 | Disposition: A | Discharge status: Home / Self Care | Payer: BLUE CROSS/BLUE SHIELD | Source: Ambulatory Visit | Attending: Orthopedic Surgery | Admitting: Orthopedic Surgery

## 2019-12-29 DIAGNOSIS — M85862 Other specified disorders of bone density and structure, left lower leg: Secondary | ICD-10-CM

## 2019-12-29 DIAGNOSIS — M1712 Unilateral primary osteoarthritis, left knee: Secondary | ICD-10-CM | POA: Insufficient documentation

## 2019-12-29 DIAGNOSIS — Z471 Aftercare following joint replacement surgery: Secondary | ICD-10-CM

## 2019-12-29 DIAGNOSIS — Z96652 Presence of left artificial knee joint: Secondary | ICD-10-CM

## 2020-01-04 ENCOUNTER — Ambulatory Visit: Payer: BLUE CROSS/BLUE SHIELD | Admitting: Orthopedic Surgery

## 2020-01-04 DIAGNOSIS — M1712 Unilateral primary osteoarthritis, left knee: Secondary | ICD-10-CM

## 2020-01-04 NOTE — Progress Notes (Signed)
Telephone Visit     This is an established patient visit.    Location of Telemedicine Provider: hospital / clinical location    Reason for visit: Follow-up left total knee replacement    Patient's problem list, allergies, and medications were reviewed and updated as appropriate. Please see the EHR for full details.    Physical Exam:    This visit was performed during a pandemic event, thus the physical exam was not performed.    The plan was discussed with the patient and the patient/patient rep demonstrated understanding to the provider's satisfaction.    Consent was previously obtained from the patient to complete this telephone consult; including the potential for financial liability.    11-20 minutes were spent on the phone with the patient, patient representatives, and/or other attendees.       Lakendria Nicastro, PA    ADULT POST OP TKR    [name] presents today for a post-op visit following aleft total knee arthroplasty performed 11/14/2019. Currently patient is doing rehab at[facility] rehabilitation facility, outpatient.  Patient states pain is 2 out of 10.  The patient is ambulating without assistive devices.  The surgical wound has been dry. Range of motion has been progressing well.   Patient states knee flexion has reached 115 degrees of flexion.  States incision is well-healed.  She is able to ambulate up and down stairs on a reciprocal basis.  She has been riding an exercise bike.  She has returned to work, but works from home.    Radiographs, 3 views left knee were obtained on 12/29/2019 and personally reviewed. These show a total knee arthroplasty  with normal alignment.    Impression: Left TKR-11/14/2019.     Plan:  The patient's x-ray findings and underlying diagnosis reviewed.  Patient states she is happy with her recovery thus far.  We will continue with supervised physical therapy as per TKR protocol.  Importance of following dental antibiotic prophylaxis precautions reviewed.  Acceptable  low-impact exercise activities were once again reviewed.  All ambulatory activities to be performed within the confines of comfort.  Additional questions invited and answered.      Follow up in 3 months or sooner as needed for reexamination of left knee.      Orders Next Visit: 3 views left knee x-ray.  Answers for HPI/ROS submitted by the patient on 01/01/2020  What is your goals for today's visit?: Post operative visit  Was this the result of an injury?: No  Please describe the quality of your pain: : dull ache  Is this a work related condition? : No  Current work status: : usual activities  Fever: No  Chills: No  Weight loss: No  Malaise/fatigue: No  Rash: No  Dry skin: No  Itching: No  Wound : No  Headache: No  Hearing loss: No  Sore throat: No  Dental problem: No  Blindness: No  Visual disturbance: No  Chest pain: No  Palpitations: No  Leg cramps with exercise: No  Leg swelling: Yes  Fainting: No  Heartburn: Yes  Nausea: No  Vomiting: No  Diarrhea: No  Constipation: No  Blood in stool: No  Cough : No  Apnea: Yes  Shortness of breath: No  Frequency: No  Urgency: No  Blood in urine: Yes  Incomplete emptying: No  Falls: No  Neck pain: No  Back pain: No  Joint swelling: Yes  Muscle spasms: No  Muscle cramps: No  Easy to bruise/bleed: No  Frequent infections: No  Numbness: Yes  Tingling: No  Seizures: No  Loss of consciousness: No  Weakness: No  Depression: No  Nervous/anxious: No  Memory loss: No

## 2020-01-05 ENCOUNTER — Other Ambulatory Visit: Payer: Self-pay | Admitting: Primary Care

## 2020-02-14 ENCOUNTER — Encounter: Payer: Self-pay | Admitting: Gastroenterology

## 2020-02-21 ENCOUNTER — Other Ambulatory Visit: Payer: Self-pay | Admitting: Gastroenterology

## 2020-03-27 ENCOUNTER — Encounter: Payer: Self-pay | Admitting: Primary Care

## 2020-03-27 MED ORDER — ALPRAZOLAM 0.5 MG PO TABS *I*
0.2500 mg | ORAL_TABLET | Freq: Every evening | ORAL | 0 refills | Status: DC | PRN
Start: 2020-03-27 — End: 2020-12-06

## 2020-04-01 ENCOUNTER — Encounter: Payer: Self-pay | Admitting: Primary Care

## 2020-04-01 ENCOUNTER — Ambulatory Visit: Payer: BLUE CROSS/BLUE SHIELD | Admitting: Primary Care

## 2020-04-01 VITALS — BP 132/80 | HR 76 | Temp 97.8°F | Wt 233.0 lb

## 2020-04-01 DIAGNOSIS — F32A Depression, unspecified: Secondary | ICD-10-CM

## 2020-04-01 DIAGNOSIS — G4700 Insomnia, unspecified: Secondary | ICD-10-CM

## 2020-04-01 LAB — PCMH DEPRESSION ASSESSMENT

## 2020-04-01 MED ORDER — BUPROPION HCL 300 MG PO TB24 *I*
300.0000 mg | ORAL_TABLET | Freq: Every morning | ORAL | 1 refills | Status: DC
Start: 2020-04-01 — End: 2020-07-05

## 2020-04-01 NOTE — Progress Notes (Signed)
CCf/u      HPI  Brother passed away in his sleep in August--was her twin--he had etoh abuse and lived with her mom who passed away in spring--has been having a hard time and awakening with nightmares of trying to save her brother from drowning, she had not been as close the last few years, the alprazolam is helping and taking it every other night, she is now able to function and sleep, she denies any thoughts of hurting self or others she did have some concerns of hearing loss in the past with bupropion but is willing to go on a higher dose she is going to also consider seeing some psychologist.  She wishes to continue to work as it keeps her mind off things.      Review of systems    Feeling fine  No fever  No headache  No chest pain  No shortness of breath          Current Outpatient Medications   Medication Sig Dispense Refill    ALPRAZolam (XANAX) 0.5 MG tablet Take 0.5-1 tablets (0.25-0.5 mg total) by mouth nightly as needed for Anxiety  Max daily dose: 0.5 mg -Beware of sedation 30 tablet 0    buPROPion (WELLBUTRIN XL) 150 MG 24 hr tablet TAKE 1 TABLET BY MOUTH EVERY MORNING 90 tablet 0    Misc. Devices (ADJUSTABLE ALUMINUM CANE) MISC By 1 units no specified route continuously Ht 11/14/19 : 1.651 m (5\' 5" ) Wt 11/14/19 : 103.9 kg (229 lb) TIR:[id number] ICD10:17.12 1 each 0    acetaminophen (TYLENOL) 500 mg tablet Take 2 tablets (1,000 mg total) by mouth 3 times daily . For Post-op pain. 100 tablet 0    famotidine (PEPCID) 40 MG tablet Take 40 mg by mouth 2 times daily      sucralfate (CARAFATE) 1 GM tablet Take 1 g by mouth nightly       rizatriptan (MAXALT) 5 MG tablet Take 1 tablet (5 mg total) by mouth as needed for Migraine   May repeat in 2 hours if needed.  Max 6 tabs/day. 10 tablet 3    Calcium 500 MG tablet Take 1 tablet by mouth daily         Cholecalciferol (VITAMIN D3) 1000 UNIT tablet Take 1,000 Units by mouth every morning         fluticasone (FLONASE) 50 MCG/ACT nasal spray Spray 1 spray  into nostril 2 times daily 48 g 3     No current facility-administered medications for this visit.         Vitals:    04/01/20 0832   BP: 132/80   Pulse: 76   Temp: 36.6 C (97.8 F)   Weight: 105.7 kg (233 lb)         EXAM  Gen: patient appears comfortable and in no distress  Psych: Normal affect  Gait: Grossly normal          A/P Pleasant woman seen here for depression and insomnia doing a little better taking the alprazolam every other night and getting some good sleep she feels it is helping.  She would be interested in going on a higher dose of bupropion which I wrote for she is aware it might also be helpful to see a psychologist I gave her the names of several.  No thoughts hurting self or others she has appointment with me in December but will follow up sooner if needed.  Thank you total time of care date of service  25 minutes

## 2020-04-08 ENCOUNTER — Ambulatory Visit
Admission: RE | Admit: 2020-04-08 | Discharge: 2020-04-08 | Disposition: A | Discharge status: Home / Self Care | Payer: BLUE CROSS/BLUE SHIELD | Source: Ambulatory Visit | Attending: Orthopedic Surgery | Admitting: Orthopedic Surgery

## 2020-04-08 ENCOUNTER — Encounter: Payer: Self-pay | Admitting: Orthopedic Surgery

## 2020-04-08 ENCOUNTER — Ambulatory Visit: Payer: BLUE CROSS/BLUE SHIELD | Admitting: Orthopedic Surgery

## 2020-04-08 VITALS — BP 120/78 | Ht 65.0 in | Wt 232.0 lb

## 2020-04-08 DIAGNOSIS — M25562 Pain in left knee: Secondary | ICD-10-CM | POA: Insufficient documentation

## 2020-04-08 DIAGNOSIS — Z96652 Presence of left artificial knee joint: Secondary | ICD-10-CM

## 2020-04-08 DIAGNOSIS — M1711 Unilateral primary osteoarthritis, right knee: Secondary | ICD-10-CM | POA: Insufficient documentation

## 2020-04-08 NOTE — Progress Notes (Signed)
Left Knee  What is your goal for today's visit?:  Surgery follow up visit  Was this the result of an injury?: No    Pain level:  2/10  Pain quality:  Sore  Diagnostic workup:  X-ray  Treatments tried:  Acetaminophen, corticosteroid injection, NSAIDs, physical therapy and surgery  Progression since onset:  Resolved  Work related conditon?: No    Work status:  Usual Brewing technologist Complaint: Left Total Knee Replacement    Relevant History: This  patient is seen in follow up of left Total Knee Replacement. Has been functioning well. Is following recommendations. Mild complaints of pain. No instability or other mechanical symptoms such as locking. Occasional clicking. No new contributory history.    Past Health - - marked as reviewed in Epic records    Physical examination: This individual remains pleasant and co-operative, alert and oriented, normocephalic, atraumatic, neck symmetric. Gait is non-antalgic.The wound is well healed without other skin changes in the area. There is no significant local tenderness or warmth. Range of motion satisfactory at this stage.0-120 degrees.     No significant discomfort with rotation or axial compression. Collaterals stable. No calf tenderness, no ankle edema, no distal neurovascular deficit.    X-Rays: Include AP Standing, tunnel, lateral and sunrise views of the Knee. These show stable position of the components with no evidence of loosening or wear. No abnormality in the surrounding soft tissue or bone.    Assessment: Left Total Knee replacement.    Plan: Residual symptoms should settle over next few months. Continue activity as tolerated. Avoid significant and sustained impact activity or heavy weights. Advised regarding ongoing strengthening and conditioning. Advised regarding next recommended review 5 year sor if concerns.  Answers for HPI/ROS submitted by the patient on 04/02/2020  Fever: No  Chills: No  Numbness: No  Tingling: No

## 2020-04-17 ENCOUNTER — Encounter: Payer: Self-pay | Admitting: Primary Care

## 2020-04-17 ENCOUNTER — Telehealth: Payer: Self-pay | Admitting: Primary Care

## 2020-04-17 ENCOUNTER — Ambulatory Visit: Payer: BLUE CROSS/BLUE SHIELD | Admitting: Primary Care

## 2020-04-17 VITALS — BP 127/67 | HR 71

## 2020-04-17 DIAGNOSIS — J069 Acute upper respiratory infection, unspecified: Secondary | ICD-10-CM

## 2020-04-17 DIAGNOSIS — R0982 Postnasal drip: Secondary | ICD-10-CM

## 2020-04-17 DIAGNOSIS — J040 Acute laryngitis: Secondary | ICD-10-CM

## 2020-04-17 MED ORDER — BENZONATATE 100 MG PO CAPS *I*
100.0000 mg | ORAL_CAPSULE | Freq: Three times a day (TID) | ORAL | 0 refills | Status: DC | PRN
Start: 2020-04-17 — End: 2020-06-25

## 2020-04-17 NOTE — Progress Notes (Signed)
CCuri      HPI  Began 8 days ago--initially a sore throat and body aches and then facial pain and tooth pain--lasted for several days and some nausea--the head congestion is still there but improved after 5 days, no fevers,the sore throat got but voice is off and feels like a lump in chest when lying down and better when upright, positive cough with yellow phlegm, no sob, cough is keeping her up at night, feels like the post nasal drip is pooling in upper chest and then in am coughing some of it up, test self 7 days ago with otc covid test neg and then 4 days ago formal pcr test at pharmacy.taking dayquil thus far for symptoms    Mood is doing better with higher dose bupropion      Review of systems    Feeling better each day  No fever  No chest pain  No shortness of breath   cough  No change of appetite          Current Outpatient Medications   Medication Sig Dispense Refill    buPROPion (WELLBUTRIN XL) 300 MG 24 hr tablet Take 1 tablet (300 mg total) by mouth every morning  Swallow whole. Do not crush, break, or chew.--new strength 90 tablet 1    acetaminophen (TYLENOL) 500 mg tablet Take 2 tablets (1,000 mg total) by mouth 3 times daily . For Post-op pain. 100 tablet 0    famotidine (PEPCID) 40 MG tablet Take 40 mg by mouth 2 times daily      sucralfate (CARAFATE) 1 GM tablet Take 1 g by mouth nightly       Calcium 500 MG tablet Take 1 tablet by mouth daily         Cholecalciferol (VITAMIN D3) 1000 UNIT tablet Take 1,000 Units by mouth every morning         ALPRAZolam (XANAX) 0.5 MG tablet Take 0.5-1 tablets (0.25-0.5 mg total) by mouth nightly as needed for Anxiety  Max daily dose: 0.5 mg -Beware of sedation 30 tablet 0    fluticasone (FLONASE) 50 MCG/ACT nasal spray Spray 1 spray into nostril 2 times daily 48 g 3    rizatriptan (MAXALT) 5 MG tablet Take 1 tablet (5 mg total) by mouth as needed for Migraine   May repeat in 2 hours if needed.  Max 6 tabs/day. 10 tablet 3     No current  facility-administered medications for this visit.       Temp 98.6  Vitals:    04/17/20 1540   BP: 127/67   Pulse: 71         EXAM  Gen: patient appears comfortable and in no distress  HEENT ear canals and TMs both normal nasal mucosa with mucoid discharge present, oropharynx clear moist with only mild posterior erythema neck supple no adenopathy  Cv: RRR, Nl S1,S2  Resp: CTA b/l, no rales, no rhonchi, no wheezing no tachypnea  Ext: no edema          A/P Pleasant woman with signs symptoms suggestive of a viral infection that has improved some with less head congestion and pressure but still with cough and postnasal drip which is pooling in the upper chest at night better when sitting upright no shortness of breath no fever Covid negative yesterday we will try Afrin nasal spray at night only for 3-4 nights continue Flonase we will also write for Tessalon Perles 3 times a day to help suppress the cough rest  time fluids if worsen does not improve she will call me but no need for antibiotics at this time no signs symptoms suggest pneumonia or bacterial sinusitis.  Thank you    Total time of care approximately 20 minutes

## 2020-04-17 NOTE — Telephone Encounter (Signed)
Called patient back who reports it that she has been having head colds with a sire throat that goes into her chest, Had COVID test on Saturday which was negative, patient still complaining of a head cold symptoms, sinus issues and a sore throat, would like to be seen.. Patient it to see her today @ 3:20 in respiratory vlicic

## 2020-04-17 NOTE — Telephone Encounter (Signed)
Significant laryngiitis.

## 2020-04-21 ENCOUNTER — Encounter: Payer: Self-pay | Admitting: Gastroenterology

## 2020-05-26 ENCOUNTER — Encounter: Payer: Self-pay | Admitting: Primary Care

## 2020-05-27 ENCOUNTER — Other Ambulatory Visit: Payer: Self-pay | Admitting: Orthopedic Surgery

## 2020-05-27 ENCOUNTER — Telehealth: Payer: Self-pay | Admitting: Orthopedic Surgery

## 2020-05-27 MED ORDER — AMOXICILLIN 500 MG PO CAPS *I*
ORAL_CAPSULE | ORAL | 0 refills | Status: AC
Start: 2020-05-27 — End: ?

## 2020-05-27 NOTE — Telephone Encounter (Signed)
Script for Amoxicillin sent electronically to patients pharmacy.

## 2020-05-27 NOTE — Telephone Encounter (Signed)
Patient called today and stated that she has a dental appointment tomorrow and needs a prophylaxis antibitoic sent to the [facility] in [place]. Please advise, thanks

## 2020-06-08 ENCOUNTER — Other Ambulatory Visit
Admission: RE | Admit: 2020-06-08 | Discharge: 2020-06-08 | Disposition: A | Discharge status: Home / Self Care | Payer: BLUE CROSS/BLUE SHIELD | Source: Ambulatory Visit | Attending: Primary Care | Admitting: Primary Care

## 2020-06-08 DIAGNOSIS — M1711 Unilateral primary osteoarthritis, right knee: Secondary | ICD-10-CM

## 2020-06-08 DIAGNOSIS — K219 Gastro-esophageal reflux disease without esophagitis: Secondary | ICD-10-CM

## 2020-06-08 DIAGNOSIS — G43909 Migraine, unspecified, not intractable, without status migrainosus: Secondary | ICD-10-CM

## 2020-06-08 DIAGNOSIS — F32A Depression, unspecified: Secondary | ICD-10-CM | POA: Insufficient documentation

## 2020-06-08 DIAGNOSIS — R3129 Other microscopic hematuria: Secondary | ICD-10-CM | POA: Insufficient documentation

## 2020-06-08 DIAGNOSIS — E663 Overweight: Secondary | ICD-10-CM | POA: Insufficient documentation

## 2020-06-08 DIAGNOSIS — I341 Nonrheumatic mitral (valve) prolapse: Secondary | ICD-10-CM

## 2020-06-08 DIAGNOSIS — K449 Diaphragmatic hernia without obstruction or gangrene: Secondary | ICD-10-CM | POA: Insufficient documentation

## 2020-06-08 DIAGNOSIS — Z0000 Encounter for general adult medical examination without abnormal findings: Secondary | ICD-10-CM

## 2020-06-08 DIAGNOSIS — G4730 Sleep apnea, unspecified: Secondary | ICD-10-CM | POA: Insufficient documentation

## 2020-06-08 DIAGNOSIS — R7309 Other abnormal glucose: Secondary | ICD-10-CM

## 2020-06-08 DIAGNOSIS — E559 Vitamin D deficiency, unspecified: Secondary | ICD-10-CM | POA: Insufficient documentation

## 2020-06-08 LAB — BASIC METABOLIC PANEL
Anion Gap: 13 (ref 7–16)
CO2: 25 mmol/L (ref 20–28)
Calcium: 9.6 mg/dL (ref 8.6–10.2)
Chloride: 103 mmol/L (ref 96–108)
Creatinine: 0.87 mg/dL (ref 0.51–0.95)
GFR,Black: 82 *
GFR,Caucasian: 71 *
Glucose: 100 mg/dL — ABNORMAL HIGH (ref 60–99)
Lab: 15 mg/dL (ref 6–20)
Potassium: 4.8 mmol/L (ref 3.3–5.1)
Sodium: 141 mmol/L (ref 133–145)

## 2020-06-08 LAB — URINALYSIS WITH MICROSCOPIC
Bacteria,UA: NONE SEEN
Blood,UA: NEGATIVE
Glucose,UA: NEGATIVE
Hyaline Casts,UA: NONE SEEN /lpf (ref 0–5)
Ketones, UA: NEGATIVE
Leuk Esterase,UA: NEGATIVE
Nitrite,UA: NEGATIVE
Specific Gravity,UA: 1.022 (ref 1.002–1.030)
pH,UA: 8 (ref 5.0–8.0)

## 2020-06-08 LAB — LIPID PANEL
Chol/HDL Ratio: 2.9
Cholesterol: 225 mg/dL — AB
HDL: 78 mg/dL — ABNORMAL HIGH (ref 40–60)
LDL Calculated: 133 mg/dL — AB
Non HDL Cholesterol: 147 mg/dL
Triglycerides: 70 mg/dL

## 2020-06-08 LAB — CBC AND DIFFERENTIAL
Baso # K/uL: 0 10*3/uL (ref 0.0–0.1)
Basophil %: 0.6 %
Eos # K/uL: 0.1 10*3/uL (ref 0.0–0.4)
Eosinophil %: 1.6 %
Hematocrit: 42 % (ref 34–45)
Hemoglobin: 12.8 g/dL (ref 11.2–15.7)
IMM Granulocytes #: 0 10*3/uL (ref 0.0–0.0)
IMM Granulocytes: 0.2 %
Lymph # K/uL: 1.6 10*3/uL (ref 1.2–3.7)
Lymphocyte %: 32 %
MCH: 27 pg (ref 26–32)
MCHC: 30 g/dL — ABNORMAL LOW (ref 32–36)
MCV: 88 fL (ref 79–95)
Mono # K/uL: 0.6 10*3/uL (ref 0.2–0.9)
Monocyte %: 11.7 %
Neut # K/uL: 2.7 10*3/uL (ref 1.6–6.1)
Nucl RBC # K/uL: 0 10*3/uL (ref 0.0–0.0)
Nucl RBC %: 0 /100 WBC (ref 0.0–0.2)
Platelets: 170 10*3/uL (ref 160–370)
RBC: 4.8 MIL/uL (ref 3.9–5.2)
RDW: 15.8 % — ABNORMAL HIGH (ref 11.7–14.4)
Seg Neut %: 53.9 %
WBC: 5 10*3/uL (ref 4.0–10.0)

## 2020-06-08 LAB — AST: AST: 22 U/L (ref 0–35)

## 2020-06-08 LAB — VITAMIN D: 25-OH Vit Total: 35 ng/mL (ref 30–60)

## 2020-06-08 LAB — ALT: ALT: 15 U/L (ref 0–35)

## 2020-06-09 LAB — HEMOGLOBIN A1C: Hemoglobin A1C: 5.8 % — ABNORMAL HIGH

## 2020-06-25 ENCOUNTER — Ambulatory Visit: Payer: BLUE CROSS/BLUE SHIELD | Admitting: Primary Care

## 2020-06-25 ENCOUNTER — Encounter: Payer: Self-pay | Admitting: Primary Care

## 2020-06-25 VITALS — BP 132/86 | HR 68 | Temp 96.9°F | Ht 65.0 in | Wt 235.8 lb

## 2020-06-25 DIAGNOSIS — R7309 Other abnormal glucose: Secondary | ICD-10-CM

## 2020-06-25 DIAGNOSIS — G43909 Migraine, unspecified, not intractable, without status migrainosus: Secondary | ICD-10-CM

## 2020-06-25 DIAGNOSIS — E663 Overweight: Secondary | ICD-10-CM

## 2020-06-25 DIAGNOSIS — G4730 Sleep apnea, unspecified: Secondary | ICD-10-CM

## 2020-06-25 DIAGNOSIS — R809 Proteinuria, unspecified: Secondary | ICD-10-CM

## 2020-06-25 DIAGNOSIS — M1711 Unilateral primary osteoarthritis, right knee: Secondary | ICD-10-CM

## 2020-06-25 DIAGNOSIS — K449 Diaphragmatic hernia without obstruction or gangrene: Secondary | ICD-10-CM

## 2020-06-25 DIAGNOSIS — F32A Depression, unspecified: Secondary | ICD-10-CM

## 2020-06-25 DIAGNOSIS — R3129 Other microscopic hematuria: Secondary | ICD-10-CM

## 2020-06-25 DIAGNOSIS — K219 Gastro-esophageal reflux disease without esophagitis: Secondary | ICD-10-CM

## 2020-06-25 DIAGNOSIS — Z0000 Encounter for general adult medical examination without abnormal findings: Secondary | ICD-10-CM

## 2020-06-25 DIAGNOSIS — E559 Vitamin D deficiency, unspecified: Secondary | ICD-10-CM

## 2020-06-25 DIAGNOSIS — I341 Nonrheumatic mitral (valve) prolapse: Secondary | ICD-10-CM

## 2020-06-25 NOTE — Progress Notes (Signed)
Chief complaint:Pleasant female seen here for physical as well as review of multiple issues we reviewed together family history past medical history medications allergies and immunizations.  Recent lab work reviewed together.         HPI: Pleasant female seen here for physical without any acute new issues she is feeling better mood wise and stress wise  since loss of her brother.  She declines dose change in medications.  She agrees with keeping the bupropion dose as is and not stepping down for now    Migraine headaches none of late does have a triptan as needed    Rhinitis has the fluticasone that she uses.    Heartburn controlled with famotidine and Carafate    Vitamin D deficiency level reasonable continue supplement    Overweight stable over the past year encouraged 10% weight loss with continued regular exercise decrease calorie intake    Left knee osteoarthritis status post replacement doing well happy with results    Mild protein in urine recommend repeat plan well-hydrated    Possible sleep apnea has been tested in the past husband states she does snore a lot and may stop breathing we will put in a referral.        Current Outpatient Medications   Medication Sig Dispense Refill    buPROPion (WELLBUTRIN XL) 300 MG 24 hr tablet Take 1 tablet (300 mg total) by mouth every morning  Swallow whole. Do not crush, break, or chew.--new strength 90 tablet 1    famotidine (PEPCID) 40 MG tablet Take 40 mg by mouth 2 times daily      fluticasone (FLONASE) 50 MCG/ACT nasal spray Spray 1 spray into nostril 2 times daily 48 g 3    sucralfate (CARAFATE) 1 GM tablet Take 1 g by mouth nightly       Calcium 500 MG tablet Take 1 tablet by mouth daily         Cholecalciferol (VITAMIN D3) 1000 UNIT tablet Take 1,000 Units by mouth every morning         amoxicillin (AMOXIL) 500 mg capsule Take 4 capsules 1 hour prior to dental procedure 20 capsule 0    ALPRAZolam (XANAX) 0.5 MG tablet Take 0.5-1 tablets (0.25-0.5 mg total)  by mouth nightly as needed for Anxiety  Max daily dose: 0.5 mg -Beware of sedation 30 tablet 0    acetaminophen (TYLENOL) 500 mg tablet Take 2 tablets (1,000 mg total) by mouth 3 times daily . For Post-op pain. 100 tablet 0    rizatriptan (MAXALT) 5 MG tablet Take 1 tablet (5 mg total) by mouth as needed for Migraine   May repeat in 2 hours if needed.  Max 6 tabs/day. 10 tablet 3     No current facility-administered medications for this visit.        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 08/26/2015    Tear of medial cartilage or meniscus of knee, current 12/03/2010    Depression 11/07/2010    Vitamin D deficiency 07/23/2010    Sleep apnea 02/22/2007     [facility]: Mar 15 2007  9:05PM - [place], [place]: dr [place] --not on cpap        Overweight 04/05/2003  Migraine headache 04/05/2003             Mitral valve prolapse 04/05/2003                 Past Medical History:   Diagnosis Date    Abdominal pain 07/19/2012    Anxiety 3/12    Benign neoplasm of colon     Calcaneal bursitis 01/14/2012    right    Chronic cholecystitis 10/2012    Chronic GERD 9/48/5462    Complication of anesthesia     PONV    Current use of proton pump inhibitor 06/04/2019    Depression     Diverticulosis 11/27/2013    mild    Hiatal hernia 12/12/2018    Internal hemorrhoids     Microhematuria 07/14/2019    Migraine     q2-3 months    Migraine headache 04/05/2003          Mitral valve prolapse     OA (osteoarthritis)     left knee    Osteoarthritis of right knee 04/28/2017    Osteoarthritis, knee 12/03/2010    PONV (postoperative nausea and vomiting)     Primary osteoarthritis of left knee 08/26/2015    Rosacea 04/05/2003          Ruptured cerebral aneurysm     age 27    Sleep apnea     does not  use CPAP    Syncope     d/t migraines    Urinary tract infection     frequent  3 this year    Vertigo     Vitamin D deficiency 07/23/2010        Past Surgical History:   Procedure Laterality Date    CESAREAN SECTION, CLASSIC  '86 '88    spinal    CHOLECYSTECTOMY, LAPAROSCOPIC  11/16/2012    COLONOSCOPY  2012    COLONOSCOPY  11/24/2013    10/2018    COLONOSCOPY  05/19/2019    repeat 04/2024    KNEE ARTHROSCOPY Left 06/19/11    strabismus correction Right 1972, 1993    x2--child + 75 yo    [place]    4 wisdom and B/L bicuspids        Family History   Problem Relation Age of Onset    Breast cancer Mother     Depression Mother     Stroke Father     Heart failure Father     Heart Disease Father     Breast cancer Sister     Heart Disease Brother     Asthma Sister     Ovarian cancer Maternal Grandmother     Colon cancer Maternal Grandfather     Depression Sister     Depression Brother     Depression Daughter     Stroke Paternal Grandmother     Stroke Paternal Grandfather     Substance abuse Sister         Drug - past    Substance abuse Brother         Alcohol - current       Review of systems:    Generally feeling:  Well  Feeling tired:  No  Fever:  No  Chills:  No  Recent weight change:  No  Headache:  No recent migraines  Vision problems:  No  Hearing loss:  No  Earache:  No  Ringing in the ears (tinnitus):  No  Nasal symptoms:  No  Sore throat:  No  Chest pain:  No  Palpitations:  No  Leg pain with exercise (claudication):  No  Shortness of breath:  No  Cough:  No   wheezing:  No  Change in appetite:  No  Heartburn:  No  Nausea:  No  Vomiting:  No  Abdominal pain:  Rare and mild if constipated  Change in the stool:  No  Diarrhea:  No  Constipation:  No  Blood in  urine:  No  Increased urinary frequency:  No  Pain during urination:  No  Excessive thirst or fluid intake:  No  Excessive sweating:  No  Easy bleeding:  No  Easy bruising tendency:  No  Back pain:  Improving right  low back pain  Generalized muscle aches:  No  Localized joint pain:  No  Dizziness: stable occas vertigo  Lightheadedness:  No  Fainting:  No  Memory loss:  No      Social history extended    Caffeine KDX:IPJA  Tobacco SNK:NLZJQ  Alcohol use:<1 a month  Drug BHA:LPFX  Sleep habits:better now  Sunscreen TKW:IOXB  Ophthalmology:regular  Dental hygiene:regular  Dentist:regular  Exercise habits:30-40 min daily cycling  Work history:fulltime  Marital history:married       Vitals:    06/25/20 0919   BP: 132/86   Pulse:    Temp:    Weight:    Height:      Body mass index is 39.24 kg/m.    Physical exam:    General appearance:    Well-developed, well groomed.  Appears stated age.  No acute distress.  Color good.  Mental status:  Appears alert and oriented.  Affect appropriate.  Skin:  Skin color and turgor normal.  No suspicious lesions, masses, rashes, or ulcerations.  Nails  and hair appear normal.  Head:  Normocephalic  Ears:  External ears without scars, masses, or lesions.  External auditory canal intact, clear, and without lesions.  TMs intact with normal light reflex and landmarks.  Acuity to conversational tones good.  Eyes:extraocular movements intact.  Lids without defect, conjunctiva and sclera appear normal.  Nose:  Nasal mucosa and turbinates pink, septum midline, no lesions.  Mouth:  Teeth in good repair.  Gums pink without lesions.  Normal appearing mucosa, palate, and tongue.  Oropharynx:  Moist without exudate, erythema, or swelling.  Neck:  Symmetric, trachea midline.  Thyroid nontender without enlargement or masses.  Carotids with no bruits bilaterally.  No cervical lymphadenopathy.  Breasts: Deferred by patient to her gynecologist   chest:  Respirations unlabored.  Chest was symmetric with no masses.  Breath sounds clear bilaterally without wheezes, rubs, rales, or rhonchi.  CV:  Normal S1 and S2 without murmur, rub, gallop, or click.  No edema, clubbing, or cyanosis.    Dorsalis pedis pulses full and  symmetrical.  Abdomen:  Abdomen soft with normal bowel sounds.  No guarding or rebound.  No palpable masses or tenderness.  Liver without tenderness or enlargement.  No aortic widening.  GU and rectal deferred to GYN  Musculoskeletal:  Muscle tone and strength normal for age, without atrophy or abnormal movements.  Extremities:  Joints with full range of motion, without tenderness, crepitus, or contracture.  No obvious joint deformities or effusions.  Neuro:  Cranial nerves II through XII grossly intact.  Motor strength symmetrical without obvious weakness.  Superficial sensation intact bilaterally to light touch.  Observed dexterity without ataxia or tremor.  Deep tendon reflexes 2+ and  symmetric bilaterally.         Assessment and plan:         Pleasant female seen here for physical and review of several issues.    Left knee replacement doing well happy with results    Heartburn continue GI recommendations with the Carafate and famotidine    Rhinitis continue Flonase twice daily    Migraine headaches rare has Maxalt if needed    Vitamin D deficiency continue vitamin D supplement    Mood lots of stressors with work and loss of brother doing better of late continue current dose of bupropion as is follow-up in 3 to 4 months    Overweight stable encouraged 10% weight loss over the next several months with exercise decrease calorie intake    Cholesterol 3.8% cardiovascular risk controlled on statin had a stress test within the year.    Possible sleep apnea we will set up sleep clinic referral for possible home testing      Counseling and education:      Healthcare proxy:has one per pt  Lab results:reviewed  Diet and bodyweight discussed  Aerobic exercise discussed  Alcohol use discussed  Recreational drug use discussed  Colon cancer screening discussed  Ob/Gyn care discussed--yrly and went in July  Osteoporosis prevention and screening discussed--8/21  Breast self-exam discussed  Mammogram discussed  Skin cancer  awareness and prevention discussed  Cardiac risk factor modification discussed  Motor vehicle safety discussed  Next H&P:99yr

## 2020-06-27 ENCOUNTER — Other Ambulatory Visit
Admission: RE | Admit: 2020-06-27 | Discharge: 2020-06-27 | Disposition: A | Discharge status: Home / Self Care | Payer: BLUE CROSS/BLUE SHIELD | Source: Ambulatory Visit | Attending: Primary Care | Admitting: Primary Care

## 2020-06-27 DIAGNOSIS — R809 Proteinuria, unspecified: Secondary | ICD-10-CM | POA: Insufficient documentation

## 2020-06-27 LAB — URINALYSIS WITH MICROSCOPIC
Bacteria,UA: NONE SEEN
Blood,UA: NEGATIVE
Glucose,UA: NEGATIVE
Hyaline Casts,UA: NONE SEEN /lpf (ref 0–5)
Ketones, UA: NEGATIVE
Nitrite,UA: NEGATIVE
Protein,UA: NEGATIVE
Specific Gravity,UA: 1.01 (ref 1.002–1.030)
pH,UA: 7 (ref 5.0–8.0)

## 2020-07-05 ENCOUNTER — Other Ambulatory Visit: Payer: Self-pay | Admitting: Primary Care

## 2020-08-07 ENCOUNTER — Other Ambulatory Visit: Payer: Self-pay | Admitting: Gastroenterology

## 2020-08-07 LAB — HM MAMMOGRAPHY

## 2020-08-08 ENCOUNTER — Encounter: Payer: Self-pay | Admitting: Primary Care

## 2020-09-06 ENCOUNTER — Telehealth: Payer: Self-pay

## 2020-09-06 NOTE — Telephone Encounter (Signed)
LM confirming NPV video on 09/10/20.

## 2020-09-10 ENCOUNTER — Encounter: Payer: Self-pay | Admitting: Sleep Medicine

## 2020-09-10 ENCOUNTER — Ambulatory Visit: Payer: No Typology Code available for payment source | Attending: Sleep Medicine | Admitting: Sleep Medicine

## 2020-09-10 DIAGNOSIS — G4733 Obstructive sleep apnea (adult) (pediatric): Secondary | ICD-10-CM | POA: Insufficient documentation

## 2020-09-10 DIAGNOSIS — F5104 Psychophysiologic insomnia: Secondary | ICD-10-CM | POA: Insufficient documentation

## 2020-09-10 NOTE — Patient Instructions (Signed)
[facility]:   Drowsy Driving Safety Facts and Tips    Drowsy driving is a common - and often deadly - danger on our roads. It occurs when you are too tired to remain alert while operating a motor vehicle. As a result you may struggle to stay focused on the road and fail to practice safe driving techniques.     Unfortunately, drowsy driving occurs all too often. The [place] National Highway Traffic Safety Administration (NHTSA) reports that drowsy driving causes more than 100,000 crashes a year. These accidents result in an estimated 40,000 injuries and 1,550 deaths.    Feeling drowsy makes you easily distracted. You may be unable to focus and pay attention. This can lead to increased errors and a decreased ability to detect and correct mistakes. Your thinking can be slowed and your response time delayed. People who are sleepy also tend to be unaware that their performance and alertness are suffering.     Drowsy driving can be as deadly as drunk driving, putting yourself, your passengers and other drivers in danger. Symptoms of fatigue include:  • Eyes closing or going out of focus   • Persistent yawning   • Irritability, restlessness, and impatience   • Wandering or disconnected thoughts   • Inability to remember driving the last few miles   • Drifting between lanes or onto shoulder   • Abnormal speed, tailgating, or failure to obey traffic signs   • Back tension, burning eyes, shallow breathing or inattentiveness     Accidents caused by drowsy driving tend to share some common features. These include:    Time of Day: Accidents often occur late at night or early in the morning during the body’s natural sleep period. Accidents also can occur when sleepiness peaks after lunch in the middle of the afternoon.    Speed: Accidents often occur at high speeds on highways and other major roadways.    Solitude: Accidents often involve one vehicle that veers off the road. Drivers often are alone in the  vehicle.    Driver Behavior - In many cases, drivers who are drowsy make no effort to brake or avoid an accident. Oftentimes, at least one vehicle may veer off the road.    Severity: Accidents often cause severe injuries or death.    Anyone who doesn’t get enough sleep can drive drowsy. But certain people have a higher risk than others, including people who: work night shifts or rotating shifts, drive a substantial number of miles each day, have a sleep disorder, drink alcohol, an those prescribed sedating medication.    Do you best to avoid drowsy driving. Rolling down the windows or turning up the volume on the radio will do little to increase your alertness while driving. These are some better ways to avoid drowsy driving:  • Maintain a regular sleep schedule that allows adequate rest, get a full night of seven to eight hours of sleep before driving  • Avoid driving late at night between 12am and 6am  • Avoid driving alone  • On a long trip, share the driving with another passenger  • When the signs of fatigue begin to show, get off the road. Take a short nap in a well-lit area. Do not simply stop on the side of the road.   • Use caffeine for a short-term boost  • Take a short nap after consuming caffeine to maximize the effect  • Arrange for someone to give you   a ride home after working a late shift    [facility]    What is a Home Sleep Study?  --------------------------------------------------------------------------------    · Overnight sleep studies are typically thought of as taking place in a hospital or sleep clinic laboratory setting. However, a few years ago, new technologies made it possible for sleep studies to be take place in patients' homes.    · The home sleep study, also known as a home sleep apnea test (HSAT), has since become a main part of the diagnostic process for identifying the presence of obstructive sleep apnea (OSA).     · About 75% of patients who have been referred for an  overnight, lab-based sleep study to test for Sleep Apnea, wind up good candidates for an HSAT.     · The HSAT does not test for all types of sleep disorders, but is a reliable and helpful tool in evaluating patients suspected of having sleep apnea.      How does a Home Sleep Apnea Test work?     The HSAT comes as a small kit with several parts and instructions for how to use them.  The kit will include:  · A small recording device, usually the size of a small cell phone that records information. The device also measure your body position and records the sounds of snoring.  · A nasal cannula (clear tubing inserted into the nostril to measure airflow)  · A thin, stretchy belt to wear across the chest that measures the  breathing effort and pattern.  · A pulse oximeter (a sensor that fits over the finger to measure oxygen level & pulse rate.     Each of these components is applied prior to bedtime; they usually require simple adhesion, but nothing is painful.    The recording device, once the test is started, collects a variety of data--how many pauses in breathing occurred, how low the oxygen levels dipped throughout the night, any changes in heart and respiratory rates.    In about 90% of patients a single night recording is enough to determine if a patient has obstructive sleep apnea. On occasion, if someone has a poor night of sleep or a technical issue with the HSAT device we will repeat the study on a second night.     What is the procedure for participating in an HST?    You will receive the test and instructions. At that short meeting, you (or your caregiver) will be shown how to apply the components of the test kit, which should take less than 10 minutes. You will also be given advice for how connect, disconnect, turn on and turn off the recorder. There will be an opportunity for you to get some hands-on practice.    On the day prior to the evening test, make sure you follow your regular routine, but avoid  napping and caffeine after lunch. You may also be asked to temporarily discontinue certain medications by your physician.     On the night of the test, you set up for the test as directed, then go to bed as usual. In the morning, you turn off the device, disconnect and remove the components and return the kit to the clinic (either in person or by prearranged delivery).    The data from the study will be collected, reviewed and scored by a sleep technologist, who will then turn this report over to your sleep physician, who will interpret the results.      Obstructive Sleep Apnea    Obstructive sleep apnea (OSA) is a very common condition affecting   4-8% of American adults.  It is caused by the back of the throat blocking off during sleep leading either to a complete stoppage of breathing or a reduction in airflow.  These breathing disturbances then lead to a poor quality sleep although the person with OSA may have no idea that these events are occuring.  Almost everyone with OSA snores, although just because a person snore does not mean that they have OSA.    Since OSA can lead to a poor quality sleep, one of the main symptoms is excessive sleepiness.  It can also negatively impact memory, concentration, and mood just like a lack of sleep can.  In addition to these effects on how a person feels, it may also affect your health.  It can contribute to high blood pressure, Diabetes, and high cholesterol. It  also increases risk for heart disease, irregular heart beat, and stroke.    One of the biggest dangers of OSA, or any condition that makes a person tired is drowsiness with driving.  A drowsy driver is impaired to the same degree as a person that is legally drunk.  People with sleep apnea may have a 2-4 fold increased risk for a car accident.  If you are driving and start feeling sleepy, you need to pull over and take a break (perhaps even drink a cafeinated beverage if necessary).  Measures like rolling down the  window or turning up the radio are not effective and will not help you to arrive at your destination safely.    There are a number of factors that can increase the risk for sleep apnea.  Obesity is a common factor, therefore if a person is overweight and has OSA then weight loss can sometimes be curative.  Other factors include family history, enlarged tonsils, nose and jaw structure, as well as alcohol and cigarette use.

## 2020-09-10 NOTE — Progress Notes (Signed)
[facility] Patient Visit       Referred to the [facility] by Dr. Faustino Congress for evaluation.    Patient's Location:  home  Other Attendees:  none  Provider:  Tera Mater, MD  Location of Telemedicine Provider:  Clinical office, [facility], [place], [place]    HISTORY       Dear Dr. Faustino Congress,    This afternoon, I evaluated [name] for evaluation of the complaint, "I've had problems with my sleep for several years. 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 She presents today with a 15 year history of prolonged nighttime awakenings and 7-8 year history of snoring, morning xerostomia, nocturia, and non-restorative sleep. She had a sleep study about 15 years ago at [facility] and was told she had mild sleep-disordered breathing, and was told her sleep issues were more related to anxiety and worrying about things. She has noticed that lately, her sleep as worsened.    Her sleep hours are typically from 9:30-10pm to 4-5am. She typically does not have trouble falling asleep, but may have difficulty falling asleep if she has a lot of work stress and may take an occasional alprazolam, which is helpful. She reports numerous awakenings (3-4 times) during the night due to nocturia or spontaneously. These are typically prolonged awakenings and she will often take 45-60 minutes to get back to sleep, and may not go back to sleep if she wakes up at 4am. She will stay in bed during this time and watch the clock. She will have difficulty shutting down her thoughts, especially related to work. [name] does not typically nap during the daytime as she does not have the time. She may take a 2-3  hour nap on the weekend, which is refreshing. However, this can interfere with nighttime sleep, so she tries to avoid these.    She snores moderately loud. Her husband has not witnessed her to have breathing pauses in her sleep. She endorses occasional nocturnal gasping/choking arousals and her husband has heard this. Her sleep is fragmented and non-restorative. She feels her sleep is more restorative when it is less fragmented. She only awakens feeling tired in the mornings only once per week. She endorses morning xerostomia and nasal congestion, but denies morning headaches (migraines improved after menopause), hot flashes/night sweats, and bruxism. She does not endorse symptoms consistent with restless leg syndrome, nor frequent nocturnal limb movements that awaken her from sleep. She rolls around frequently during the night. She typically sleeps side. If she ends up in the supine position, she has worsened snoring and gasping arousals.    She is not particularly sleepy during the day, but is sometimes tired during the daytime. Her Epworth Sleepiness Scale score is 6/24. She feels sleepy when sitting in the evenings after dinner or as a passenger in a car. She denies sleepiness while driving.     She does experience occasional difficulties with concentration and focus, not so much with memory. She would not describe herself as irritable during the daytime. She does experience symptoms of depression and anxiety. This worsened in 10-10-19 after the death of her mother in 10-Nov-2019 and her twin brother (  MI in his sleep, had several intracranial hemorrhages from falls during intoxication) in August 2021. She had a lot of difficulty sleeping and nightmares. She had recurrent nightmares of trying to save her twin brother from fires, drowning, etc. after he passed away. She had tried alprazolam to help with anxiety after this and for the nightmares. She has not seen a counselor and thinks she may at some point but is  having a hard time finding the times especially given her work stress. She did not end up taking off work after her brother's [employer] as a co-worker had already gone out on short-term disability.    Weight changes in recent years: stable     Recent echocardiogram/cardiac testing: dobutamine stress echo 10/30/2019 LVEF 67%, estimated PA systolic pressure is mildly elevated    She went through menopause in 2013.    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    Osteoarthritis of right knee M17.11    Hiatal hernia K44.9    Chronic GERD K21.9    Current use of proton pump inhibitor Z79.899    Microhematuria R31.29     PAST MEDICAL HISTORY     Past Medical History:   Diagnosis Date    Abdominal pain 07/19/2012    Anxiety 3/12    Benign neoplasm of colon     Calcaneal bursitis 01/14/2012    right    Chronic cholecystitis 10/2012    Chronic GERD 1/91/4782    Complication of anesthesia     PONV    Current use of proton pump inhibitor 06/04/2019    Depression     Diverticulosis 11/27/2013    mild    Hiatal hernia 12/12/2018    Internal hemorrhoids     Microhematuria 07/14/2019    Migraine     q2-3 months    Migraine headache 04/05/2003          Mitral valve prolapse     OA (osteoarthritis)     left knee    Osteoarthritis of right knee 04/28/2017    Osteoarthritis, knee 12/03/2010    PONV (postoperative nausea and vomiting)     Primary osteoarthritis of left knee 08/26/2015    Rosacea 04/05/2003          Ruptured cerebral aneurysm     age 65    Sleep apnea     does not use CPAP    Syncope     d/t migraines    Urinary tract infection     frequent  3 this year    Vertigo     Vitamin D deficiency 07/23/2010     Past Surgical  History:   Procedure Laterality Date    CESAREAN SECTION, CLASSIC  '86 '88    spinal    CHOLECYSTECTOMY, LAPAROSCOPIC  11/16/2012    COLONOSCOPY  2012    COLONOSCOPY  11/24/2013    10/2018    COLONOSCOPY  05/19/2019    repeat 04/2024    KNEE ARTHROSCOPY Left 06/19/11    strabismus correction Right 1972, 1993    x2--child + 73 yo    [place]  1978    4 wisdom and B/L bicuspids       FAMILY HISTORY    There is no known family history of sleep disorders.    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     caffeine intake: rare (stopped due to reflux), may have half of a cup of coffee in the afternoon if she has a meeting that she needs to stay alert for; she used to drink 3-4 cups of coffee per day for taste/alertness and to help her focus   smoking status: never smoker   alcohol: 1 drinks/month - falls asleep easily but may fragment sleep more   recreational drugs: none   exercise: stationary bike    ALLERGIES      Allergies   Allergen Reactions    Codeine Nausea And Vomiting    Gabapentin Other (See Comments)     Loopy      Lexapro [Escitalopram] Other (See Comments)     Fatigue    Wellbutrin [Bupropion] Other (See Comments)     Hair loss     MEDICATIONS       Current Outpatient Medications   Medication Sig Note    buPROPion (WELLBUTRIN XL) 300 mg 24 hr tablet TAKE 1 TABLET BY MOUTH EVERY DAY SWALLOW WHOLE DONT CRUSH BREAK OR CHEW NEW STRENGTH     famotidine (PEPCID) 40 MG tablet Take 40 mg by mouth 2 times daily     sucralfate (CARAFATE) 1 GM tablet Take 1 g by mouth nightly      Calcium 500 MG tablet Take 1 tablet by mouth daily        Cholecalciferol (VITAMIN D3) 1000 UNIT tablet Take 1,000 Units by mouth every morning    06/25/2020: Taking 2,000 units    amoxicillin (AMOXIL) 500 mg capsule Take 4 capsules 1 hour prior to dental procedure     ALPRAZolam (XANAX) 0.5 MG  tablet Take 0.5-1 tablets (0.25-0.5 mg total) by mouth nightly as needed for Anxiety  Max daily dose: 0.5 mg -Beware of sedation     fluticasone (FLONASE) 50 MCG/ACT nasal spray Spray 1 spray into nostril 2 times daily     rizatriptan (MAXALT) 5 MG tablet Take 1 tablet (5 mg total) by mouth as needed for Migraine   May repeat in 2 hours if needed.  Max 6 tabs/day.      No current facility-administered medications for this visit.       VITALS (SELF-REPORT)   Height: 5 ft 5 inches  Weight: 235 lb  BMI: 391 kg/m2    PHYSICAL EXAMINATION (VIDEO)    General: obese, comfortable appearing, and in no apparent distress  Sclera: anicteric and conjunctiva pink   Nasal passages: slight left alar collapse on inspiration, appears patent  Oropharynx: low set soft palate, macroglossia with scalloping along the lateral margins of the tongue, large uvula, modified Mallampati class III airway  Maxilla and mandible: micrognathia (had bicuspids pulled when younger due to significant dental crowding) with mild retrognathia with overjet bite  Psychiatric: affect normal  Neurologic: alert and attentive, speech is fluent, eye movements are full, face is symmetric    LABORATORY DATA   06/08/2020: CO2 25, HbA1c 5.8%, cholesterol 225, triglycerides 70, HDL 78, LDL 133    ASSESSMENT      Possible Obstructive Sleep Apnea:   Possible Chronic Insomnia:    Victoria Butcher  [name] is a 65 y.o. post-menopausal woman with a history of obesity (BMI 39), GERD, migraine, ruptured cerebral aneurysm (age 42), anxiety, and depression, who presents today for evaluation of symptoms of prolonged nighttime awakenings, nocturia, moderate snoring, nocturnal gasping arousals, morning xerostomia, nasal congestion, daytime tiredness, concentration difficulties, and poor mood. Her examination is notable for upper airway crowding. Her past sleep testing showed mild sleep-disordered breathing, worse during REM sleep. Since that time, she has gone through menopause  and her symptoms have worsened. Her history and physical examination findings raise concern for a diagnosis of obstructive sleep apnea (OSA). Her prolonged awakenings may be due to underlying obstructive sleep apnea or could be due to chronic insomnia.    PLAN     1. We discussed sleep apnea, its causes, consequences, and treatments.  2. I have ordered a home sleep apnea test to evaluate for the possibility of obstructive sleep apnea. If this is inconclusive, we will pursue an in-lab diagnostic nocturnal polysomnogram.  3. We discussed insomnia, its causes, consequences, and treatments. I recommended she get out of bed if she is unable to fall asleep after 20-25 minutes and only return to bed when tired. Pending the above, we will consider cognitive behavioral therapy for insomnia (CBT-I) for her prolonged awakenings.  4. I encouraged efforts at weight loss. I also encouraged her practice of never driving while sleepy.  5. I will see her following her sleep study.    Thank you for allowing me to participate in the care of your patient.  Please do not hesitate to contact my office with any questions.    Sincerely,  Tera Mater, MD  [place]      Consent was previously obtained from the patient to complete this video telemedicine consult; including the potential for financial liability. The patient acknowledged and demonstrated understanding of this impression and these recommendations.    I personally spent 48 minutes on the calendar day of the encounter, including pre and post visit work.

## 2020-10-29 NOTE — Progress Notes (Signed)
[facility] STUDY POST TEST QUESTIONNAIRE      Patient Name:        [name]    MRN #  [id number]      1. What was your approximate bedtime? _________________________      2.  How long did it take you to fall asleep? _________________________      3. How many times did you wake up? _________________________      a. Approximately what time? / How long were you awake?    _____________/_______________    _____________/_______________    _____________/_______________    _____________/_______________    4. At what time did you get up for the day? __________________________    5. Any problems or comments?  __________________________               Patient signature:______________________   Date:_______________            Please Complete this page  &  Return with Sleep Study Case.Marland KitchenMarland KitchenThank You!                                                                                               [facility] SLEEP TESTING EQUIPMENT LOAN AGREEMENT  Please help [place] care for your family, friends, and neighbors by returning the equipment on-time.  If you don't return the equipment by the date stated below, you may delay another patient's diagnosis and treatment.      PATIENT NAME:  [name]    MRN#:   [id number]  PHONE NUMBER: [phone]  I have received the Home Sleep Monitor (Equipment) identified below from the [facility]) and have been instructed on how to use it. I agree that if I have questions on use of the equipment to call  [facility] at [phone]; iN AN EMERGENCY I WILL CALL 911.   Serial Number:    [id number]  I agree to return the equipment to [facility], in the case provided, by________________________, between 6:00 AM and 4:30 PM, TO the SLEEP CENTER'S FRONT DESK, via curbside service directly to a staff member or in hsat drop-box at [place], [place] [address].  A. I agree to  return the Equipment promptly and in the same condition that I borrowed it by the date stated above. I understand the Equipment is and at all times shall remain [facility]'s property. I acknowledge the cost to replace the Equipment is at least $3,000. I agree to be responsible for the cost of repairing or replacing the Equipment, if it is damaged, lost, confiscated, or stolen, until it is returned to [facility].    B. I understand and agree that if I fail to return the Equipment, including because it is lost, stolen, or confiscated, by the date stated above, or if the Equipment is damaged when returned, that [facility] may: i) bill me for the cost of the equipment, including by adding the cost to any outstanding balance; ii) sue me to  recover the cost of replacing or repairing the Equipment; and iii) take steps to terminate my physician-patient relationship with the Sleep Center. I agree to pay the [facility] costs, including reasonable attorneys' fees, for collecting the cost of repairing or replacing the Equipment.   IF YOU CANNOT RETURN THE EQUIPMENT BY THE DATE STATED ABOVE OR IF THE EQUPMENT IS lost, stolen or damaged, you must immediately contact us AT [phone].                             READ THIS FIRST    Home Sleep Study Guide     For Home Sleep Study set-up instructions watch the following video: Click the link sent via a MyChart message [email or web address]  or search the Internet for [employer] video.  Refer to the provided instructions as needed.     To initiate recording simply place the blue belt around your chest making sure both ends of the belt are fully connected to the device.  This belt action automatically starts and stops the apnea screening device. The On/Off quadrant light spins for 30 seconds.  The 3 sensor lights flash yellow until they recognize a good signal changing to solid green.  Once all 3 turn green the On/Off light also turns solid green for 1 minute, then  they'll shut off for the night one by one.  The device does not need to be on your skin, can go over top of nightwear.     We suggest that you adequately tape the cannula and oximeter, and sleep in whatever position you prefer.  If you get up during the night you can leave the equipment on, feel free to remove the finger probe briefly while up, just put back on a finger when returning to bed.       In the morning, unplug one side of the belt to stop the study and discard nasal cannula before placing equipment back in the box.  All other equipment can remain attached to the unit.       Return the unit to the lab the next business day between preferred hours of 6:00 am and 4:30 pm.      To report device drop-off please call [phone] upon arrival.  You can place case in the Home Study Drop-Box outside our main entrance Or we can retrieve the device from your vehicle.  Staff is available for drop-off until 4:30 pm or please utilize the 24/7 drop-box as needed.     If unable to return the device timely please call us @ [phone].     Results will be communicated via telemedicine or My Chart.      If you or someone in your home develop COVID-19 symptoms within 24 hours of the study please call the lab at [phone].                                    [facility] SLEEP TESTING EQUIPMENT LOAN AGREEMENT  Please help [place] care for your family, friends, and neighbors by returning the equipment on-time.  If you don't return the equipment by the date stated below, you may delay another patient's diagnosis and treatment.      PATIENT NAME:  [name]     MRN#:   [id number]   PHONE NUMBER: [phone]    I  have received the Home Sleep Monitor (Equipment) identified below from the [facility]) and have been instructed on how to use it. I agree that if I have questions on use of the equipment to call [facility] at [phone]; iN AN EMERGENCY  I WILL CALL 911.   Serial Number:   [id number]  I agree to return the equipment to [facility] sleep [facility], in the case provided, by________________________, between 6:00 AM and 4:30 PM, TO the SLEEP CENTER'S FRONT DESK, via curbside service directly to a staff member or in hsat drop-box at [place], [place] [address].  A. I agree to return the Equipment promptly and in the same condition that I borrowed it by the date stated above. I understand the Equipment is and at all times shall remain [place]'s property. I acknowledge the cost to replace the Equipment is at least $3,000. I agree to be responsible for the cost of repairing or replacing the Equipment, if it is damaged, lost, confiscated, or stolen, until it is returned to [facility].    B. I understand and agree that if I fail to return the Equipment, including because it is lost, stolen, or confiscated, by the date stated above, or if the Equipment is damaged when returned, that [facility] may: i) bill me for the cost of the equipment, including by adding the cost to any outstanding balance; ii) sue me to recover the cost of replacing or repairing the Equipment; and iii) take steps to terminate my physician-patient relationship with the Sleep Center. I agree to pay the [facility] costs, including reasonable attorneys' fees, for collecting the cost of repairing or replacing the Equipment.   IF YOU CANNOT RETURN THE EQUIPMENT BY THE DATE STATED ABOVE OR IF THE EQUPMENT IS lost, stolen or damaged, you must immediately contact us AT [phone].    Signed:  ____________________________  Date: _________      Name: [name]

## 2020-10-30 ENCOUNTER — Ambulatory Visit: Payer: No Typology Code available for payment source | Attending: Sleep Medicine | Admitting: Sleep Medicine

## 2020-10-30 DIAGNOSIS — G4733 Obstructive sleep apnea (adult) (pediatric): Secondary | ICD-10-CM | POA: Insufficient documentation

## 2020-10-30 NOTE — Progress Notes (Signed)
ARRIVED FOR HSAT PICK UP    Sleep MD:  [facility]    Interpreter Used: No    Estimated return date:  10/31/20     Fransisca Connors on 10/30/2020 at 1:06 PM

## 2020-10-31 NOTE — Progress Notes (Signed)
[facility]   SLEEP CENTER         [facility] HSAT PATIENT QUESTIONNAIRE    Patient Sleep Diary Entries    1)   What was your approximate bedtime? 9:45 p.m.    2)   How long did it take you to fall asleep? 1 HOUR   3)  How many times do you remember waking up? 2     A) Approx. Time ? / How long were you awake?      2:50AM/  15 minutes    4:15/  10 minutes    4) What time did you finally wake up for the day?  5:45 a.m.    5)  Any problems or Comments?    IT TOOK AN OUR TO FALL ASLEEP BECAUSE THE LIGHTS KEPT FLASHING.  I HAD TO TAPE PULSE OXIMETER TO MY FINGER AND THE NASAL TUBE CLOSER TO MY NOSE TO KEEP IT FROM SHIFTING      [name]

## 2020-10-31 NOTE — Progress Notes (Signed)
RETURNED HSAT DEVICE    Data quality: adequate, analysis in process    Follow up date: 11/13/2020     [name] [name] on 10/31/2020 at 11:17 AM

## 2020-11-02 NOTE — Procedures (Addendum)
This is a report for a nocturnal, unattended home sleep apnea test. The quality of the study was good.    This study utilized a 6-channel Type III home sleep respiratory recorder. The following parameters were monitored for a duration of 390.0 minutes: snoring (high frequency vibrations in airflow), oronasal pressure airflow, RIP chest effort, SpO2, pulse rate, and body position. There was no EEG monitoring during this study, as such we were not able to discern between periods of sleep and wake. As such, home sleep tests tend to underestimate the actual severity of sleep disordered breathing.    This study is consistent with a diagnosis of severe obstructive sleep apnea (unattended AHI 34.0, oxygen saturation nadir 78%). There was cyclic variation of heart rate with respiratory disturbances.    This study was electronically signed by Tera Mater, MD on 11/05/2020 at 4:47 PM

## 2020-11-13 ENCOUNTER — Telehealth: Payer: Self-pay

## 2020-11-13 ENCOUNTER — Ambulatory Visit: Payer: No Typology Code available for payment source | Attending: Sleep Medicine | Admitting: Sleep Medicine

## 2020-11-13 DIAGNOSIS — G4733 Obstructive sleep apnea (adult) (pediatric): Secondary | ICD-10-CM | POA: Insufficient documentation

## 2020-11-13 MED ORDER — CPAP MACHINE *A*
0 refills | Status: AC
Start: 2020-11-13 — End: ?

## 2020-11-13 NOTE — Progress Notes (Signed)
[place] Visit Sleep Study Follow Up Visit    Patient's Location:  home  Other Attendees:  None   Provider:  Roland Earl, NP  Location of Telemedicine Provider:  Home office    [name] is a 65 y.o. lady with PMHx including OSA, migraine, BMI 39, vitamin D deficiency, OA, GERD, who presents today for follow up of recent sleep testing.     Presenting Symptoms:    She originally presented to our clinic September 10, 2020 for evaluation of obstructive sleep apnea (OSA), based on presentation of symptoms. She was originally evaluated about 15 years ago, undergoing a sleep study at that time but no OSA was found.     Sleep Study Findings:    [name] completed a home sleep test on Oct 30, 2020. The study was consistent with a diagnosis of severe obstructive sleep apnea (unattended AHI 34.0, oxygen saturation nadir 78%). There was cyclic variation of heart rate with respiratory disturbances.    These findings may under estimate the true severity of sleep disordered breathing due to the limitations of home sleep testing.  The study quality was good.    Interval Symptoms:    [name] would like to try CPAP. Her husband has used one for years, and apparently is doing quite well.     QUESTIONNAIRE:     Recent Review Flowsheet Data     Epworth Sleep Scale 11/09/2020    Sitting and reading 2    Watching TV 1    Sitting inactive in a public place (e.g a theater or a meeting) 0    As a passenger in a car for an hour without a break 1    Lying down to rest in the afternoon when circumstances permit 2    Sitting and talking to someone 0    Sitting quietly after a lunch without alcohol 1    In a car, while stopped for a few minutes in traffic 0    EPWORTH TOTAL  7        MEDICATIONS:     Current Outpatient Medications   Medication Sig Note    buPROPion (WELLBUTRIN XL) 300 mg 24 hr tablet TAKE 1 TABLET BY MOUTH EVERY DAY SWALLOW WHOLE DONT CRUSH BREAK OR CHEW NEW STRENGTH     amoxicillin (AMOXIL) 500 mg  capsule Take 4 capsules 1 hour prior to dental procedure     ALPRAZolam (XANAX) 0.5 MG tablet Take 0.5-1 tablets (0.25-0.5 mg total) by mouth nightly as needed for Anxiety  Max daily dose: 0.5 mg -Beware of sedation     famotidine (PEPCID) 40 MG tablet Take 40 mg by mouth 2 times daily     fluticasone (FLONASE) 50 MCG/ACT nasal spray Spray 1 spray into nostril 2 times daily     sucralfate (CARAFATE) 1 GM tablet Take 1 g by mouth nightly      rizatriptan (MAXALT) 5 MG tablet Take 1 tablet (5 mg total) by mouth as needed for Migraine   May repeat in 2 hours if needed.  Max 6 tabs/day.     Calcium 500 MG tablet Take 1 tablet by mouth daily        Cholecalciferol (VITAMIN D3) 1000 UNIT tablet Take 1,000 Units by mouth every morning    06/25/2020: Taking 2,000 units     No current facility-administered medications for this visit.     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 Please see the EHR for full details.    IMPRESSION & PLAN:     1. Severe obstructive sleep apnea (ahi 34)     I reviewed the study findings and pathophysiology of obstructive sleep apnea and its association with cardiovascular disease with [name]. Treatment options including weight loss and CPAPwere discussed in detail. I feel that treatment is indicated both to improve the quality of her sleep as well as to lessen potential cardiovascular morbidity.    CPAP is the first line treatment of choice in combination  with weight loss and she is agreeable to this. I am writing a prescription for an auto titration CPAP device at 5-18 cmH2O for home usage to be sent to her chosen DME supplier.      I encouraged [name] to use her device anytime she is sleeping for the most clinical benefit. I urged her to contact us or her DME should she experience any barriers acclimating to CPAP therapy.    Ms. Cottone will return in 3 months to verify efficacy of and compliance with CPAP. I reviewed insurance compliance requirements for coverage. She was encouraged to contact our office should she have any questions or concerns.     Roland Earl, NP  [place]      An in-person visit with the patient was not advisable at this time due to COVID-19 concerns. In my professional opinion, my resulting orders would not have differed if an in-person consultation had been performed.    Consent was previously obtained from the patient to complete this telehealth consult; including the potential for financial liability. The plan was discussed with the patient and the patient/patient rep demonstrated understanding to the provider's satisfaction.          I personally spent 22 minutes on the calendar day of the encounter, including pre and post visit work.

## 2020-11-13 NOTE — Telephone Encounter (Signed)
Script for Airsense 10 or 11 Auto@ 5-18 cm and MRN have been faxed to [facility]

## 2020-12-06 ENCOUNTER — Ambulatory Visit: Payer: No Typology Code available for payment source | Admitting: Primary Care

## 2020-12-06 ENCOUNTER — Encounter: Payer: Self-pay | Admitting: Primary Care

## 2020-12-06 VITALS — BP 124/80 | HR 84 | Wt 234.0 lb

## 2020-12-06 DIAGNOSIS — F419 Anxiety disorder, unspecified: Secondary | ICD-10-CM

## 2020-12-06 DIAGNOSIS — G4730 Sleep apnea, unspecified: Secondary | ICD-10-CM

## 2020-12-06 DIAGNOSIS — G43909 Migraine, unspecified, not intractable, without status migrainosus: Secondary | ICD-10-CM

## 2020-12-06 DIAGNOSIS — K219 Gastro-esophageal reflux disease without esophagitis: Secondary | ICD-10-CM

## 2020-12-06 DIAGNOSIS — E663 Overweight: Secondary | ICD-10-CM

## 2020-12-06 DIAGNOSIS — F32A Depression, unspecified: Secondary | ICD-10-CM

## 2020-12-06 MED ORDER — ALPRAZOLAM 0.5 MG PO TABS *I*
0.2500 mg | ORAL_TABLET | Freq: Every evening | ORAL | 0 refills | Status: AC | PRN
Start: 2020-12-06 — End: ?

## 2020-12-06 MED ORDER — FROVATRIPTAN SUCCINATE 2.5 MG PO TABS *A*
2.5000 mg | ORAL_TABLET | ORAL | 3 refills | Status: AC | PRN
Start: 2020-12-06 — End: ?

## 2020-12-06 NOTE — Progress Notes (Signed)
CCf/u      HPI  Sleep apnea--planning to try a different appliance    Wt--encouraged regular exercise and decreased calorie intake, reviewed she could consider doing the Opdivo diet as well.    Mood--doing okay but would like more xanax--last filled in Oct--uses it in the evening  feels jittery at night.  I did review if she takes it every night she can get some dependency with it but the last prescription filled was over 6 months ago.  We will renew more.    gerd--controlled okay, does drink 1 cup a day    Migraines--good overall but when she gets them it often mildly last for couple days despite the triptan we will try Frova she usually does not take the second dose of the rizatriptan that she has.      Review of systems    Feeling fine  No fever  No headache  No chest pain  No shortness of breath  No cough  No change of appetite  No nausea  No vomiting  No abdominal pain  No stool changes  No urinary symptoms        Current Outpatient Medications   Medication Sig Dispense Refill    CPAP machine ([facility] [phone]) Dispense Auto CPAP 5-18 cmH2O, heated humidifier, climate line tubing & all needed supplies. Duration: Lifetime 1 each 0    buPROPion (WELLBUTRIN XL) 300 mg 24 hr tablet TAKE 1 TABLET BY MOUTH EVERY DAY SWALLOW WHOLE DONT CRUSH BREAK OR CHEW NEW STRENGTH 90 tablet 1    famotidine (PEPCID) 40 MG tablet Take 40 mg by mouth 2 times daily      fluticasone (FLONASE) 50 MCG/ACT nasal spray Spray 1 spray into nostril 2 times daily 48 g 3    sucralfate (CARAFATE) 1 GM tablet Take 1 g by mouth nightly       Calcium 500 MG tablet Take 1 tablet by mouth daily         Cholecalciferol (VITAMIN D3) 1000 UNIT tablet Take 1,000 Units by mouth every morning         amoxicillin (AMOXIL) 500 mg capsule Take 4 capsules 1 hour prior to dental procedure 20 capsule 0    ALPRAZolam (XANAX) 0.5 MG tablet Take 0.5-1 tablets (0.25-0.5 mg total) by mouth nightly as needed for Anxiety  Max daily dose: 0.5 mg -Beware of sedation  30 tablet 0    rizatriptan (MAXALT) 5 MG tablet Take 1 tablet (5 mg total) by mouth as needed for Migraine   May repeat in 2 hours if needed.  Max 6 tabs/day. 10 tablet 3     No current facility-administered medications for this visit.         Vitals:    12/06/20 0712   BP: 124/80   Pulse: 84   Weight: 106.1 kg (234 lb)         EXAM  Gen: patient appears comfortable and in no distress  Cv: RRR, Nl S1,S2  Resp: CTA b/l, no rales, no rhonchi  Ext: no edema          A/P Pleasant female seen here for several issues    Anxiety and depression mood wise she is doing relatively okay but some anxiety and lots of stress at work they will be moving out [place] to the [place] soon.  We will continue to refill medications I reviewed with her.  I will renew her alprazolam.  Mood wise not yet ready to decrease the dose of  bupropion she will continue the same dose    Migraines he will try Frova and see if that lasts longer does a better job for her    GERD continue famotidine and Carafate as per GI    Sleep apnea continue CPAP she will be following up with the sleep clinic and I recommend she ask about inspire should she not be able to tolerate CPAP.    Overweight generally stable encouraged decrease calorie intake regular exercise weight watchers up [place] etc.

## 2020-12-09 NOTE — Telephone Encounter (Signed)
Received communication from [place] that patient has been set up with equipment.    Set-up date: 11/23/20   Device: A11   Pressure settings: 5-18 cmH2O   Mask: Airfit F30i med mask   PAP follow up needed: yes   Appointment scheduled: No appt/  Sent my chart to schedule   On AirView: yes     Eula Listen

## 2020-12-26 ENCOUNTER — Other Ambulatory Visit: Payer: Self-pay | Admitting: Primary Care

## 2020-12-26 MED ORDER — BUPROPION HCL 300 MG PO TB24 *I*
300.0000 mg | ORAL_TABLET | Freq: Every morning | ORAL | 1 refills | Status: DC
Start: 2020-12-26 — End: 2021-06-09

## 2021-01-02 ENCOUNTER — Ambulatory Visit: Payer: No Typology Code available for payment source | Admitting: Sleep Medicine

## 2021-01-08 ENCOUNTER — Ambulatory Visit: Payer: No Typology Code available for payment source | Attending: Sleep Medicine | Admitting: Sleep Medicine

## 2021-01-08 DIAGNOSIS — G4700 Insomnia, unspecified: Secondary | ICD-10-CM | POA: Insufficient documentation

## 2021-01-08 DIAGNOSIS — G4733 Obstructive sleep apnea (adult) (pediatric): Secondary | ICD-10-CM | POA: Insufficient documentation

## 2021-01-08 NOTE — Progress Notes (Signed)
[name] CPAP Follow Up Visit    Patient's Location:  home  Other Attendees:  none  Provider:  Roland Earl, NP  Location of Telemedicine Provider:  Home office      [name] is a 65 y.o. lady with PMHx including OSA, migraine, depression, vitamin D deficiency, GERD, BMI 39, who presents today in follow up of CPAP therapy for the treatment of obstructive sleep apnea (OSA).    Presenting Symptoms/Sleep Testing:    [name] was initially seen at our office September 10, 2020 for evaluation of presence of obstructive sleep apnea (OSA)..  She completed a home sleep test on Oct 30, 2020 which demonstrated severe OSA (uAHI 34) and was prescribed auto CPAP.      CPAP Therapy/Interval Symptoms:    Since initiating treatment with CPAP, [name] reports resolution of snoring/breathing interruptions and improvement in sleep quality. She denies air hunger and pressure discomfort. She tells me she switched from a FFM to a nasal mask and notes improvement in comfort. She states she falls asleep easily but has difficulty maintaining sleep. She relates this to her stressful job, and being up at night worrying about deadlines.     PAP INTERROGATION:         QUESTIONNAIRE:     Recent Review Flowsheet Data     Epworth Sleep Scale 01/07/2021    Sitting and reading 1    Watching TV 1    Sitting inactive in a public place (e.g a theater or a meeting) 0    As a passenger in a car for an hour without a break 1    Lying down to rest in the afternoon when circumstances permit 1    Sitting and talking to someone 0    Sitting quietly after a lunch without alcohol 1    In a car, while stopped for a few minutes in traffic 0    EPWORTH TOTAL  5        MEDICATIONS:     Current Outpatient Medications   Medication Sig Note    buPROPion (WELLBUTRIN XL) 300 mg 24 hr tablet Take 1 tablet (300 mg total) by mouth every morning  TAKE 1 TABLET BY MOUTH EVERY DAY SWALLOW WHOLE DONT CRUSH BREAK OR CHEW     ALPRAZolam  (XANAX) 0.5 mg tablet Take 0.5-1 tablets (0.25-0.5 mg total) by mouth nightly as needed for Anxiety  Max daily dose: 0.5 mg -Beware of sedation     frovatriptan (FROVA) 2.5 MG tablet Take 1 tablet (2.5 mg total) by mouth as needed for Migraine  If recurs, may repeat after 2 hours. Max of 3 tabs in 24 hours.     CPAP machine ([facility] [phone]) Dispense Auto CPAP 5-18 cmH2O, heated humidifier, climate line tubing & all needed supplies. Duration: Lifetime     amoxicillin (AMOXIL) 500 mg capsule Take 4 capsules 1 hour prior to dental procedure     famotidine (PEPCID) 40 MG tablet Take 40 mg by mouth 2 times daily     fluticasone (FLONASE) 50 MCG/ACT nasal spray Spray 1 spray into nostril 2 times daily     sucralfate (CARAFATE) 1 GM tablet Take 1 g by mouth nightly      rizatriptan (MAXALT) 5 MG tablet Take 1 tablet (5 mg total) by mouth as needed for Migraine   May repeat in 2 hours if needed.  Max 6 tabs/day.     Calcium 500 MG tablet Take 1 tablet  by mouth daily        Cholecalciferol (VITAMIN D3) 1000 UNIT tablet Take 1,000 Units by mouth every morning    06/25/2020: Taking 2,000 units     No current facility-administered medications for this visit.     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 Please see the EHR for full details.    IMPRESSION & PLAN:     1. Severe Obstructive Sleep Apnea (uAHI 34):    Interrogation of [name]'s CPAP unit reveals good compliance with minimal air  leaks and effective treatment of sleep disordered breathing. She reports clinical benefit since initiating CPAP therapy.    I applauded her adherence to CPAP and encouraged continued compliance with therapy. We reviewed proper care and maintenance of her CPAP device and interface. I urged [name] to contact her DME supplier or our office if she experiences any barriers to continued CPAP use.     At our follow up visit, we may consider cognitive behavioral therapy for insomnia (CBTi)  If maintenance insomnia issues linger. We discussed strategies today to help her go back to sleep if she does awaken overnight, including guided imagery, or repeating her bedtime routine.     [name] will return in 6 months for CPAP follow up visit. She was instructed to contact our office with any questions or concerns.     Roland Earl, NP  [place]    An in-person visit with the patient was not advisable at this time due to COVID-19 concerns. In my professional opinion, my resulting orders would not have differed if an in-person consultation had been performed.    Consent was previously obtained from the patient to complete this telehealth consult; including the potential for financial liability. The plan was discussed with the patient and the patient/patient rep demonstrated understanding to the provider's satisfaction.        I personally spent 25 minutes on the calendar day of the encounter, including pre and post visit work.

## 2021-01-29 IMAGING — CR DX Sacrum and Coccyx
2 series · 3 of 3 positions shown · non-contrast
Comparison: None available.

Sacrum and coccyx 3 views
HISTORY: Pain in sacrococcygeal area.

[Series 1: ap axial sacrum tabl · 0.17mm/px · 2 of 2 slices shown]
[im 1/2]
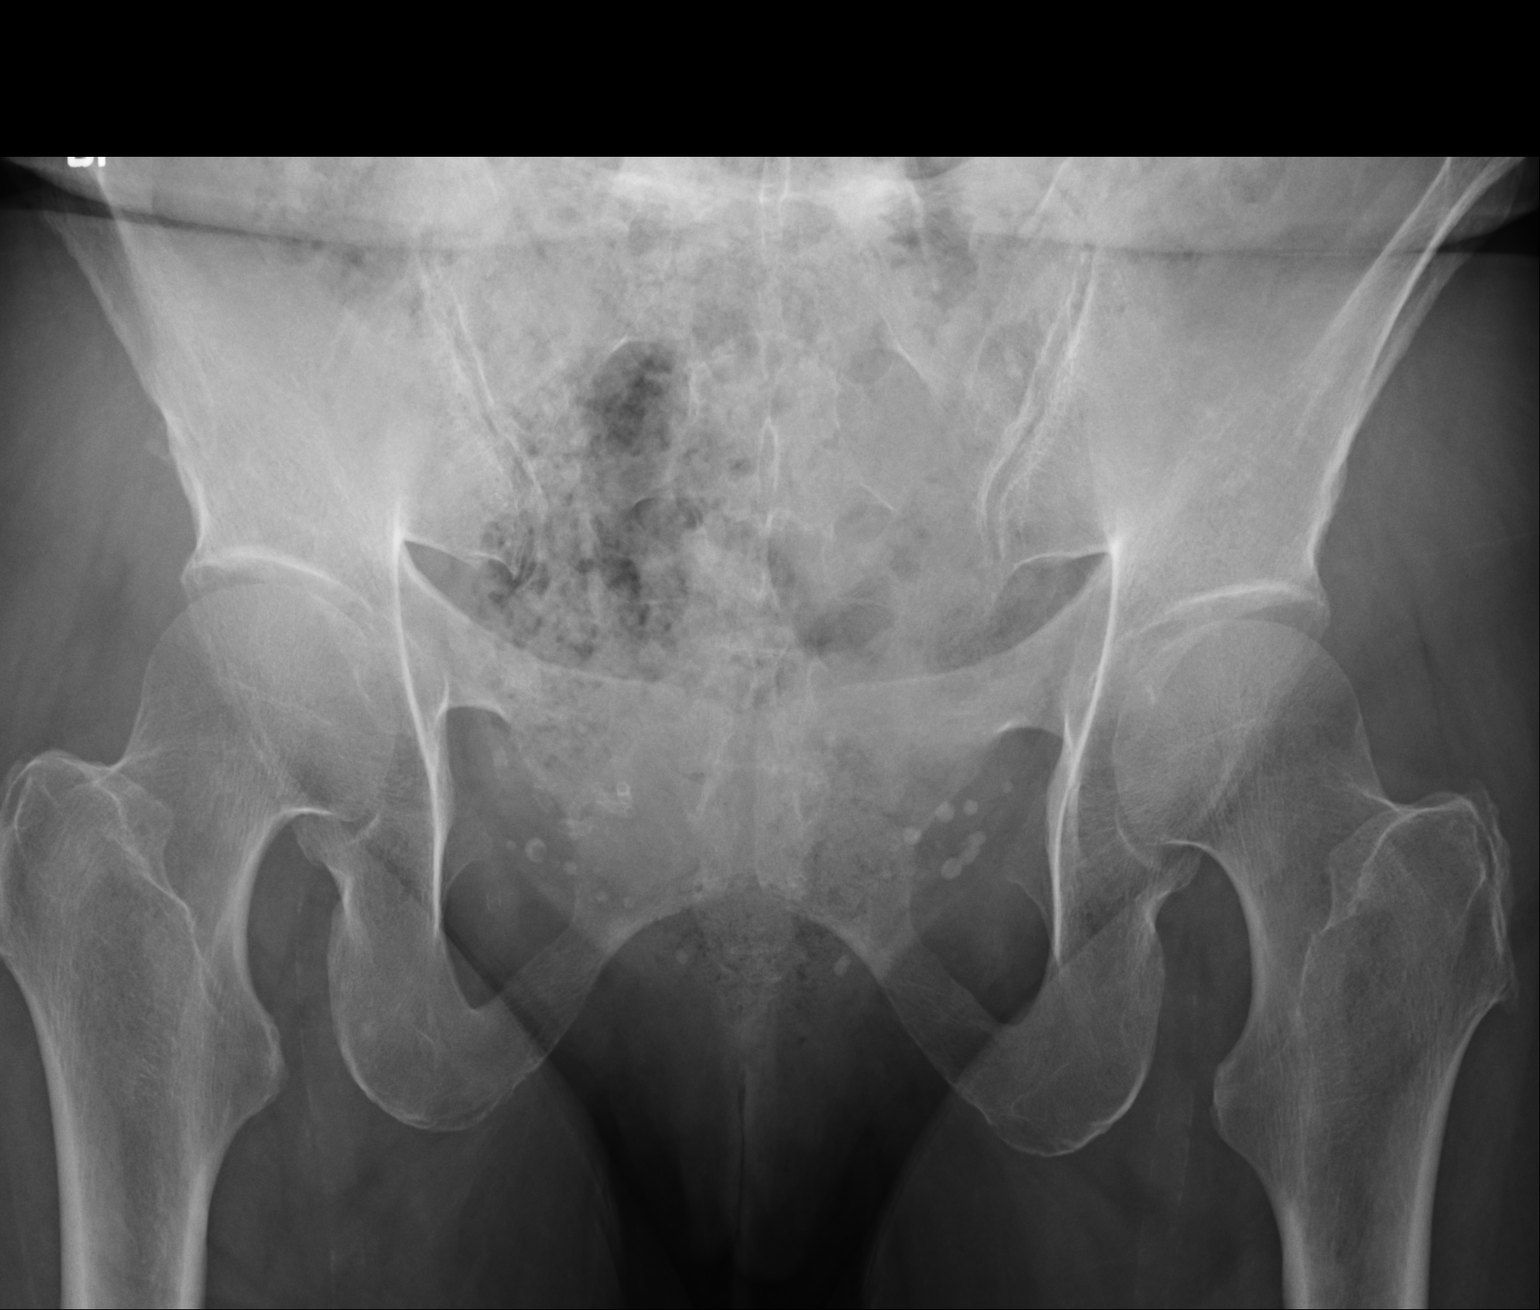
[im 2/2]
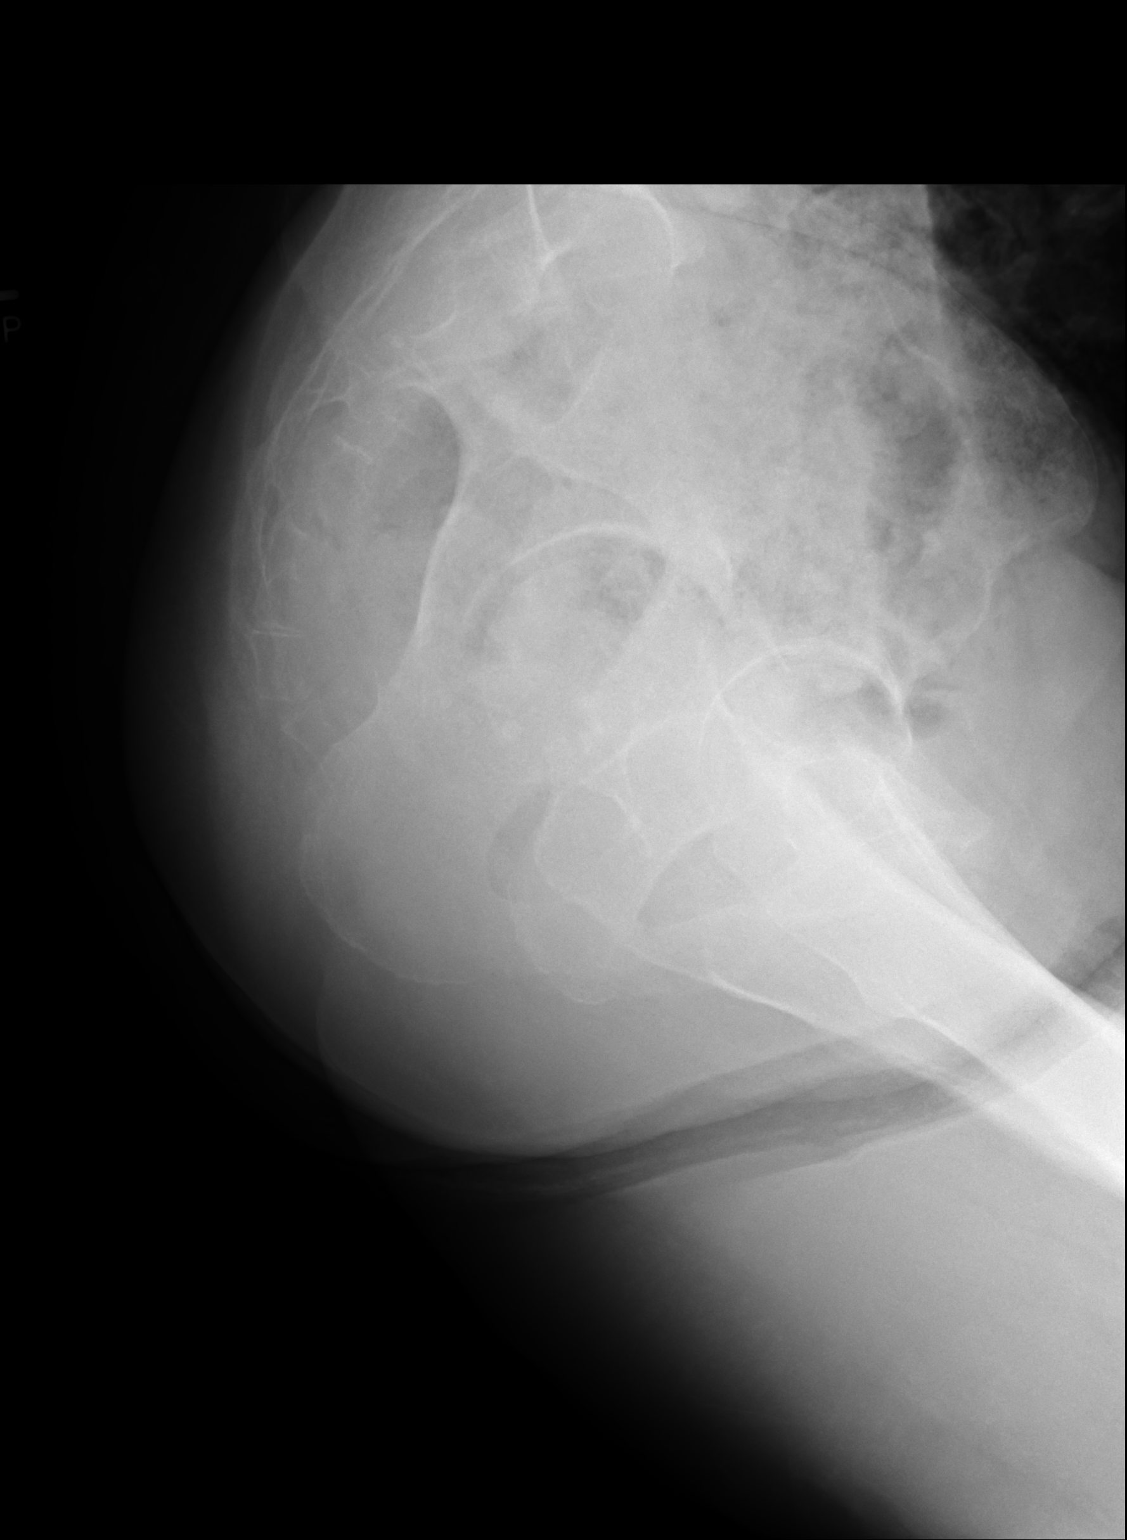

[ap axial coccyx tabl]
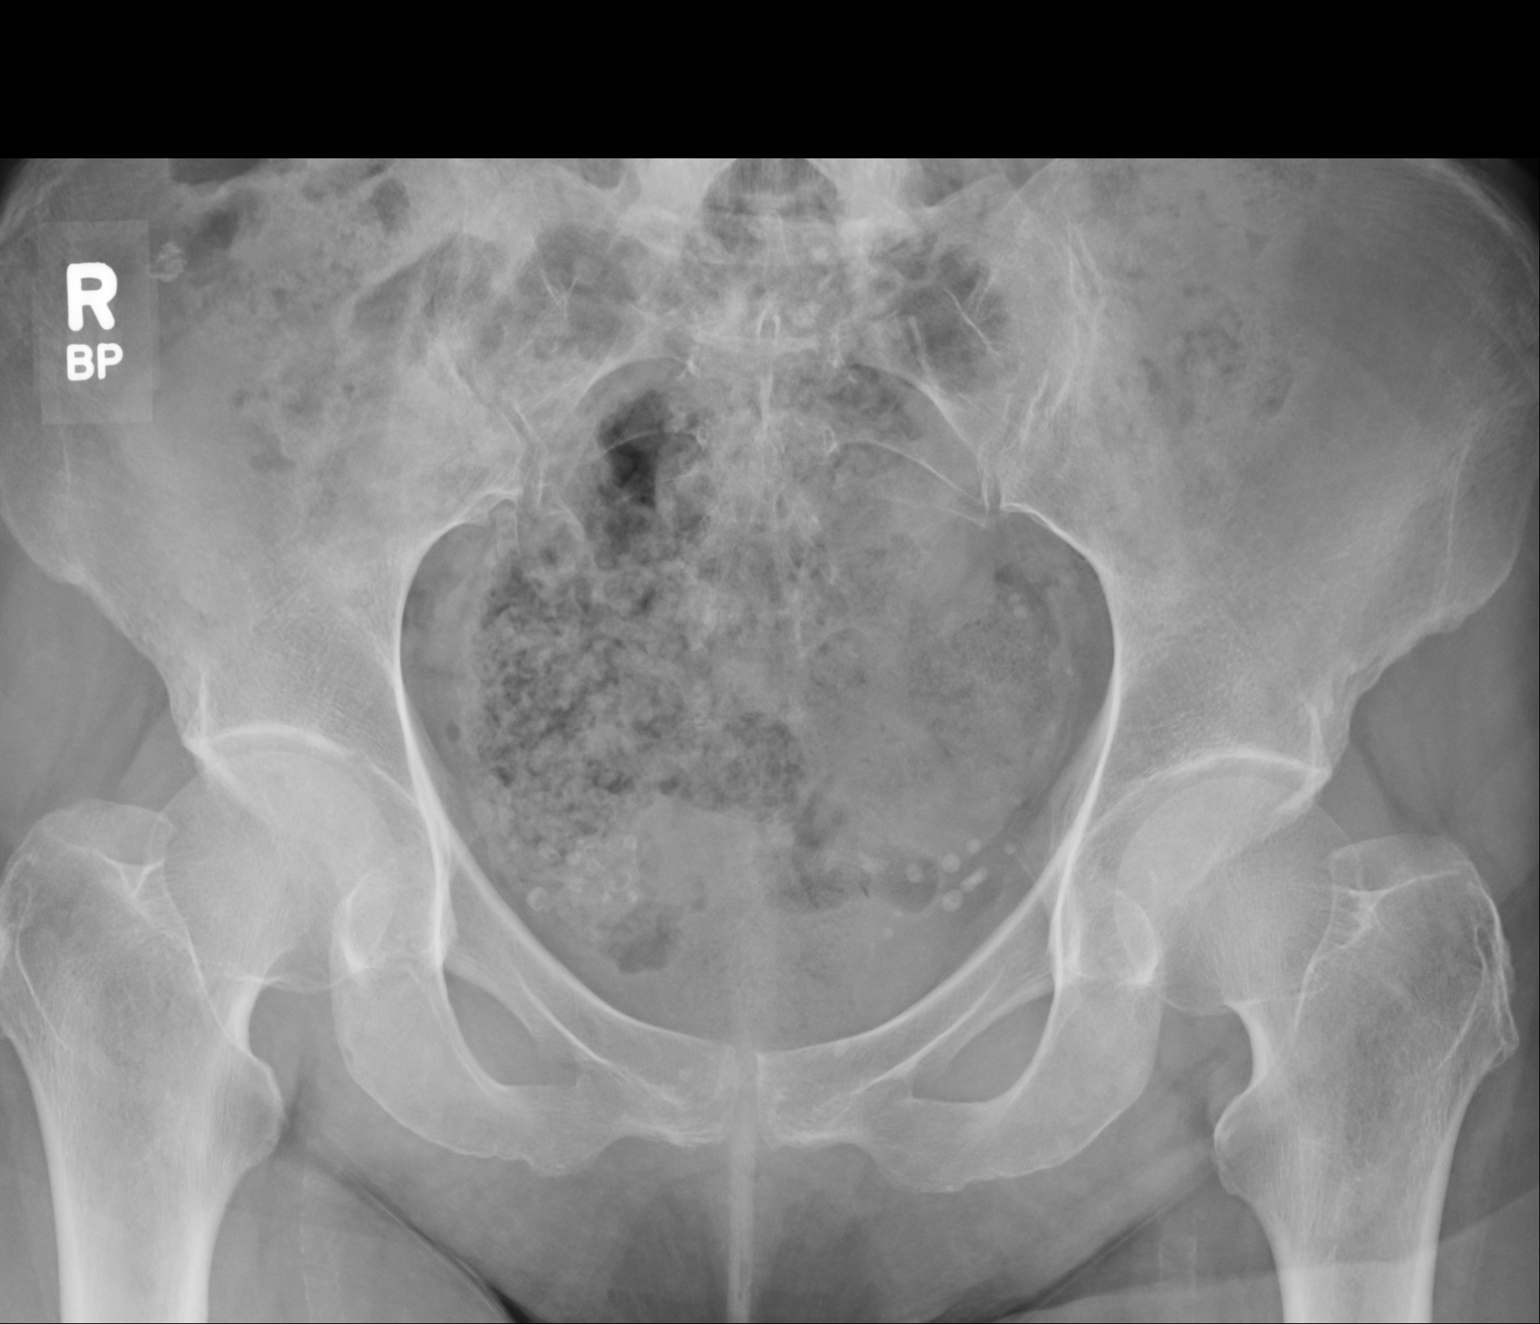

[3 of 3 positions shown; findings below may reference images not displayed]

FINDINGS: There is no evidence for acute fracture. Joint spaces and alignment             
 are within normal limits.
IMPRESSION: Negative.

## 2021-02-06 ENCOUNTER — Encounter: Payer: Self-pay | Admitting: Primary Care

## 2021-02-06 LAB — LIPID PANEL
Cholesterol: 182 mg/dL
Glucose: 99 mg/dL
HDL: 81 mg/dL
LDL Direct: 88 mg/dl
Triglycerides: 60 mg/dL

## 2021-02-06 NOTE — Progress Notes (Signed)
B

## 2021-05-26 ENCOUNTER — Encounter: Payer: Self-pay | Admitting: Primary Care

## 2021-05-26 DIAGNOSIS — R7309 Other abnormal glucose: Secondary | ICD-10-CM | POA: Insufficient documentation

## 2021-06-09 ENCOUNTER — Other Ambulatory Visit: Payer: Self-pay | Admitting: Medical

## 2021-06-25 ENCOUNTER — Ambulatory Visit: Payer: BLUE CROSS/BLUE SHIELD | Admitting: Primary Care

## 2021-06-25 DIAGNOSIS — R3129 Other microscopic hematuria: Secondary | ICD-10-CM

## 2021-06-25 DIAGNOSIS — M1711 Unilateral primary osteoarthritis, right knee: Secondary | ICD-10-CM

## 2021-06-25 DIAGNOSIS — Z0000 Encounter for general adult medical examination without abnormal findings: Secondary | ICD-10-CM

## 2021-06-25 DIAGNOSIS — E663 Overweight: Secondary | ICD-10-CM

## 2021-06-25 DIAGNOSIS — K219 Gastro-esophageal reflux disease without esophagitis: Secondary | ICD-10-CM

## 2021-06-25 DIAGNOSIS — G43909 Migraine, unspecified, not intractable, without status migrainosus: Secondary | ICD-10-CM

## 2021-06-25 DIAGNOSIS — R7309 Other abnormal glucose: Secondary | ICD-10-CM

## 2021-06-25 DIAGNOSIS — G4730 Sleep apnea, unspecified: Secondary | ICD-10-CM

## 2021-06-25 DIAGNOSIS — F32A Depression, unspecified: Secondary | ICD-10-CM

## 2021-06-25 DIAGNOSIS — I341 Nonrheumatic mitral (valve) prolapse: Secondary | ICD-10-CM

## 2021-06-25 DIAGNOSIS — Z79899 Other long term (current) drug therapy: Secondary | ICD-10-CM

## 2021-06-25 DIAGNOSIS — E559 Vitamin D deficiency, unspecified: Secondary | ICD-10-CM

## 2021-06-25 DIAGNOSIS — K449 Diaphragmatic hernia without obstruction or gangrene: Secondary | ICD-10-CM

## 2021-07-02 ENCOUNTER — Ambulatory Visit: Payer: No Typology Code available for payment source | Attending: Sleep Medicine | Admitting: Sleep Medicine

## 2021-07-02 DIAGNOSIS — G4733 Obstructive sleep apnea (adult) (pediatric): Secondary | ICD-10-CM | POA: Insufficient documentation

## 2021-07-02 DIAGNOSIS — G4700 Insomnia, unspecified: Secondary | ICD-10-CM | POA: Insufficient documentation

## 2021-07-02 NOTE — Progress Notes (Signed)
[facility] CPAP Follow Up Visit    Patient's Location:  home  Other Attendees:  none  Provider:  Roland Earl, NP  Location of Telemedicine Provider:  Home office      [name] is a 66 y.o. lady who presents today in follow up of CPAP therapy for the treatment of obstructive sleep apnea (OSA) and maintenance insomnia.    Presenting Symptoms/Sleep Testing:    [name] was initially seen at our office September 10, 2020 for evaluation of presence of obstructive sleep apnea (OSA)..  She completed a home sleep test on Oct 30, 2020 which demonstrated severe OSA (uAHI 34) and was prescribed auto CPAP.      I last saw her in July, when she reported doing well with CPAP but was struggling with maintenance insomnia, mainly due to anxiety surrounding her work.     CPAP Therapy/Interval Symptoms:    Today, [name] reports her sleep is very much the same as it was in July, when we last spoke. She continues to get about 4-6 hours of solid sleep , and is then awake and unable to fall back to sleep. Fortunately, she usually does not let this bother her and denies significant daytime sleepiness. She has tried several strategies to fall back to sleep once awake, but has not yet found something that consistently helps.     As for CPAP, her main complaint is discomfort and air leaks while side-sleeping. She abstained from use recently due to having the flu.     PAP INTERROGATION:         QUESTIONNAIRE:     Recent Review Flowsheet Data     Epworth Sleep Scale 06/30/2021    Sitting and reading 1    Watching TV 1    Sitting inactive in a public place (e.g a theater or a meeting) 0    As a passenger in a car for an hour without a break 1    Lying down to rest in the afternoon when circumstances permit 1    Sitting and talking to someone 0    Sitting quietly after a lunch without alcohol 1    In a car, while stopped for a few minutes in traffic 0    EPWORTH TOTAL  5        MEDICATIONS:     Current Outpatient  Medications   Medication Sig Note    buPROPion (WELLBUTRIN XL) 300 mg 24 hr tablet TAKE 1 TABLET (300 MG TOTAL) BY MOUTH EVERY MORNING, SWALLOW WHOLE DONT CRUSH BREAK OR CHEW     ALPRAZolam (XANAX) 0.5 mg tablet Take 0.5-1 tablets (0.25-0.5 mg total) by mouth nightly as needed for Anxiety  Max daily dose: 0.5 mg -Beware of sedation     frovatriptan (FROVA) 2.5 MG tablet Take 1 tablet (2.5 mg total) by mouth as needed for Migraine  If recurs, may repeat after 2 hours. Max of 3 tabs in 24 hours.     CPAP machine ([facility] [phone]) Dispense Auto CPAP 5-18 cmH2O, heated humidifier, climate line tubing & all needed supplies. Duration: Lifetime     amoxicillin (AMOXIL) 500 mg capsule Take 4 capsules 1 hour prior to dental procedure     famotidine (PEPCID) 40 MG tablet Take 40 mg by mouth 2 times daily     fluticasone (FLONASE) 50 MCG/ACT nasal spray Spray 1 spray into nostril 2 times daily     sucralfate (CARAFATE) 1 GM tablet Take 1 g  by mouth nightly      rizatriptan (MAXALT) 5 MG tablet Take 1 tablet (5 mg total) by mouth as needed for Migraine   May repeat in 2 hours if needed.  Max 6 tabs/day.     Calcium 500 MG tablet Take 1 tablet by mouth daily        Cholecalciferol (VITAMIN D3) 1000 UNIT tablet Take 1,000 Units by mouth every morning    06/25/2020: Taking 2,000 units     No current facility-administered medications for this visit.     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 Please see the EHR for full details.    IMPRESSION & PLAN:     1. Severe Obstructive Sleep Apnea (uAHI 34):  2. Maintenance insomnia    Interrogation of [name]'s CPAP unit reveals fair compliance with mild air leaks and effective treatment of sleep disordered breathing. She reports ongoing clinical benefit with CPAP therapy but struggles with comfortable side sleeping.    I advised her to try sleeping with a CPAP pillow, or with her current pillow at a "vertical" orientation to reduce pressure on her mask.     We talked again about cognitive behavioral therapy for insomnia (CBTi) and she will consider this. I advised her to continue to try not to stress over the idea of not sleeping enough, or waking overnight, as this tends to exacerbate the issue. For now, follow up will be via Mychart .     [name] will return as needed. She was instructed to contact our office with any questions or concerns.     Roland Earl, NP  [place]    An in-person visit with the patient was not advisable at this time due to COVID-19 concerns. In my professional opinion, my resulting orders would not have differed if an in-person consultation had been performed.    Consent was previously obtained from the patient to complete this telehealth consult; including the potential for financial liability. The plan was discussed with the patient and the patient/patient rep demonstrated understanding to the provider's satisfaction.    Overweight is defined as BMI > 28, obesity is defined as a BMI > 30. These terms are medical terms and not judgments about appearance        I personally spent 22 minutes on the calendar day of the encounter, including pre and post visit work.

## 2021-07-14 ENCOUNTER — Encounter: Payer: Self-pay | Admitting: Primary Care

## 2021-08-13 ENCOUNTER — Ambulatory Visit: Payer: No Typology Code available for payment source | Admitting: Primary Care

## 2021-08-13 DIAGNOSIS — M1711 Unilateral primary osteoarthritis, right knee: Secondary | ICD-10-CM

## 2021-08-13 DIAGNOSIS — Z79899 Other long term (current) drug therapy: Secondary | ICD-10-CM

## 2021-08-13 DIAGNOSIS — M1712 Unilateral primary osteoarthritis, left knee: Secondary | ICD-10-CM

## 2021-08-13 DIAGNOSIS — G43909 Migraine, unspecified, not intractable, without status migrainosus: Secondary | ICD-10-CM

## 2021-08-13 DIAGNOSIS — Z0000 Encounter for general adult medical examination without abnormal findings: Secondary | ICD-10-CM

## 2021-08-13 DIAGNOSIS — R7309 Other abnormal glucose: Secondary | ICD-10-CM

## 2021-08-13 DIAGNOSIS — K449 Diaphragmatic hernia without obstruction or gangrene: Secondary | ICD-10-CM

## 2021-08-13 DIAGNOSIS — I341 Nonrheumatic mitral (valve) prolapse: Secondary | ICD-10-CM

## 2021-08-13 DIAGNOSIS — R3129 Other microscopic hematuria: Secondary | ICD-10-CM

## 2021-08-13 DIAGNOSIS — G4730 Sleep apnea, unspecified: Secondary | ICD-10-CM

## 2021-08-13 DIAGNOSIS — K219 Gastro-esophageal reflux disease without esophagitis: Secondary | ICD-10-CM

## 2021-08-13 DIAGNOSIS — F32A Depression, unspecified: Secondary | ICD-10-CM

## 2021-08-13 DIAGNOSIS — E559 Vitamin D deficiency, unspecified: Secondary | ICD-10-CM

## 2021-08-13 DIAGNOSIS — E663 Overweight: Secondary | ICD-10-CM

## 2021-11-03 ENCOUNTER — Encounter: Payer: Self-pay | Admitting: Primary Care

## 2021-11-03 DIAGNOSIS — Z0000 Encounter for general adult medical examination without abnormal findings: Secondary | ICD-10-CM | POA: Insufficient documentation

## 2021-11-24 ENCOUNTER — Other Ambulatory Visit: Payer: Self-pay | Admitting: Primary Care

## 2021-12-01 ENCOUNTER — Other Ambulatory Visit
Admission: RE | Admit: 2021-12-01 | Discharge: 2021-12-01 | Disposition: A | Discharge status: Home / Self Care | Payer: No Typology Code available for payment source | Source: Ambulatory Visit | Attending: Primary Care | Admitting: Primary Care

## 2021-12-01 DIAGNOSIS — R7309 Other abnormal glucose: Secondary | ICD-10-CM | POA: Insufficient documentation

## 2021-12-01 DIAGNOSIS — F32A Depression, unspecified: Secondary | ICD-10-CM | POA: Insufficient documentation

## 2021-12-01 DIAGNOSIS — E559 Vitamin D deficiency, unspecified: Secondary | ICD-10-CM | POA: Insufficient documentation

## 2021-12-01 DIAGNOSIS — I341 Nonrheumatic mitral (valve) prolapse: Secondary | ICD-10-CM | POA: Insufficient documentation

## 2021-12-01 DIAGNOSIS — K449 Diaphragmatic hernia without obstruction or gangrene: Secondary | ICD-10-CM | POA: Insufficient documentation

## 2021-12-01 DIAGNOSIS — E663 Overweight: Secondary | ICD-10-CM | POA: Insufficient documentation

## 2021-12-01 DIAGNOSIS — Z79899 Other long term (current) drug therapy: Secondary | ICD-10-CM | POA: Insufficient documentation

## 2021-12-01 DIAGNOSIS — R3129 Other microscopic hematuria: Secondary | ICD-10-CM | POA: Insufficient documentation

## 2021-12-01 DIAGNOSIS — Z0000 Encounter for general adult medical examination without abnormal findings: Secondary | ICD-10-CM | POA: Insufficient documentation

## 2021-12-01 DIAGNOSIS — M1711 Unilateral primary osteoarthritis, right knee: Secondary | ICD-10-CM | POA: Insufficient documentation

## 2021-12-01 DIAGNOSIS — K219 Gastro-esophageal reflux disease without esophagitis: Secondary | ICD-10-CM | POA: Insufficient documentation

## 2021-12-01 DIAGNOSIS — G4730 Sleep apnea, unspecified: Secondary | ICD-10-CM | POA: Insufficient documentation

## 2021-12-01 DIAGNOSIS — G43909 Migraine, unspecified, not intractable, without status migrainosus: Secondary | ICD-10-CM | POA: Insufficient documentation

## 2021-12-01 DIAGNOSIS — M1712 Unilateral primary osteoarthritis, left knee: Secondary | ICD-10-CM | POA: Insufficient documentation

## 2021-12-01 LAB — URINALYSIS WITH MICROSCOPIC
Bacteria,UA: NONE SEEN
Blood,UA: NEGATIVE
Glucose,UA: NEGATIVE
Leuk Esterase,UA: NEGATIVE
Nitrite,UA: NEGATIVE
Specific Gravity,UA: 1.023 (ref 1.002–1.030)
pH,UA: 6 (ref 5.0–8.0)

## 2021-12-01 LAB — BASIC METABOLIC PANEL
Anion Gap: 11 (ref 7–16)
CO2: 27 mmol/L (ref 20–28)
Calcium: 9.3 mg/dL (ref 8.6–10.2)
Chloride: 101 mmol/L (ref 96–108)
Creatinine: 0.76 mg/dL (ref 0.51–0.95)
Glucose: 76 mg/dL (ref 60–99)
Lab: 15 mg/dL (ref 6–20)
Potassium: 4.3 mmol/L (ref 3.3–5.1)
Sodium: 139 mmol/L (ref 133–145)
eGFR BY CREAT: 88 *

## 2021-12-01 LAB — CBC AND DIFFERENTIAL
Baso # K/uL: 0 10*3/uL (ref 0.0–0.1)
Basophil %: 0.5 %
Eos # K/uL: 0.1 10*3/uL (ref 0.0–0.4)
Eosinophil %: 1.7 %
Hematocrit: 41 % (ref 34–45)
Hemoglobin: 12.8 g/dL (ref 11.2–15.7)
IMM Granulocytes #: 0 10*3/uL (ref 0.0–0.0)
IMM Granulocytes: 0.3 %
Lymph # K/uL: 1.7 10*3/uL (ref 1.2–3.7)
Lymphocyte %: 26.6 %
MCH: 27 pg (ref 26–32)
MCHC: 31 g/dL — ABNORMAL LOW (ref 32–36)
MCV: 86 fL (ref 79–95)
Mono # K/uL: 0.6 10*3/uL (ref 0.2–0.9)
Monocyte %: 10 %
Neut # K/uL: 3.9 10*3/uL (ref 1.6–6.1)
Nucl RBC # K/uL: 0 10*3/uL (ref 0.0–0.0)
Nucl RBC %: 0 /100 WBC (ref 0.0–0.2)
Platelets: 183 10*3/uL (ref 160–370)
RBC: 4.8 MIL/uL (ref 3.9–5.2)
RDW: 15.3 % — ABNORMAL HIGH (ref 11.7–14.4)
Seg Neut %: 60.9 %
WBC: 6.4 10*3/uL (ref 4.0–10.0)

## 2021-12-01 LAB — ALT: ALT: 19 U/L (ref 0–35)

## 2021-12-01 LAB — LIPID PANEL
Chol/HDL Ratio: 2.6
Cholesterol: 214 mg/dL — AB
HDL: 81 mg/dL — ABNORMAL HIGH (ref 40–60)
LDL Calculated: 122 mg/dL
Non HDL Cholesterol: 133 mg/dL
Triglycerides: 56 mg/dL

## 2021-12-01 LAB — AST: AST: 18 U/L (ref 0–35)

## 2021-12-01 LAB — HEMOGLOBIN A1C: Hemoglobin A1C: 5.9 % — ABNORMAL HIGH

## 2021-12-01 LAB — VITAMIN D: 25-OH Vit Total: 40 ng/mL (ref 30–60)

## 2021-12-03 ENCOUNTER — Other Ambulatory Visit
Admission: RE | Admit: 2021-12-03 | Discharge: 2021-12-03 | Disposition: A | Discharge status: Home / Self Care | Payer: No Typology Code available for payment source | Source: Ambulatory Visit | Attending: Obstetrics and Gynecology | Admitting: Obstetrics and Gynecology

## 2021-12-03 ENCOUNTER — Other Ambulatory Visit: Payer: Self-pay | Admitting: Gastroenterology

## 2021-12-03 ENCOUNTER — Other Ambulatory Visit: Payer: Self-pay

## 2021-12-03 ENCOUNTER — Ambulatory Visit: Payer: No Typology Code available for payment source | Admitting: Primary Care

## 2021-12-03 VITALS — BP 132/82 | HR 68 | Temp 98.4°F | Ht 64.5 in | Wt 238.0 lb

## 2021-12-03 DIAGNOSIS — F32A Depression, unspecified: Secondary | ICD-10-CM

## 2021-12-03 DIAGNOSIS — M1711 Unilateral primary osteoarthritis, right knee: Secondary | ICD-10-CM

## 2021-12-03 DIAGNOSIS — I341 Nonrheumatic mitral (valve) prolapse: Secondary | ICD-10-CM

## 2021-12-03 DIAGNOSIS — M1712 Unilateral primary osteoarthritis, left knee: Secondary | ICD-10-CM

## 2021-12-03 DIAGNOSIS — G4730 Sleep apnea, unspecified: Secondary | ICD-10-CM

## 2021-12-03 DIAGNOSIS — Z124 Encounter for screening for malignant neoplasm of cervix: Secondary | ICD-10-CM | POA: Insufficient documentation

## 2021-12-03 DIAGNOSIS — C44611 Basal cell carcinoma of skin of unspecified upper limb, including shoulder: Secondary | ICD-10-CM

## 2021-12-03 DIAGNOSIS — G43909 Migraine, unspecified, not intractable, without status migrainosus: Secondary | ICD-10-CM

## 2021-12-03 DIAGNOSIS — K449 Diaphragmatic hernia without obstruction or gangrene: Secondary | ICD-10-CM

## 2021-12-03 DIAGNOSIS — E559 Vitamin D deficiency, unspecified: Secondary | ICD-10-CM

## 2021-12-03 DIAGNOSIS — R7309 Other abnormal glucose: Secondary | ICD-10-CM

## 2021-12-03 DIAGNOSIS — Z0000 Encounter for general adult medical examination without abnormal findings: Secondary | ICD-10-CM

## 2021-12-03 DIAGNOSIS — Z79899 Other long term (current) drug therapy: Secondary | ICD-10-CM

## 2021-12-03 DIAGNOSIS — R3129 Other microscopic hematuria: Secondary | ICD-10-CM

## 2021-12-03 DIAGNOSIS — K219 Gastro-esophageal reflux disease without esophagitis: Secondary | ICD-10-CM

## 2021-12-03 DIAGNOSIS — E663 Overweight: Secondary | ICD-10-CM

## 2021-12-03 LAB — PCMH DEPRESSION ASSESSMENT

## 2021-12-03 NOTE — Progress Notes (Signed)
Chief complaint:Pleasant female seen here for physical as well as review of multiple issues we reviewed together family history past medical history medications allergies and immunizations.  Recent lab work reviewed together.         HPI: Pleasant woman seen here for physical without any acute concerns.  She and her husband moved to [place] to be near her daughter and grandkids but she is still working for [employer] remotely husband has fully retired and has a new PCP there    History of sleep apnea diagnosed couple years ago using CPAP regularly    Migraine headaches have been better rare use of Frova    Mood has been good overall on current medications happy    Weight unfortunately has not improved but fairly steady despite trying to watch diet and exercising 3 days a week    Vitamin D deficiency level good on supplement    Left knee replacement doing okay just cannot kneel on it and right knee arthritis does not bother her    Heartburn controlled with the famotidine and nightly Carafate    Glucose in the normal range but hemoglobin A1c of 5.9 encouraged weight loss as I reviewed that should improve things.    Patient states she is up-to-date on DEXA scan and mammogram was done today saw GYN today    Patient has a new ophthalmologist and dentist in [place]    Rhinitis has fluticasone nasal spray.    Current Outpatient Medications   Medication Sig Dispense Refill   . buPROPion (WELLBUTRIN XL) 300 mg 24 hr tablet TAKE 1 TABLET (300 MG TOTAL) BY MOUTH EVERY MORNING, SWALLOW WHOLE DONT CRUSH BREAK OR CHEW 30 tablet 5   . CPAP machine ([facility] [phone]) Dispense Auto CPAP 5-18 cmH2O, heated humidifier, climate line tubing & all needed supplies. Duration: Lifetime 1 each 0   . famotidine (PEPCID) 40 MG tablet Take 1 tablet (40 mg total) by mouth 2 times daily     . sucralfate (CARAFATE) 1 GM tablet Take 1 tablet (1 g total) by mouth nightly     . Calcium 500 MG tablet Take 1 tablet by mouth daily        . Cholecalciferol  (VITAMIN D3) 1000 UNIT tablet Take 1 tablet (1,000 units total) by mouth every morning     . ALPRAZolam (XANAX) 0.5 mg tablet Take 0.5-1 tablets (0.25-0.5 mg total) by mouth nightly as needed for Anxiety  Max daily dose: 0.5 mg -Beware of sedation 30 tablet 0   . frovatriptan (FROVA) 2.5 MG tablet Take 1 tablet (2.5 mg total) by mouth as needed for Migraine  If recurs, may repeat after 2 hours. Max of 3 tabs in 24 hours. 10 tablet 3   . amoxicillin (AMOXIL) 500 mg capsule Take 4 capsules 1 hour prior to dental procedure 20 capsule 0   . fluticasone (FLONASE) 50 MCG/ACT nasal spray Spray 1 spray into nostril 2 times daily 48 g 3   . rizatriptan (MAXALT) 5 MG tablet Take 1 tablet (5 mg total) by mouth as needed for Migraine   May repeat in 2 hours if needed.  Max 6 tabs/day. 10 tablet 3     No current facility-administered medications for this visit.        Patient Active Problem List    Diagnosis Date Noted   . Annual physical exam 11/03/2021   . Elevated glucose 05/26/2021   . Microhematuria 07/14/2019   . Current use of proton pump inhibitor 06/04/2019   .  Hiatal hernia 12/12/2018   . Chronic GERD 12/12/2018   . Osteoarthritis of right knee 04/28/2017   . 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 Tear of medial cartilage or meniscus of knee, current 12/03/2010   . Depression 11/07/2010   . Vitamin D deficiency 07/23/2010   . Sleep apnea 02/22/2007     [facility]: Mar 15 2007  9:05PM - Charlcie Prisco, [place]: dr [place] --not on cpap       . Overweight 04/05/2003            . Migraine headache 04/05/2003            . Mitral valve prolapse 04/05/2003                 Past Medical History:   Diagnosis Date   . Abdominal pain 07/19/2012   . Anxiety 3/12   . Benign neoplasm of colon    . Calcaneal bursitis 01/14/2012    right   . Chronic cholecystitis 10/2012   . Chronic GERD 12/12/2018   . Complication of anesthesia     PONV   . Current use of proton pump inhibitor 06/04/2019   . Depression    . Diverticulosis 11/27/2013    mild   .  Hiatal hernia 12/12/2018   . Internal hemorrhoids    . Microhematuria 07/14/2019   . Migraine     q2-3 months   . Migraine headache 04/05/2003         . Mitral valve prolapse    . OA (osteoarthritis)     left knee   . Osteoarthritis of right knee 04/28/2017   . Osteoarthritis, knee 12/03/2010   . PONV (postoperative nausea and vomiting)    . Primary osteoarthritis of left knee 08/26/2015   . Rosacea 04/05/2003         . 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 Sleep apnea     does not use CPAP   . Syncope     d/t migraines   . Urinary tract infection     frequent  3 this year   . Vertigo    . Vitamin D deficiency 07/23/2010        Past Surgical History:   Procedure Laterality Date   . CESAREAN SECTION, CLASSIC  '86 '88    spinal   . CHOLECYSTECTOMY, LAPAROSCOPIC  11/16/2012   . COLONOSCOPY  2012   . COLONOSCOPY  11/24/2013    10/2018   . COLONOSCOPY  05/19/2019    repeat 04/2024   . KNEE ARTHROSCOPY Left 06/19/11   . strabismus correction Right 1972, 1993    x2--child + 33 yo   . WISDOM TOOTH EXTRACTION  1978    4 wisdom and B/L bicuspids        Family History   Problem Relation Age of Onset   . Breast cancer Mother    . Depression Mother    . Stroke Father    . Heart failure Father    . Heart Disease Father    . Breast cancer Sister    . Heart Disease Brother    . Asthma Sister    . Ovarian cancer Maternal Grandmother    . Colon cancer Maternal Grandfather    . Depression Sister    . Depression Brother    . Depression Daughter    . Stroke Paternal Grandmother    . Stroke Paternal Grandfather    . Substance abuse Sister  Drug - past   . Substance abuse Brother         Alcohol - current       Review of systems:    Generally feeling:  Well  Feeling tired:  No  Fever:  No  Chills:  No  Recent weight change:  No  Headache:  Less often and frova will work  Vision problems:  No  Hearing loss:  No  Earache:  No  Ringing in the ears (tinnitus):  No  Nasal symptoms:  No  Sore throat:  No  Chest pain:  No  Palpitations:  No  Leg  pain with exercise (claudication):  No  Shortness of breath:  No  Cough:  No   wheezing:  No  Change in appetite:  No  Heartburn:  controlled  Nausea:  No  Vomiting:  No  Abdominal pain:  No  Change in the stool:  No  Diarrhea:  No  Constipation:  Mild chronic, can try fiber and miralax  Blood in  urine:  No  Increased urinary frequency:  No  Pain during urination:  No  Excessive thirst or fluid intake:  No  Excessive sweating:  No  Easy bleeding:  No  Easy bruising tendency:  No  Back pain:  No  Generalized muscle aches:  No  Localized joint pain:  No  Dizziness:  No  Lightheadedness:  No  Fainting:  No  Memory loss:  No  Seizures:  No  Anxiety:  No  Depression:  No  Itching:  No  Skin rash:  No  Skin lesion:  No    Social history extended    Caffeine RUE:AVWU  Tobacco JWJ:XBJYN  Alcohol use:<1 a month  Drug use: none  Sleep habits:fair  Sunscreen WGN:FAOZ  Ophthalmology:good  Dental hygiene:good  Dentist:good  Exercise habits:3 days a week--goal 40 min most days  Work history: fulltime [employer]  Marital history:married        Vitals:    12/03/21 1322   BP: 132/82   Pulse:    Temp:    Weight:    Height:      Body mass index is 40.22 kg/m.    Physical exam:    General appearance:    Well-developed, well groomed.  Appears stated age.  No acute distress.  Color good.  Mental status:  Appears alert and oriented.  Affect appropriate.  Skin:  Skin color and turgor normal.  No suspicious lesions, masses, rashes, or ulcerations.    Head:  Normocephalic  Ears:  External ears without scars, masses, or lesions.  External auditory canal intact, clear, and without lesions.  TMs intact with normal light reflex and landmarks.  Acuity to conversational tones good.  Eyes:extraocular movements intact.  Lids without defect, conjunctiva and sclera appear normal.   Nose:  Nasal mucosa and turbinates pink, septum midline, no lesions.  Mouth:  Teeth in good repair.  Gums pink without lesions.  Normal appearing mucosa, palate, and  tongue.  Oropharynx:  Moist without exudate, erythema, or swelling.  Neck:  Symmetric, trachea midline.  Thyroid nontender without enlargement or masses.  Carotids with no bruits bilaterally.  No cervical lymphadenopathy.  Breasts: Deferred by patient to GYN whom she just saw today  Chest:  Respirations unlabored.  Chest was symmetric with no masses.  Breath sounds clear bilaterally without wheezes, rubs, rales, or rhonchi.  CV:  Normal S1 and S2 without murmur, rub, gallop, or click.  No edema, clubbing, or cyanosis.  Dorsalis pedis pulses full and symmetrical.  Abdomen:  Abdomen soft with normal bowel sounds.  No guarding or rebound.  No palpable masses or tenderness.  Liver without tenderness or enlargement.  No aortic widening.  GU and rectal deferred to GYN  Musculoskeletal:  Muscle tone and strength normal for age, without atrophy or abnormal movements.  Extremities:  Joints with full range of motion, without tenderness, crepitus, or contracture.  No obvious joint deformities or effusions.  Neuro:  Cranial nerves II through XII grossly intact.  Motor strength symmetrical without obvious weakness.  Superficial sensation intact bilaterally to light touch.  Observed dexterity without ataxia or tremor.  Deep tendon reflexes 1+ and symmetric bilaterally.         Assessment and plan:         Pleasant female seen here for physical and review of several issues.  Sleep apnea continue CPAP    Migraine headaches continue Frova as needed    Mood continue bupropion    Weight continue efforts at regular aerobic exercise decrease calorie intake.  Once retires which might be this January encouraged more regular exercise    Vitamin D deficiency continue supplement    GERD continue famotidine and sucralfate    Cholesterol reasonable with only a 3.6% 10-year risk of cardiovascular event and had stress test 2 years ago    Rhinitis continue fluticasone nasal spray    Left knee replacement doing well no issues and right knee  arthritis not symptomatic at this time      Counseling and education:      Healthcare proxy:  Lab results:  Diet and bodyweight discussed  Aerobic exercise discussed  Smoking cessation discussed  Alcohol use discussed  Recreational drug use discussed  Colon cancer screening discussed  Ob/Gyn care discussed  Osteoporosis prevention and screening discussed  Breast self-exam discussed  Mammogram discussed  HPV vaccine for women up to age 8 discussed  STD risk counseling and prevention discussed  Zostavax vaccine discussed  Skin cancer awareness and prevention discussed  Cardiac risk factor modification discussed  Motor vehicle safety discussed  Next H&P:

## 2021-12-13 LAB — GYN CYTOLOGY

## 2022-01-02 ENCOUNTER — Encounter: Payer: Self-pay | Admitting: Sleep Medicine

## 2022-01-02 NOTE — Telephone Encounter (Signed)
CPAP data shows no usage in at least the past 30 nights  Graciella Freer, NP

## 2022-02-26 ENCOUNTER — Telehealth: Payer: Self-pay | Admitting: Primary Care

## 2022-02-26 MED ORDER — NIRMATRELVIR & RITONAVIR 150-100 MG PO TABS *I*
ORAL_TABLET | ORAL | 0 refills | Status: DC
Start: 2022-02-26 — End: 2022-02-26

## 2022-02-26 MED ORDER — NIRMATRELVIR & RITONAVIR 150-100 MG PO TABS *I*
ORAL_TABLET | ORAL | 0 refills | Status: AC
Start: 2022-02-26 — End: 2022-03-03

## 2022-02-26 NOTE — Telephone Encounter (Signed)
Call from patient they tested positive for COVID on 8/31 with an at home test.  Symptoms started on 8/30.    Symptoms to include: body aches, fever, congestion, cough    Denies the following symptoms: SOB    Vaccinated? yes    Treating with: dayquil and tylenol    Requesting: patient would like to know if she is able to get paxlovid prescribed to her

## 2022-02-26 NOTE — Telephone Encounter (Signed)
I called [name] and let her know to hold the frova and the sucralfate while on the paxlovid.  She understands this.  It looks like the transmission failed to send-I pended it again for you

## 2022-02-26 NOTE — Telephone Encounter (Signed)
Please let her know I will place the Paxlovid prescription.  I recommend she not take the Frova while on the Paxlovid and also hold the Carafate those days as well.  I was not able to confirm whether or not there is any drug interaction with the Carafate or Frova.

## 2022-02-26 NOTE — Telephone Encounter (Signed)
I called and spoke with [name], her temp has gotten to like 99.8, tylenol brought it down.  She has some nasal and throat congestion, body aches.  Symptoms started on 8/30.  I reviewed isolation guidelines with him.      No SOB or wheezing    She would like Paxlovid.  She is in [place], her husband also has COVID and is being treated with it.  We reviewed side effects and risk of rebound COVID.  She doesn't want to wait a few days and see how things go, she would like to get started on it now.  I confirmed her pharmacy    GFR is 88

## 2022-02-26 NOTE — Telephone Encounter (Signed)
Transmission failed again, I call [facility] and spoke with pharmacist, was going to give verbal order but they do have the rx.  All set

## 2022-05-19 ENCOUNTER — Other Ambulatory Visit: Payer: Self-pay | Admitting: Primary Care

## 2022-08-10 ENCOUNTER — Other Ambulatory Visit: Payer: Self-pay | Admitting: Primary Care
